# Patient Record
Sex: Male | Born: 1975 | Race: White | Hispanic: No | Marital: Married | Smoking: Current every day smoker
Health system: Southern US, Community
[De-identification: ages and names within clinical notes are randomized; demographics above are authoritative.]

## PROBLEM LIST (undated history)

## (undated) ENCOUNTER — Emergency Department (HOSPITAL_BASED_OUTPATIENT_CLINIC_OR_DEPARTMENT_OTHER): Payer: BLUE CROSS/BLUE SHIELD | Source: Home / Self Care

## (undated) DIAGNOSIS — K219 Gastro-esophageal reflux disease without esophagitis: Secondary | ICD-10-CM

## (undated) DIAGNOSIS — I10 Essential (primary) hypertension: Secondary | ICD-10-CM

## (undated) DIAGNOSIS — I471 Supraventricular tachycardia, unspecified: Secondary | ICD-10-CM

## (undated) DIAGNOSIS — I4891 Unspecified atrial fibrillation: Secondary | ICD-10-CM

## (undated) DIAGNOSIS — E119 Type 2 diabetes mellitus without complications: Secondary | ICD-10-CM

## (undated) DIAGNOSIS — IMO0001 Reserved for inherently not codable concepts without codable children: Secondary | ICD-10-CM

## (undated) DIAGNOSIS — N419 Inflammatory disease of prostate, unspecified: Secondary | ICD-10-CM

## (undated) DIAGNOSIS — Z87448 Personal history of other diseases of urinary system: Secondary | ICD-10-CM

## (undated) HISTORY — PX: TONSILLECTOMY: SUR1361

## (undated) HISTORY — PX: WISDOM TOOTH EXTRACTION: SHX21

## (undated) HISTORY — PX: KNEE SURGERY: SHX244

## (undated) HISTORY — PX: ADENOIDECTOMY: SUR15

## (undated) HISTORY — PX: URETER SURGERY: SHX823

---

## 2000-08-31 ENCOUNTER — Emergency Department (HOSPITAL_COMMUNITY): Admission: EM | Admit: 2000-08-31 | Discharge: 2000-08-31 | Payer: Self-pay | Admitting: Emergency Medicine

## 2000-08-31 ENCOUNTER — Encounter: Payer: Self-pay | Admitting: Emergency Medicine

## 2000-09-15 ENCOUNTER — Emergency Department (HOSPITAL_COMMUNITY): Admission: EM | Admit: 2000-09-15 | Discharge: 2000-09-15 | Payer: Self-pay | Admitting: Emergency Medicine

## 2000-10-23 ENCOUNTER — Emergency Department (HOSPITAL_COMMUNITY): Admission: EM | Admit: 2000-10-23 | Discharge: 2000-10-23 | Payer: Self-pay | Admitting: Internal Medicine

## 2000-10-23 ENCOUNTER — Encounter: Payer: Self-pay | Admitting: Internal Medicine

## 2012-02-14 ENCOUNTER — Encounter (HOSPITAL_BASED_OUTPATIENT_CLINIC_OR_DEPARTMENT_OTHER): Payer: Self-pay | Admitting: *Deleted

## 2012-02-14 ENCOUNTER — Emergency Department (HOSPITAL_BASED_OUTPATIENT_CLINIC_OR_DEPARTMENT_OTHER)
Admission: EM | Admit: 2012-02-14 | Discharge: 2012-02-14 | Disposition: A | Discharge status: Home / Self Care | Payer: Self-pay | Attending: Emergency Medicine | Admitting: Emergency Medicine

## 2012-02-14 DIAGNOSIS — Z880 Allergy status to penicillin: Secondary | ICD-10-CM | POA: Insufficient documentation

## 2012-02-14 DIAGNOSIS — K0889 Other specified disorders of teeth and supporting structures: Secondary | ICD-10-CM

## 2012-02-14 DIAGNOSIS — F17200 Nicotine dependence, unspecified, uncomplicated: Secondary | ICD-10-CM | POA: Insufficient documentation

## 2012-02-14 DIAGNOSIS — K089 Disorder of teeth and supporting structures, unspecified: Secondary | ICD-10-CM | POA: Insufficient documentation

## 2012-02-14 DIAGNOSIS — E119 Type 2 diabetes mellitus without complications: Secondary | ICD-10-CM | POA: Insufficient documentation

## 2012-02-14 DIAGNOSIS — R51 Headache: Secondary | ICD-10-CM | POA: Insufficient documentation

## 2012-02-14 MED ORDER — METHOCARBAMOL 500 MG PO TABS: 500.0000 mg | ORAL_TABLET | Freq: Two times a day (BID) | ORAL | Status: AC

## 2012-02-14 MED ORDER — OXYCODONE-ACETAMINOPHEN 5-325 MG PO TABS: 2.0000 | ORAL_TABLET | ORAL | Status: AC | PRN

## 2012-02-14 NOTE — ED Provider Notes (Signed)
History     CSN: [id number]  Arrival date & time 02/14/12  6578   First MD Initiated Contact with Patient 02/14/12 [phone]      Chief Complaint  Patient presents with  . Headache  . Dental Pain    (Consider location/radiation/quality/duration/timing/severity/associated sxs/prior treatment) Patient is a 36 y.o. male presenting with headaches and tooth pain. The history is provided by the patient and the spouse.  Headache   Dental PainThe primary symptoms include headaches.   in here complaining of pain to the right side of his face after being hit with a softball. No loss of consciousness no vomiting. Has not appropriately. Denies any neck pain complains of sharp pain at his right upper third molar which is worse with mastication. Using over-the-counter medications without resolve does have a history of poor dentition at that tooth as well  Past Medical History  Diagnosis Date  . Diabetes mellitus     History reviewed. No pertinent past surgical history.  History reviewed. No pertinent family history.  History  Substance Use Topics  . Smoking status: Current Everyday Smoker  . Smokeless tobacco: Not on file  . Alcohol Use: No      Review of Systems  Neurological: Positive for headaches.  All other systems reviewed and are negative.    Allergies  Penicillins  Home Medications  No current outpatient prescriptions on file.  BP 115/75  Pulse 92  Temp 97.6 F (36.4 C) (Oral)  Resp 20  Ht 5\' 9"  (1.753 m)  Wt 215 lb (97.523 kg)  BMI 31.75 kg/m2  SpO2 100%  Physical Exam  Nursing note and vitals reviewed. Constitutional: He is oriented to person, place, and time. He appears well-developed and well-nourished.  Non-toxic appearance. No distress.  HENT:  Head: Normocephalic and atraumatic.  Mouth/Throat: Dental caries present. No dental abscesses.  Eyes: Conjunctivae, EOM and lids are normal. Pupils are equal, round, and reactive to light.  Neck: Normal range of  motion. Neck supple. No tracheal deviation present. No mass present.  Cardiovascular: Normal rate, regular rhythm and normal heart sounds.  Exam reveals no gallop.   No murmur heard. Pulmonary/Chest: Effort normal and breath sounds normal. No stridor. No respiratory distress. He has no decreased breath sounds. He has no wheezes. He has no rhonchi. He has no rales.  Abdominal: Soft. Normal appearance and bowel sounds are normal. He exhibits no distension. There is no tenderness. There is no rebound and no CVA tenderness.  Musculoskeletal: Normal range of motion. He exhibits no edema and no tenderness.  Neurological: He is alert and oriented to person, place, and time. He has normal strength. No cranial nerve deficit or sensory deficit. GCS eye subscore is 4. GCS verbal subscore is 5. GCS motor subscore is 6.  Skin: Skin is warm and dry. No abrasion and no rash noted.  Psychiatric: He has a normal mood and affect. His speech is normal and behavior is normal.    ED Course  Procedures (including critical care time)  Labs Reviewed - No data to display No results found.   No diagnosis found.    MDM  Will tx for dental pain--no need for xrays        Toy Baker, MD 02/14/12 0930

## 2012-02-14 NOTE — ED Notes (Signed)
Hit in right side of face with softball last night states broke one of upper right molars and has had headache and facial pain with some swelling since then

## 2012-03-12 ENCOUNTER — Emergency Department (HOSPITAL_BASED_OUTPATIENT_CLINIC_OR_DEPARTMENT_OTHER)
Admission: EM | Admit: 2012-03-12 | Discharge: 2012-03-12 | Disposition: A | Discharge status: Home / Self Care | Payer: Self-pay | Attending: Emergency Medicine | Admitting: Emergency Medicine

## 2012-03-12 ENCOUNTER — Emergency Department (HOSPITAL_BASED_OUTPATIENT_CLINIC_OR_DEPARTMENT_OTHER): Payer: Self-pay

## 2012-03-12 ENCOUNTER — Encounter (HOSPITAL_BASED_OUTPATIENT_CLINIC_OR_DEPARTMENT_OTHER): Payer: Self-pay | Admitting: *Deleted

## 2012-03-12 DIAGNOSIS — M5430 Sciatica, unspecified side: Secondary | ICD-10-CM | POA: Insufficient documentation

## 2012-03-12 DIAGNOSIS — Z880 Allergy status to penicillin: Secondary | ICD-10-CM | POA: Insufficient documentation

## 2012-03-12 DIAGNOSIS — Z881 Allergy status to other antibiotic agents status: Secondary | ICD-10-CM | POA: Insufficient documentation

## 2012-03-12 DIAGNOSIS — E119 Type 2 diabetes mellitus without complications: Secondary | ICD-10-CM | POA: Insufficient documentation

## 2012-03-12 DIAGNOSIS — F17200 Nicotine dependence, unspecified, uncomplicated: Secondary | ICD-10-CM | POA: Insufficient documentation

## 2012-03-12 MED ORDER — OXYCODONE-ACETAMINOPHEN 5-325 MG PO TABS
1.0000 | ORAL_TABLET | Freq: Once | ORAL | Status: AC
  Administered 2012-03-12: 1 via ORAL
  Filled 2012-03-12 (×2): qty 1

## 2012-03-12 MED ORDER — IBUPROFEN 600 MG PO TABS: 600.0000 mg | ORAL_TABLET | Freq: Four times a day (QID) | ORAL | Status: DC | PRN

## 2012-03-12 MED ORDER — CYCLOBENZAPRINE HCL 10 MG PO TABS
5.0000 mg | ORAL_TABLET | Freq: Once | ORAL | Status: AC
Start: 2012-03-12 — End: 2012-03-12
  Administered 2012-03-12: 5 mg via ORAL
  Filled 2012-03-12: qty 1

## 2012-03-12 MED ORDER — CYCLOBENZAPRINE HCL 5 MG PO TABS: 5.0000 mg | ORAL_TABLET | Freq: Three times a day (TID) | ORAL | Status: DC | PRN

## 2012-03-12 MED ORDER — IBUPROFEN 800 MG PO TABS
800.0000 mg | ORAL_TABLET | Freq: Once | ORAL | Status: AC
  Administered 2012-03-12: 800 mg via ORAL
  Filled 2012-03-12: qty 1

## 2012-03-12 MED ORDER — OXYCODONE-ACETAMINOPHEN 5-325 MG PO TABS: 1.0000 | ORAL_TABLET | Freq: Four times a day (QID) | ORAL | Status: DC | PRN

## 2012-03-12 NOTE — ED Notes (Signed)
Pt c/o lower back pain x 4 weeks.  

## 2012-03-12 NOTE — ED Provider Notes (Signed)
History   This chart was scribed for Ethelda Chick, MD by Sofie Rower. The patient was seen in room MH08/MH08 and the patient's care was started at 7:48PM.    CSN: [id number]  Arrival date & time 03/12/12  1840   First MD Initiated Contact with Patient 03/12/12 1948      Chief Complaint  Patient presents with  . Back Pain    (Consider location/radiation/quality/duration/timing/severity/associated sxs/prior treatment) Patient is a 36 y.o. male presenting with back pain. The history is provided by the patient. No language interpreter was used.  Back Pain  This is a new problem. The current episode started more than 2 days ago. The problem occurs constantly. The problem has been gradually worsening. The pain is associated with no known injury. The pain is present in the lumbar spine. The quality of the pain is described as stabbing. The pain radiates to the left thigh, left knee, right thigh and right knee. The pain is moderate. The symptoms are aggravated by certain positions. The pain is the same all the time. Pertinent negatives include no fever, no dysuria and no weakness. He has tried NSAIDs for the symptoms. The treatment provided no relief.    Terrance Hodges is a 35 y.o. male who presents to the Emergency Department complaining of sudden, progressively worsening, back pain located at the lumbar spine, radiating downwards towards the lower extremities, onset four days ago. The pt reports the back pain he is experiencing is a sharp, shooting sensation in his lower back and lower extremities. Modifying factors include taking ibuprofen which does not provide relief of the back pain and sitting down for extended periods of time which intensifies the back pain. The pt has a hx of diabetes mellitus.  The pt denies any recent injuries, fever, difficulty urinating, and weakness in the lower extremities.   The pt is a current everyday smoker, however, he does not drink alcohol.      Past  Medical History  Diagnosis Date  . Diabetes mellitus     History reviewed. No pertinent past surgical history.  History reviewed. No pertinent family history.  History  Substance Use Topics  . Smoking status: Current Every Day Smoker  . Smokeless tobacco: Not on file  . Alcohol Use: No      Review of Systems  Constitutional: Negative for fever.  Genitourinary: Negative for dysuria.  Musculoskeletal: Positive for back pain.  Neurological: Negative for weakness.  All other systems reviewed and are negative.    Allergies  Clindamycin/lincomycin and Penicillins  Home Medications   Current Outpatient Rx  Name Route Sig Dispense Refill  . CYCLOBENZAPRINE HCL 5 MG PO TABS Oral Take 1 tablet (5 mg total) by mouth 3 (three) times daily as needed for muscle spasms. 20 tablet 0  . IBUPROFEN 600 MG PO TABS Oral Take 1 tablet (600 mg total) by mouth every 6 (six) hours as needed for pain. 30 tablet 0  . OXYCODONE-ACETAMINOPHEN 5-325 MG PO TABS Oral Take 1-2 tablets by mouth every 6 (six) hours as needed for pain. 15 tablet 0    Pulse 77  Temp 98.6 F (37 C)  Resp 16  Ht 5\' 10"  (1.778 m)  Wt 215 lb (97.523 kg)  BMI 30.85 kg/m2  SpO2 99%  Physical Exam  Nursing note and vitals reviewed. Constitutional: He is oriented to person, place, and time. He appears well-developed and well-nourished.  HENT:  Head: Atraumatic.  Right Ear: External ear normal.  Left Ear:  External ear normal.  Nose: Nose normal.  Eyes: Conjunctivae normal and EOM are normal.  Neck: Normal range of motion. Neck supple.  Cardiovascular: Normal rate, regular rhythm and normal heart sounds.   Pulmonary/Chest: Effort normal and breath sounds normal.  Abdominal: Soft.  Musculoskeletal: Normal range of motion. He exhibits tenderness.       Lumbar back: He exhibits tenderness.       Lumbar midline tenderness, full strength and sensation in lower extremities.   Neurological: He is alert and oriented to  person, place, and time.  Skin: Skin is warm and dry.  Psychiatric: He has a normal mood and affect. His behavior is normal.    ED Course  Procedures (including critical care time)  DIAGNOSTIC STUDIES: Oxygen Saturation is 99% on room air, normal by my interpretation.    COORDINATION OF CARE:    8:30PM- Pain management, muscle relaxer's, and x-ray of back discussed with patient. Pt agrees with treatment.   Labs Reviewed - No data to display No results found for this or any previous visit. Dg Lumbar Spine Complete  03/12/2012  *RADIOLOGY REPORT*  Clinical Data: Back pain  LUMBAR SPINE - COMPLETE 4+ VIEW  Comparison: 01/03/2010  Findings: Anatomic alignment.  No vertebral compression deformity. Disc height maintained.  IMPRESSION: No acute bony pathology.   Original Report Authenticated By: Donavan Burnet, M.D.       1. Sciatica       MDM  Pt presents with low back pain with radiation down right lower extremity.  Pain has been present over the past several weeks and worse over the past 4 days.  No fever, no weakness of legs, no urinary retention or incontinence or other signs of emergent cause of back pain.  Xray reassuring as well (images reviewed by me), pt treated with ibuprofen, pain meds, muscle relaxant.  Discharged with strict return precautions.  Pt agreeable with plan.      I personally performed the services described in this documentation, which was scribed in my presence. The recorded information has been reviewed and considered.    Ethelda Chick, MD 03/12/12 [phone]

## 2012-03-12 NOTE — ED Notes (Signed)
Report reced from [employer]

## 2012-03-17 ENCOUNTER — Encounter (HOSPITAL_BASED_OUTPATIENT_CLINIC_OR_DEPARTMENT_OTHER): Payer: Self-pay | Admitting: *Deleted

## 2012-03-17 ENCOUNTER — Emergency Department (HOSPITAL_BASED_OUTPATIENT_CLINIC_OR_DEPARTMENT_OTHER)
Admission: EM | Admit: 2012-03-17 | Discharge: 2012-03-17 | Disposition: A | Discharge status: Home / Self Care | Payer: Self-pay | Attending: Emergency Medicine | Admitting: Emergency Medicine

## 2012-03-17 DIAGNOSIS — N410 Acute prostatitis: Secondary | ICD-10-CM | POA: Insufficient documentation

## 2012-03-17 DIAGNOSIS — F17200 Nicotine dependence, unspecified, uncomplicated: Secondary | ICD-10-CM | POA: Insufficient documentation

## 2012-03-17 DIAGNOSIS — E119 Type 2 diabetes mellitus without complications: Secondary | ICD-10-CM | POA: Insufficient documentation

## 2012-03-17 LAB — CBC WITH DIFFERENTIAL/PLATELET
Eosinophils Relative: 1 % (ref 0–5)
HCT: 46.6 % (ref 39.0–52.0)
Lymphocytes Relative: 19 % (ref 12–46)
Lymphs Abs: 1.7 10*3/uL (ref 0.7–4.0)
MCV: 90.3 fL (ref 78.0–100.0)
Monocytes Absolute: 0.7 10*3/uL (ref 0.1–1.0)
Neutro Abs: 6.5 10*3/uL (ref 1.7–7.7)
Platelets: 261 10*3/uL (ref 150–400)
RBC: 5.16 MIL/uL (ref 4.22–5.81)
WBC: 8.9 10*3/uL (ref 4.0–10.5)

## 2012-03-17 LAB — URINALYSIS, ROUTINE W REFLEX MICROSCOPIC
Glucose, UA: 250 mg/dL — AB
Leukocytes, UA: NEGATIVE
Nitrite: NEGATIVE
Specific Gravity, Urine: 1.024 (ref 1.005–1.030)
pH: 6.5 (ref 5.0–8.0)

## 2012-03-17 LAB — BASIC METABOLIC PANEL
CO2: 25 mEq/L (ref 19–32)
Chloride: 103 mEq/L (ref 96–112)
GFR calc Af Amer: >90 mL/min (ref >90)
Sodium: 139 mEq/L (ref 135–145)

## 2012-03-17 LAB — URINE MICROSCOPIC-ADD ON

## 2012-03-17 MED ORDER — HYDROMORPHONE HCL PF 1 MG/ML IJ SOLN
1.0000 mg | Freq: Once | INTRAMUSCULAR | Status: AC
  Administered 2012-03-17: 1 mg via INTRAVENOUS
  Filled 2012-03-17: qty 1

## 2012-03-17 MED ORDER — OXYCODONE-ACETAMINOPHEN 5-325 MG PO TABS: 2.0000 | ORAL_TABLET | ORAL | Status: DC | PRN

## 2012-03-17 MED ORDER — KETOROLAC TROMETHAMINE 30 MG/ML IJ SOLN
30.0000 mg | Freq: Once | INTRAMUSCULAR | Status: AC
  Administered 2012-03-17: 30 mg via INTRAVENOUS
  Filled 2012-03-17: qty 1

## 2012-03-17 MED ORDER — TAMSULOSIN HCL 0.4 MG PO CAPS: 0.4000 mg | ORAL_CAPSULE | Freq: Every day | ORAL | Status: DC

## 2012-03-17 MED ORDER — KETOROLAC TROMETHAMINE 60 MG/2ML IM SOLN: 60.0000 mg | Freq: Once | INTRAMUSCULAR | Status: DC

## 2012-03-17 MED ORDER — HYDROMORPHONE HCL PF 2 MG/ML IJ SOLN
2.0000 mg | Freq: Once | INTRAMUSCULAR | Status: AC
  Administered 2012-03-17: 2 mg via INTRAVENOUS
  Filled 2012-03-17: qty 1

## 2012-03-17 MED ORDER — CIPROFLOXACIN HCL 500 MG PO TABS: 500.0000 mg | ORAL_TABLET | Freq: Two times a day (BID) | ORAL | Status: DC

## 2012-03-17 NOTE — ED Provider Notes (Signed)
History     CSN: [id number]  Arrival date & time 03/17/12  1351   First MD Initiated Contact with Patient 03/17/12 1420      Chief Complaint  Patient presents with  . Dysuria    (Consider location/radiation/quality/duration/timing/severity/associated sxs/prior treatment) HPI Comments: Patient is a 36 year old male with a past medical history of diabetes who presents with difficulty urinating for the past 10 days. Patient reports when he gets the urge to void he can only urinate a scant amount. He reports urinating twice in the past 3 days. He reports being seen 1 week ago for back pain and was diagnosed with sciatica. He has continued to have the same back pain with new onset dysuria. The pain is located in his lumbar area and radiates down his right leg. The pain is burning and severe. No aggravating/alleviating factors. No associated symptoms. Patient denies injury, fever, headache, chest pain, SOB, abdominal pain, abdominal distension, numbness/tingling, weakness of extremities, bowel/bladder incontinence.    Past Medical History  Diagnosis Date  . Diabetes mellitus     History reviewed. No pertinent past surgical history.  History reviewed. No pertinent family history.  History  Substance Use Topics  . Smoking status: Current Every Day Smoker  . Smokeless tobacco: Not on file  . Alcohol Use: No      Review of Systems  Genitourinary: Positive for difficulty urinating.  Musculoskeletal: Positive for back pain.  All other systems reviewed and are negative.    Allergies  Clindamycin/lincomycin and Penicillins  Home Medications   Current Outpatient Rx  Name Route Sig Dispense Refill  . CYCLOBENZAPRINE HCL 5 MG PO TABS Oral Take 1 tablet (5 mg total) by mouth 3 (three) times daily as needed for muscle spasms. 20 tablet 0  . IBUPROFEN 600 MG PO TABS Oral Take 1 tablet (600 mg total) by mouth every 6 (six) hours as needed for pain. 30 tablet 0  .  OXYCODONE-ACETAMINOPHEN 5-325 MG PO TABS Oral Take 1-2 tablets by mouth every 6 (six) hours as needed for pain. 15 tablet 0    BP 121/77  Pulse 120  Temp 98 F (36.7 C) (Oral)  Resp 20  Ht 5\' 10"  (1.778 m)  Wt 215 lb (97.523 kg)  BMI 30.85 kg/m2  SpO2 98%  Physical Exam  Nursing note and vitals reviewed. Constitutional: He is oriented to person, place, and time. He appears well-developed and well-nourished. No distress.  HENT:  Head: Normocephalic and atraumatic.  Mouth/Throat: No oropharyngeal exudate.  Eyes: Conjunctivae normal and EOM are normal. Pupils are equal, round, and reactive to light.  Neck: Normal range of motion. Neck supple.  Cardiovascular: Normal rate and regular rhythm.  Exam reveals no gallop and no friction rub.   No murmur heard. Pulmonary/Chest: Effort normal and breath sounds normal. He has no wheezes. He has no rales. He exhibits no tenderness.  Abdominal: Soft. There is no tenderness.  Musculoskeletal: Normal range of motion.       Lumbar spine tenderness to palpation. No step off. All other joint have full ROM without pain, edema, or tenderness to palpation.   Neurological: He is alert and oriented to person, place, and time. No cranial nerve deficit. Coordination normal.       Strength and sensation equal and intact bilaterally. Speech is goal-oriented. Moves limbs without ataxia.   Skin: Skin is warm and dry. He is not diaphoretic.  Psychiatric: He has a normal mood and affect. His behavior is normal.  ED Course  Procedures (including critical care time)  Labs Reviewed  URINALYSIS, ROUTINE W REFLEX MICROSCOPIC - Abnormal; Notable for the following:    Glucose, UA 250 (*)     Hgb urine dipstick MODERATE (*)     All other components within normal limits  BASIC METABOLIC PANEL - Abnormal; Notable for the following:    Glucose, Bld 114 (*)     All other components within normal limits  CBC WITH DIFFERENTIAL - Abnormal; Notable for the following:      Hemoglobin 17.2 (*)     MCHC 36.9 (*)     All other components within normal limits  URINE MICROSCOPIC-ADD ON   No results found.   1. Prostatitis, acute       MDM  2:43 PM Patient had emptied bladder and will have bladder ultrasound to evaluation of retention. Dr. Radford Pax calculated 140cc of urine retention with ultrasound.   4:19 PM Urology consulted. I spoke with Dr. Sherron Monday who recommended treating the patient as Prostatitis with 250mg  Cipro BID for 7 days and 0.4mg  Flomax daily for 30 days with refill. The patient should call [facility] on Monday for a follow up and return to the ED with worsening or concerning symptoms.   Patient's urine shows no infection and labs are unremarkable.   Patient can be discharged with pain medication for his persistent back pain and medications discussed above. No further evaluation needed at this time.   4:51 PM  Patient agreeable to plan discussed with urology. He complains of persistent back pain that is unrelieved with a total of 3mg  of dilaudid since being here. I will give him toradol as well.   5:28 PM  Patient will be discharged. Narcotic database look up shows multiple narcotic prescriptions in the past couple months from multiple providers.   Emilia Beck, PA-C 03/28/12 [phone]

## 2012-03-17 NOTE — ED Notes (Signed)
Pt states he has had problems urinating for about a week and a half. Here for back pain about a week ago. Dx'd with sciatica and given meds. Hx similar episode with dysuria several years ago "ureters collapsed"

## 2012-03-22 ENCOUNTER — Encounter (HOSPITAL_BASED_OUTPATIENT_CLINIC_OR_DEPARTMENT_OTHER): Payer: Self-pay

## 2012-03-22 ENCOUNTER — Emergency Department (HOSPITAL_BASED_OUTPATIENT_CLINIC_OR_DEPARTMENT_OTHER)
Admission: EM | Admit: 2012-03-22 | Discharge: 2012-03-22 | Disposition: A | Discharge status: Home / Self Care | Payer: Self-pay | Attending: Emergency Medicine | Admitting: Emergency Medicine

## 2012-03-22 DIAGNOSIS — F17200 Nicotine dependence, unspecified, uncomplicated: Secondary | ICD-10-CM | POA: Insufficient documentation

## 2012-03-22 DIAGNOSIS — M549 Dorsalgia, unspecified: Secondary | ICD-10-CM | POA: Insufficient documentation

## 2012-03-22 DIAGNOSIS — E119 Type 2 diabetes mellitus without complications: Secondary | ICD-10-CM | POA: Insufficient documentation

## 2012-03-22 HISTORY — DX: Inflammatory disease of prostate, unspecified: N41.9

## 2012-03-22 LAB — URINALYSIS, ROUTINE W REFLEX MICROSCOPIC
Bilirubin Urine: NEGATIVE
Ketones, ur: NEGATIVE mg/dL
Nitrite: NEGATIVE
Protein, ur: NEGATIVE mg/dL
Specific Gravity, Urine: 1.023 (ref 1.005–1.030)
Urobilinogen, UA: 0.2 mg/dL (ref 0.0–1.0)

## 2012-03-22 LAB — URINE MICROSCOPIC-ADD ON

## 2012-03-22 MED ORDER — OXYCODONE-ACETAMINOPHEN 5-325 MG PO TABS
2.0000 | ORAL_TABLET | Freq: Once | ORAL | Status: AC
  Administered 2012-03-22: 2 via ORAL
  Filled 2012-03-22 (×2): qty 2

## 2012-03-22 MED ORDER — OXYCODONE-ACETAMINOPHEN 5-325 MG PO TABS: 2.0000 | ORAL_TABLET | ORAL | Status: DC | PRN

## 2012-03-22 NOTE — ED Provider Notes (Signed)
History     CSN: [id number]  Arrival date & time 03/22/12  1420   First MD Initiated Contact with Patient 03/22/12 1523      Chief Complaint  Patient presents with  . Back Pain    (Consider location/radiation/quality/duration/timing/severity/associated sxs/prior treatment) HPI Comments: Pt c/o lower back pain, radiating down right leg.  Started about 2 months ago, has been worsening since then.  No numbness or weakness to extremities, other than occassional tingling to right 4th/5th digit.  No abd pain.  No incontinence.  No recent injury.  Has been seen in ED for his back in past, started on percocet and flexeril, has appt with neurology in November.  Is out of his percocet and says that pain is worse.  Also has a one week hx of difficulty urinating.  Says that he only urinates a small amount at a time.  No burning or pain on urination.  No new back pain.  No fevers.  No n/v/d.  No pain with bowel movements.  Was felt to have possible prostatitis on last visit, started on cipro which he is still taking.  Has appt with [facility] on Nov 4th.  He says these symptoms are unchanged from his last visit.  The reason he came in today was for his LBP.  Patient is a 36 y.o. male presenting with back pain. The history is provided by the patient.  Back Pain  Pertinent negatives include no chest pain, no fever, no numbness, no headaches, no abdominal pain and no weakness.    Past Medical History  Diagnosis Date  . Diabetes mellitus   . Prostatitis     Past Surgical History  Procedure Date  . Tonsillectomy   . Adenoidectomy   . Knee surgery     No family history on file.  History  Substance Use Topics  . Smoking status: Current Every Day Smoker  . Smokeless tobacco: Not on file  . Alcohol Use: No      Review of Systems  Constitutional: Negative for fever, chills, diaphoresis and fatigue.  HENT: Negative for congestion, rhinorrhea and sneezing.   Eyes: Negative.     Respiratory: Negative for cough, chest tightness and shortness of breath.   Cardiovascular: Negative for chest pain and leg swelling.  Gastrointestinal: Negative for nausea, vomiting, abdominal pain, diarrhea and blood in stool.  Genitourinary: Positive for decreased urine volume and difficulty urinating. Negative for frequency, hematuria and flank pain.  Musculoskeletal: Positive for back pain. Negative for arthralgias.  Skin: Negative for rash.  Neurological: Negative for dizziness, speech difficulty, weakness, numbness and headaches.    Allergies  Clindamycin/lincomycin and Penicillins  Home Medications   Current Outpatient Rx  Name Route Sig Dispense Refill  . CIPROFLOXACIN HCL 500 MG PO TABS Oral Take 1 tablet (500 mg total) by mouth every 12 (twelve) hours. 20 tablet 0  . CYCLOBENZAPRINE HCL 5 MG PO TABS Oral Take 1 tablet (5 mg total) by mouth 3 (three) times daily as needed for muscle spasms. 20 tablet 0  . IBUPROFEN 600 MG PO TABS Oral Take 1 tablet (600 mg total) by mouth every 6 (six) hours as needed for pain. 30 tablet 0  . OXYCODONE-ACETAMINOPHEN 5-325 MG PO TABS Oral Take 1-2 tablets by mouth every 6 (six) hours as needed for pain. 15 tablet 0  . OXYCODONE-ACETAMINOPHEN 5-325 MG PO TABS Oral Take 2 tablets by mouth every 4 (four) hours as needed for pain. 10 tablet 0  . OXYCODONE-ACETAMINOPHEN 5-325  MG PO TABS Oral Take 2 tablets by mouth every 4 (four) hours as needed for pain. 15 tablet 0  . TAMSULOSIN HCL 0.4 MG PO CAPS Oral Take 1 capsule (0.4 mg total) by mouth daily. 30 capsule 3    BP 131/80  Pulse 108  Temp 98.3 F (36.8 C) (Oral)  Resp 20  Ht 5\' 10"  (1.778 m)  Wt 215 lb (97.523 kg)  BMI 30.85 kg/m2  SpO2 98%  Physical Exam  Constitutional: He is oriented to person, place, and time. He appears well-developed and well-nourished.  HENT:  Head: Normocephalic and atraumatic.  Eyes: Pupils are equal, round, and reactive to light.  Neck: Normal range of  motion. Neck supple.  Cardiovascular: Normal rate, regular rhythm and normal heart sounds.   Pulmonary/Chest: Effort normal and breath sounds normal. No respiratory distress. He has no wheezes. He has no rales. He exhibits no tenderness.  Abdominal: Soft. Bowel sounds are normal. There is no tenderness. There is no rebound and no guarding.  Musculoskeletal: Normal range of motion. He exhibits no edema.       TTP lower right lumbar musculature and paraspinal area  Lymphadenopathy:    He has no cervical adenopathy.  Neurological: He is alert and oriented to person, place, and time. He has normal strength. No sensory deficit.       Negative SLR bilaterally, patellar reflexes symmetric  Skin: Skin is warm and dry. No rash noted.  Psychiatric: He has a normal mood and affect.    ED Course  Procedures (including critical care time)  Results for orders placed during the hospital encounter of 03/22/12  URINALYSIS, ROUTINE W REFLEX MICROSCOPIC      Component Value Range   Color, Urine YELLOW  YELLOW   APPearance CLEAR  CLEAR   Specific Gravity, Urine 1.023  1.005 - 1.030   pH 5.5  5.0 - 8.0   Glucose, UA NEGATIVE  NEGATIVE mg/dL   Hgb urine dipstick MODERATE (*) NEGATIVE   Bilirubin Urine NEGATIVE  NEGATIVE   Ketones, ur NEGATIVE  NEGATIVE mg/dL   Protein, ur NEGATIVE  NEGATIVE mg/dL   Urobilinogen, UA 0.2  0.0 - 1.0 mg/dL   Nitrite NEGATIVE  NEGATIVE   Leukocytes, UA NEGATIVE  NEGATIVE  URINE MICROSCOPIC-ADD ON      Component Value Range   Squamous Epithelial / LPF RARE  RARE   RBC / HPF 7-10  <3 RBC/hpf   Bacteria, UA RARE  RARE   Dg Lumbar Spine Complete  03/12/2012  *RADIOLOGY REPORT*  Clinical Data: Back pain  LUMBAR SPINE - COMPLETE 4+ VIEW  Comparison: 01/03/2010  Findings: Anatomic alignment.  No vertebral compression deformity. Disc height maintained.  IMPRESSION: No acute bony pathology.   Original Report Authenticated By: Donavan Burnet, M.D.       1. Back pain        MDM  Pt with LBP, has f/u appts with neuro and urology.  U/s does not show a distended bladder, no evidence of retention.  No UTI.  +microscopic hematuria, but exam/symptoms not consistent with renal colic, pain seems radicular and has been there for about 2 months.  Hematuria can be rechecked at urology appt        Rolan Bucco, MD 03/22/12 1630

## 2012-03-22 NOTE — ED Notes (Signed)
C/o cont'd back pain with decreased UO-reports last voided yesterday am-currently being tx for prostatitis-has urology 11/4-was advised to return to ED if s/s worsen

## 2012-03-22 NOTE — ED Notes (Signed)
No bladder scan.  Bedside US at bedside now.

## 2012-03-29 NOTE — ED Provider Notes (Signed)
Medical screening examination/treatment/procedure(s) were performed by non-physician practitioner and as supervising physician I was immediately available for consultation/collaboration.    Trenell Concannon L Keelin Neville, MD 03/29/12 0105 

## 2012-04-02 ENCOUNTER — Encounter (HOSPITAL_BASED_OUTPATIENT_CLINIC_OR_DEPARTMENT_OTHER): Payer: Self-pay | Admitting: *Deleted

## 2012-04-02 DIAGNOSIS — N419 Inflammatory disease of prostate, unspecified: Secondary | ICD-10-CM | POA: Insufficient documentation

## 2012-04-02 DIAGNOSIS — Z792 Long term (current) use of antibiotics: Secondary | ICD-10-CM | POA: Insufficient documentation

## 2012-04-02 DIAGNOSIS — Z4801 Encounter for change or removal of surgical wound dressing: Secondary | ICD-10-CM | POA: Insufficient documentation

## 2012-04-02 DIAGNOSIS — Z4802 Encounter for removal of sutures: Secondary | ICD-10-CM | POA: Insufficient documentation

## 2012-04-02 DIAGNOSIS — E119 Type 2 diabetes mellitus without complications: Secondary | ICD-10-CM | POA: Insufficient documentation

## 2012-04-02 DIAGNOSIS — F17200 Nicotine dependence, unspecified, uncomplicated: Secondary | ICD-10-CM | POA: Insufficient documentation

## 2012-04-02 DIAGNOSIS — Z79899 Other long term (current) drug therapy: Secondary | ICD-10-CM | POA: Insufficient documentation

## 2012-04-02 NOTE — ED Notes (Signed)
Pain in his right 2nd digit. He had sutures 2 days ago and now he has drainage from the suture site.

## 2012-04-03 ENCOUNTER — Emergency Department (HOSPITAL_BASED_OUTPATIENT_CLINIC_OR_DEPARTMENT_OTHER)
Admission: EM | Admit: 2012-04-03 | Discharge: 2012-04-03 | Disposition: A | Discharge status: Home / Self Care | Payer: Self-pay | Attending: Emergency Medicine | Admitting: Emergency Medicine

## 2012-04-03 DIAGNOSIS — IMO0002 Reserved for concepts with insufficient information to code with codable children: Secondary | ICD-10-CM

## 2012-04-03 MED ORDER — DOXYCYCLINE HYCLATE 100 MG PO CAPS: 100.0000 mg | ORAL_CAPSULE | Freq: Two times a day (BID) | ORAL | Status: DC

## 2012-04-03 MED ORDER — OXYCODONE-ACETAMINOPHEN 5-325 MG PO TABS: 1.0000 | ORAL_TABLET | Freq: Four times a day (QID) | ORAL | Status: DC | PRN

## 2012-04-03 NOTE — ED Notes (Signed)
Pt reports cutting his index finger Saturday and going to [facility] ED and getting 3 stitches placed. States that he was prescribed Lortab which is not helping with the finger pain. Reports bottom stitch has green-yellow drainage. Reports finger pain 10/10.

## 2012-04-03 NOTE — ED Provider Notes (Signed)
History   This chart was scribed for Hanley Seamen, MD by Albertha Ghee Rifaie. This patient was seen in room MH08/MH08 and the patient's care was started at 12:10 AM.    CSN: [id number]  Arrival date & time 04/02/12  2228   None     Chief Complaint  Patient presents with  . Wound Infection    The history is provided by the patient and the spouse. No language interpreter was used.    Terrance Hodges is a 36 y.o. male who presents to the Emergency Department complaining of 2 and 1/2 days of gradual onset, gradually worsening, sever right 2nd digit pain due to wound infection that is associated with drainage (greenish, yellowish puss) of the suture site. Pain is described as throbbing and is aggravated with movement of the right fingers. He states he had sutures 2 and 1/2 days ago. Pt's wife reports pt was taking his prescribed pain medication with no relief and he stopped it and then he started taking ibuprofen for pain and swelling with moderate relief. Pt denies having fever, chills, nausea, and emesis. Pt is a current everyday smoker but denies alcohol use.   Past Medical History  Diagnosis Date  . Diabetes mellitus   . Prostatitis     Past Surgical History  Procedure Date  . Tonsillectomy   . Adenoidectomy   . Knee surgery     No family history on file.  History  Substance Use Topics  . Smoking status: Current Every Day Smoker -- 0.5 packs/day    Types: Cigarettes  . Smokeless tobacco: Not on file  . Alcohol Use: No      Review of Systems  All other systems reviewed and are negative.    Allergies  Clindamycin/lincomycin and Penicillins  Home Medications   Current Outpatient Rx  Name Route Sig Dispense Refill  . HYDROCODONE-ACETAMINOPHEN 5-325 MG PO TABS Oral Take 1 tablet by mouth every 6 (six) hours as needed.    . SULFAMETHOXAZOLE-TRIMETHOPRIM 800-160 MG PO TABS Oral Take 1 tablet by mouth 2 (two) times daily.    [name] CIPROFLOXACIN HCL 500 MG PO TABS Oral  Take 1 tablet (500 mg total) by mouth every 12 (twelve) hours. 20 tablet 0  . CYCLOBENZAPRINE HCL 5 MG PO TABS Oral Take 1 tablet (5 mg total) by mouth 3 (three) times daily as needed for muscle spasms. 20 tablet 0  . IBUPROFEN 600 MG PO TABS Oral Take 1 tablet (600 mg total) by mouth every 6 (six) hours as needed for pain. 30 tablet 0  . OXYCODONE-ACETAMINOPHEN 5-325 MG PO TABS Oral Take 1-2 tablets by mouth every 6 (six) hours as needed for pain. 15 tablet 0  . OXYCODONE-ACETAMINOPHEN 5-325 MG PO TABS Oral Take 2 tablets by mouth every 4 (four) hours as needed for pain. 10 tablet 0  . OXYCODONE-ACETAMINOPHEN 5-325 MG PO TABS Oral Take 2 tablets by mouth every 4 (four) hours as needed for pain. 15 tablet 0  . TAMSULOSIN HCL 0.4 MG PO CAPS Oral Take 1 capsule (0.4 mg total) by mouth daily. 30 capsule 3    BP 133/65  Pulse 85  Temp 97.8 F (36.6 C) (Oral)  Resp 20  SpO2 100%  Physical Exam  Constitutional: He is oriented to person, place, and time. He appears well-developed and well-nourished.  HENT:  Head: Normocephalic and atraumatic.  Eyes: Conjunctivae normal and EOM are normal. Pupils are equal, round, and reactive to light.  Neck: Normal range  of motion. Neck supple.  Cardiovascular: Normal rate, regular rhythm and normal heart sounds.   Pulmonary/Chest: Effort normal and breath sounds normal.  Abdominal: Soft. He exhibits no distension. There is no tenderness.  Musculoskeletal:       Right hand has suture laceration over the thenar aspect of the right sec PIP joint  Decreased sensation distal to the wound with moderate tenderness of the wound. No drainage seen although there is a hx of drainage  No erythema Mild approximal edema   Neurological: He is alert and oriented to person, place, and time.    ED Course  Procedures (including critical care time)  DIAGNOSTIC STUDIES: Oxygen Saturation is 100% on room air, normal by my interpretation.    COORDINATION OF CARE: 12:08  AM Discussed treatment plan with pt at bedside and pt agreed to plan.  SUTURE REMOVAL Performed by: Paula Libra L  Consent: Verbal consent obtained. Consent given by: patient Required items: required blood products, implants, devices, and special equipment available Time out: Immediately prior to procedure a "time out" was called to verify the correct patient, procedure, equipment, support staff and site/side marked as required.  Location: Right index finger  Wound Appearance: clean  Sutures/Staples Removed: 3   Patient tolerance: Patient tolerated the procedure well with no immediate complications.      MDM  The well-healed appearance of the patient's wound does not appear consistent with his stated history of having it sutured 2-1/2 days ago. The lack of redness, warmth, lymphangitis or significant edema are not consistent with a wound infection. We will however prescribe doxycycline as the Bactrim may be an adequately treating a nascent infection. The decreased sensation distal to the wound is consistent with a digital nerve injury based on the location of the wound; the patient's pain may be due to a direct nerve injury. We'll have him return in 24-48 hours should his symptoms worsen.      I personally performed the services described in this documentation, which was scribed in my presence.  The recorded information has been reviewed and considered.    Hanley Seamen, MD 04/03/12 [phone]

## 2012-04-04 ENCOUNTER — Emergency Department (HOSPITAL_BASED_OUTPATIENT_CLINIC_OR_DEPARTMENT_OTHER)
Admission: EM | Admit: 2012-04-04 | Discharge: 2012-04-04 | Disposition: A | Discharge status: Home / Self Care | Payer: Self-pay | Attending: Emergency Medicine | Admitting: Emergency Medicine

## 2012-04-04 ENCOUNTER — Encounter (HOSPITAL_BASED_OUTPATIENT_CLINIC_OR_DEPARTMENT_OTHER): Payer: Self-pay | Admitting: Emergency Medicine

## 2012-04-04 DIAGNOSIS — Z4800 Encounter for change or removal of nonsurgical wound dressing: Secondary | ICD-10-CM | POA: Insufficient documentation

## 2012-04-04 DIAGNOSIS — Z87828 Personal history of other (healed) physical injury and trauma: Secondary | ICD-10-CM

## 2012-04-04 DIAGNOSIS — Z792 Long term (current) use of antibiotics: Secondary | ICD-10-CM | POA: Insufficient documentation

## 2012-04-04 DIAGNOSIS — Z79899 Other long term (current) drug therapy: Secondary | ICD-10-CM | POA: Insufficient documentation

## 2012-04-04 DIAGNOSIS — N419 Inflammatory disease of prostate, unspecified: Secondary | ICD-10-CM | POA: Insufficient documentation

## 2012-04-04 DIAGNOSIS — F17200 Nicotine dependence, unspecified, uncomplicated: Secondary | ICD-10-CM | POA: Insufficient documentation

## 2012-04-04 DIAGNOSIS — E119 Type 2 diabetes mellitus without complications: Secondary | ICD-10-CM | POA: Insufficient documentation

## 2012-04-04 NOTE — ED Provider Notes (Signed)
History     CSN: [id number]  Arrival date & time 04/04/12  1912   First MD Initiated Contact with Patient 04/04/12 2053      Chief Complaint  Patient presents with  . Wound Check    (Consider location/radiation/quality/duration/timing/severity/associated sxs/prior treatment) HPI Comments: Patient is a 36 year old male who presents for a wound check of a finger laceration of right first digit. The patient reports subjective healing of the laceration and denies any complications except thinking the wound might be "coming apart". The patient denies any pain, bleeding, erythema, tenderness, drainage of the site. Denies fever, NVD, abdominal pain, numbness/tingling, skin color changes.   Patient is a 36 y.o. male presenting with wound check.  Wound Check     Past Medical History  Diagnosis Date  . Diabetes mellitus   . Prostatitis     Past Surgical History  Procedure Date  . Tonsillectomy   . Adenoidectomy   . Knee surgery     No family history on file.  History  Substance Use Topics  . Smoking status: Current Every Day Smoker -- 0.5 packs/day    Types: Cigarettes  . Smokeless tobacco: Not on file  . Alcohol Use: No      Review of Systems  Skin: Positive for wound.  All other systems reviewed and are negative.    Allergies  Clindamycin/lincomycin and Penicillins  Home Medications   Current Outpatient Rx  Name Route Sig Dispense Refill  . DOXYCYCLINE HYCLATE 100 MG PO CAPS Oral Take 1 capsule (100 mg total) by mouth 2 (two) times daily. 14 capsule 0  . CIPROFLOXACIN HCL 500 MG PO TABS Oral Take 1 tablet (500 mg total) by mouth every 12 (twelve) hours. 20 tablet 0  . CYCLOBENZAPRINE HCL 5 MG PO TABS Oral Take 1 tablet (5 mg total) by mouth 3 (three) times daily as needed for muscle spasms. 20 tablet 0  . IBUPROFEN 600 MG PO TABS Oral Take 1 tablet (600 mg total) by mouth every 6 (six) hours as needed for pain. 30 tablet 0  . OXYCODONE-ACETAMINOPHEN 5-325 MG  PO TABS Oral Take 1-2 tablets by mouth every 6 (six) hours as needed for pain. 12 tablet 0  . TAMSULOSIN HCL 0.4 MG PO CAPS Oral Take 1 capsule (0.4 mg total) by mouth daily. 30 capsule 3    BP 127/64  Pulse 82  Temp 99.2 F (37.3 C) (Oral)  Resp 16  Ht 5\' 10"  (1.778 m)  Wt 215 lb (97.523 kg)  BMI 30.85 kg/m2  SpO2 98%  Physical Exam  Nursing note and vitals reviewed. Constitutional: He appears well-developed and well-nourished. No distress.  HENT:  Head: Normocephalic and atraumatic.  Eyes: Conjunctivae normal are normal.  Neck: Normal range of motion. Neck supple.  Cardiovascular: Normal rate and regular rhythm.  Exam reveals no gallop and no friction rub.   No murmur heard. Pulmonary/Chest: Effort normal and breath sounds normal. He has no wheezes. He has no rales. He exhibits no tenderness.  Abdominal: Soft. He exhibits no distension.  Musculoskeletal: Normal range of motion.       Full ROM of affected finger.   Neurological: He is alert. Coordination normal.       Strength and sensation equal and intact bilaterally. Speech is goal-oriented. Moves limbs without ataxia.   Skin: Skin is warm and dry.       1 cm healed laceration of right first digit. No surrounding erythema, edema or tenderness.   Psychiatric:  He has a normal mood and affect. His behavior is normal.    ED Course  Procedures (including critical care time)  Labs Reviewed - No data to display No results found.   1. H/O laceration of skin       MDM  9:10 PM Patient concerned his laceration was reopening. Laceration does not appear to need sutures. Bacitracin applied and a bandaid. Patient instructed to keep wound covered and return with signs of infection. No further evaluation needed at this time.        Emilia Beck, [place] 04/04/12 2117

## 2012-04-04 NOTE — ED Notes (Signed)
PA at bedside.

## 2012-04-04 NOTE — ED Notes (Signed)
Pt presents for recheck of wound on right index finger.  Sutures were removed here 2 days ago when pt presented because he had some pus coming out of one end of the wound. Sts he was told that if it "split open" or got worse to come back and "it is both."  Sts he split it open yesterday and today and it bled both times.  Sts he has pain in entire finger.  No obvious redness. Minimal swelling.

## 2012-04-05 NOTE — ED Provider Notes (Signed)
Medical screening examination/treatment/procedure(s) were performed by non-physician practitioner and as supervising physician I was immediately available for consultation/collaboration.   Joya Gaskins, MD 04/05/12 1048

## 2012-05-16 ENCOUNTER — Encounter (HOSPITAL_BASED_OUTPATIENT_CLINIC_OR_DEPARTMENT_OTHER): Payer: Self-pay | Admitting: *Deleted

## 2012-05-16 ENCOUNTER — Emergency Department (HOSPITAL_BASED_OUTPATIENT_CLINIC_OR_DEPARTMENT_OTHER)
Admission: EM | Admit: 2012-05-16 | Discharge: 2012-05-16 | Disposition: A | Discharge status: Home / Self Care | Payer: Self-pay | Attending: Emergency Medicine | Admitting: Emergency Medicine

## 2012-05-16 DIAGNOSIS — Z79899 Other long term (current) drug therapy: Secondary | ICD-10-CM | POA: Insufficient documentation

## 2012-05-16 DIAGNOSIS — E119 Type 2 diabetes mellitus without complications: Secondary | ICD-10-CM | POA: Insufficient documentation

## 2012-05-16 DIAGNOSIS — Z87448 Personal history of other diseases of urinary system: Secondary | ICD-10-CM | POA: Insufficient documentation

## 2012-05-16 DIAGNOSIS — M549 Dorsalgia, unspecified: Secondary | ICD-10-CM

## 2012-05-16 DIAGNOSIS — H109 Unspecified conjunctivitis: Secondary | ICD-10-CM | POA: Insufficient documentation

## 2012-05-16 DIAGNOSIS — F17200 Nicotine dependence, unspecified, uncomplicated: Secondary | ICD-10-CM | POA: Insufficient documentation

## 2012-05-16 DIAGNOSIS — M545 Low back pain, unspecified: Secondary | ICD-10-CM | POA: Insufficient documentation

## 2012-05-16 MED ORDER — ERYTHROMYCIN 5 MG/GM OP OINT
TOPICAL_OINTMENT | Freq: Four times a day (QID) | OPHTHALMIC | Status: DC
  Administered 2012-05-16: 20:00:00 via OPHTHALMIC
  Filled 2012-05-16: qty 3.5

## 2012-05-16 MED ORDER — TETRACAINE HCL 0.5 % OP SOLN
2.0000 [drp] | Freq: Once | OPHTHALMIC | Status: AC
  Administered 2012-05-16: 2 [drp] via OPHTHALMIC
  Filled 2012-05-16: qty 2

## 2012-05-16 MED ORDER — HYDROCODONE-ACETAMINOPHEN 5-325 MG PO TABS: 2.0000 | ORAL_TABLET | ORAL | Status: DC | PRN

## 2012-05-16 MED ORDER — FLUORESCEIN SODIUM 1 MG OP STRP
1.0000 | ORAL_STRIP | Freq: Once | OPHTHALMIC | Status: AC
  Administered 2012-05-16: 20:00:00 via OPHTHALMIC
  Filled 2012-05-16: qty 1

## 2012-05-16 NOTE — ED Provider Notes (Signed)
History     CSN: [id number]  Arrival date & time 05/16/12  1914   First MD Initiated Contact with Patient 05/16/12 1927      Chief Complaint  Patient presents with  . Eye Drainage    (Consider location/radiation/quality/duration/timing/severity/associated sxs/prior treatment) HPI Comments: Pt states that for 1 day he has developed right eye redness and drainage:pt denies wearing contacts:pt states that he feels like he has something in his eye:no known timing with it:pt states that he is not having blurred vision:pt also c/o lower back pain which he has as an ongoing problem:pt denies numbness weakness or dysuria   The history is provided by the patient. No language interpreter was used.    Past Medical History  Diagnosis Date  . Diabetes mellitus   . Prostatitis     Past Surgical History  Procedure Date  . Tonsillectomy   . Adenoidectomy   . Knee surgery     History reviewed. No pertinent family history.  History  Substance Use Topics  . Smoking status: Current Every Day Smoker -- 0.5 packs/day    Types: Cigarettes  . Smokeless tobacco: Not on file  . Alcohol Use: No      Review of Systems  Constitutional: Negative.   Respiratory: Negative.   Cardiovascular: Negative.     Allergies  Clindamycin/lincomycin; Penicillins; and Pineapple  Home Medications   Current Outpatient Rx  Name  Route  Sig  Dispense  Refill  . CIPROFLOXACIN HCL 500 MG PO TABS   Oral   Take 1 tablet (500 mg total) by mouth every 12 (twelve) hours.   20 tablet   0   . CYCLOBENZAPRINE HCL 5 MG PO TABS   Oral   Take 1 tablet (5 mg total) by mouth 3 (three) times daily as needed for muscle spasms.   20 tablet   0   . DOXYCYCLINE HYCLATE 100 MG PO CAPS   Oral   Take 1 capsule (100 mg total) by mouth 2 (two) times daily.   14 capsule   0   . IBUPROFEN 600 MG PO TABS   Oral   Take 1 tablet (600 mg total) by mouth every 6 (six) hours as needed for pain.   30 tablet   0   .  OXYCODONE-ACETAMINOPHEN 5-325 MG PO TABS   Oral   Take 1-2 tablets by mouth every 6 (six) hours as needed for pain.   12 tablet   0   . TAMSULOSIN HCL 0.4 MG PO CAPS   Oral   Take 1 capsule (0.4 mg total) by mouth daily.   30 capsule   3     BP 133/80  Pulse 90  Temp 98.2 F (36.8 C) (Oral)  Resp 18  Ht 5\' 10"  (1.778 m)  Wt 215 lb (97.523 kg)  BMI 30.85 kg/m2  SpO2 98%  Physical Exam  Nursing note and vitals reviewed. Constitutional: He is oriented to person, place, and time. He appears well-developed and well-nourished.  HENT:  Right Ear: External ear normal.  Left Ear: External ear normal.  Mouth/Throat: Oropharynx is clear and moist.  Eyes: EOM are normal. Pupils are equal, round, and reactive to light. Right eye exhibits exudate. Right conjunctiva is injected.  Slit lamp exam:      The right eye shows no corneal abrasion and no fluorescein uptake.  Cardiovascular: Normal rate and regular rhythm.   Pulmonary/Chest: Effort normal and breath sounds normal.  Musculoskeletal: Normal range of motion.  Pt has generalized tenderness to lower back:strength equal  Neurological: He is alert and oriented to person, place, and time.  Skin: Skin is warm and dry.  Psychiatric: He has a normal mood and affect.    ED Course  Procedures (including critical care time)  Labs Reviewed - No data to display No results found.   1. Conjunctivitis   2. Back pain       MDM  No sign of fb in eye:pt treated for conjunctivitis        Teressa Lower, NP 05/16/12 1958

## 2012-05-16 NOTE — ED Notes (Signed)
Pt c/o right eye drainage x1 day

## 2012-05-16 NOTE — ED Provider Notes (Signed)
Medical screening examination/treatment/procedure(s) were performed by non-physician practitioner and as supervising physician I was immediately available for consultation/collaboration.   Glynn Octave, MD 05/16/12 2025

## 2012-06-02 ENCOUNTER — Encounter (HOSPITAL_BASED_OUTPATIENT_CLINIC_OR_DEPARTMENT_OTHER): Payer: Self-pay | Admitting: *Deleted

## 2012-06-02 ENCOUNTER — Emergency Department (HOSPITAL_BASED_OUTPATIENT_CLINIC_OR_DEPARTMENT_OTHER)
Admission: EM | Admit: 2012-06-02 | Discharge: 2012-06-02 | Disposition: A | Discharge status: Home / Self Care | Payer: Self-pay | Attending: Emergency Medicine | Admitting: Emergency Medicine

## 2012-06-02 DIAGNOSIS — E119 Type 2 diabetes mellitus without complications: Secondary | ICD-10-CM | POA: Insufficient documentation

## 2012-06-02 DIAGNOSIS — K219 Gastro-esophageal reflux disease without esophagitis: Secondary | ICD-10-CM | POA: Insufficient documentation

## 2012-06-02 DIAGNOSIS — Z79899 Other long term (current) drug therapy: Secondary | ICD-10-CM | POA: Insufficient documentation

## 2012-06-02 DIAGNOSIS — Z791 Long term (current) use of non-steroidal anti-inflammatories (NSAID): Secondary | ICD-10-CM | POA: Insufficient documentation

## 2012-06-02 DIAGNOSIS — F17200 Nicotine dependence, unspecified, uncomplicated: Secondary | ICD-10-CM | POA: Insufficient documentation

## 2012-06-02 DIAGNOSIS — L739 Follicular disorder, unspecified: Secondary | ICD-10-CM

## 2012-06-02 DIAGNOSIS — IMO0002 Reserved for concepts with insufficient information to code with codable children: Secondary | ICD-10-CM | POA: Insufficient documentation

## 2012-06-02 DIAGNOSIS — W230XXA Caught, crushed, jammed, or pinched between moving objects, initial encounter: Secondary | ICD-10-CM | POA: Insufficient documentation

## 2012-06-02 DIAGNOSIS — Y939 Activity, unspecified: Secondary | ICD-10-CM | POA: Insufficient documentation

## 2012-06-02 DIAGNOSIS — N419 Inflammatory disease of prostate, unspecified: Secondary | ICD-10-CM | POA: Insufficient documentation

## 2012-06-02 DIAGNOSIS — L738 Other specified follicular disorders: Secondary | ICD-10-CM | POA: Insufficient documentation

## 2012-06-02 DIAGNOSIS — S30812A Abrasion of penis, initial encounter: Secondary | ICD-10-CM

## 2012-06-02 DIAGNOSIS — Y929 Unspecified place or not applicable: Secondary | ICD-10-CM | POA: Insufficient documentation

## 2012-06-02 HISTORY — DX: Reserved for inherently not codable concepts without codable children: IMO0001

## 2012-06-02 HISTORY — DX: Gastro-esophageal reflux disease without esophagitis: K21.9

## 2012-06-02 MED ORDER — HYDROCODONE-ACETAMINOPHEN 5-325 MG PO TABS: 2.0000 | ORAL_TABLET | Freq: Four times a day (QID) | ORAL | Status: DC | PRN

## 2012-06-02 MED ORDER — CEPHALEXIN 500 MG PO CAPS: 500.0000 mg | ORAL_CAPSULE | Freq: Four times a day (QID) | ORAL | Status: DC

## 2012-06-02 NOTE — ED Notes (Signed)
Patient states he zipped his penis up in his zipper of his pants and has a tear on his penis.  States he used neosporin and now has a rash on his upper left thigh and perineal area.

## 2012-06-02 NOTE — ED Provider Notes (Signed)
History     CSN: [id number]  Arrival date & time 06/02/12  0909   First MD Initiated Contact with Patient 06/02/12 [phone]      Chief Complaint  Patient presents with  . Rash    (Consider location/radiation/quality/duration/timing/severity/associated sxs/prior treatment) HPI Pt reports several days ago he accidentally caught the shaft of his penis in the zipper as he was zipping his pants. He reports a superficial skin tear that he has been treating with neosporin. Over the last several days he has noticed bumps on his inner thigh and pubic region, mild-moderate pain and itching but no significant swelling or drainage. No fever at home.  Past Medical History  Diagnosis Date  . Diabetes mellitus   . Prostatitis   . Reflux     Past Surgical History  Procedure Date  . Tonsillectomy   . Adenoidectomy   . Knee surgery     No family history on file.  History  Substance Use Topics  . Smoking status: Current Every Day Smoker -- 0.5 packs/day    Types: Cigarettes  . Smokeless tobacco: Not on file  . Alcohol Use: No      Review of Systems All other systems reviewed and are negative except as noted in HPI.   Allergies  Clindamycin/lincomycin; Penicillins; and Pineapple  Home Medications   Current Outpatient Rx  Name  Route  Sig  Dispense  Refill  . CIPROFLOXACIN HCL 500 MG PO TABS   Oral   Take 1 tablet (500 mg total) by mouth every 12 (twelve) hours.   20 tablet   0   . CYCLOBENZAPRINE HCL 5 MG PO TABS   Oral   Take 1 tablet (5 mg total) by mouth 3 (three) times daily as needed for muscle spasms.   20 tablet   0   . DOXYCYCLINE HYCLATE 100 MG PO CAPS   Oral   Take 1 capsule (100 mg total) by mouth 2 (two) times daily.   14 capsule   0   . HYDROCODONE-ACETAMINOPHEN 5-325 MG PO TABS   Oral   Take 2 tablets by mouth every 4 (four) hours as needed for pain.   10 tablet   0   . IBUPROFEN 600 MG PO TABS   Oral   Take 1 tablet (600 mg total) by mouth every  6 (six) hours as needed for pain.   30 tablet   0   . OXYCODONE-ACETAMINOPHEN 5-325 MG PO TABS   Oral   Take 1-2 tablets by mouth every 6 (six) hours as needed for pain.   12 tablet   0   . TAMSULOSIN HCL 0.4 MG PO CAPS   Oral   Take 1 capsule (0.4 mg total) by mouth daily.   30 capsule   3     BP 137/86  Pulse 111  Temp 97.8 F (36.6 C) (Oral)  Resp 16  Ht 5\' 10"  (1.778 m)  Wt 210 lb (95.255 kg)  BMI 30.13 kg/m2  SpO2 99%  Physical Exam  Nursing note and vitals reviewed. Constitutional: He is oriented to person, place, and time. He appears well-developed and well-nourished.  HENT:  Head: Normocephalic and atraumatic.  Eyes: EOM are normal. Pupils are equal, round, and reactive to light.  Neck: Normal range of motion. Neck supple.  Cardiovascular: Normal rate, normal heart sounds and intact distal pulses.   Pulmonary/Chest: Effort normal and breath sounds normal.  Abdominal: Bowel sounds are normal. He exhibits no distension. There is no  tenderness.  Genitourinary:       Healing abrasion to L shaft of penis without drainage, there are several other areas of folliculitis in the surrounding pubic and upper thigh areas, no abscess  Musculoskeletal: Normal range of motion. He exhibits no edema and no tenderness.  Neurological: He is alert and oriented to person, place, and time. He has normal strength. No cranial nerve deficit or sensory deficit.  Skin: Skin is warm and dry. No rash noted.  Psychiatric: He has a normal mood and affect.    ED Course  Procedures (including critical care time)  Labs Reviewed - No data to display No results found.   1. Abrasion of penis   2. Folliculitis       MDM  Will start oral Keflex, advised warm compresses.        Charles B. Bernette Mayers, MD 06/02/12 [phone]

## 2012-07-01 ENCOUNTER — Encounter (HOSPITAL_BASED_OUTPATIENT_CLINIC_OR_DEPARTMENT_OTHER): Payer: Self-pay | Admitting: *Deleted

## 2012-07-01 ENCOUNTER — Emergency Department (HOSPITAL_BASED_OUTPATIENT_CLINIC_OR_DEPARTMENT_OTHER)
Admission: EM | Admit: 2012-07-01 | Discharge: 2012-07-01 | Disposition: A | Discharge status: Home / Self Care | Payer: Self-pay | Attending: Emergency Medicine | Admitting: Emergency Medicine

## 2012-07-01 ENCOUNTER — Emergency Department (HOSPITAL_BASED_OUTPATIENT_CLINIC_OR_DEPARTMENT_OTHER): Payer: Self-pay

## 2012-07-01 DIAGNOSIS — S0003XA Contusion of scalp, initial encounter: Secondary | ICD-10-CM | POA: Insufficient documentation

## 2012-07-01 DIAGNOSIS — Z8719 Personal history of other diseases of the digestive system: Secondary | ICD-10-CM | POA: Insufficient documentation

## 2012-07-01 DIAGNOSIS — S0083XA Contusion of other part of head, initial encounter: Secondary | ICD-10-CM

## 2012-07-01 DIAGNOSIS — Y929 Unspecified place or not applicable: Secondary | ICD-10-CM | POA: Insufficient documentation

## 2012-07-01 DIAGNOSIS — E119 Type 2 diabetes mellitus without complications: Secondary | ICD-10-CM | POA: Insufficient documentation

## 2012-07-01 DIAGNOSIS — H7190 Unspecified cholesteatoma, unspecified ear: Secondary | ICD-10-CM | POA: Insufficient documentation

## 2012-07-01 DIAGNOSIS — S39012A Strain of muscle, fascia and tendon of lower back, initial encounter: Secondary | ICD-10-CM

## 2012-07-01 DIAGNOSIS — S79919A Unspecified injury of unspecified hip, initial encounter: Secondary | ICD-10-CM | POA: Insufficient documentation

## 2012-07-01 DIAGNOSIS — S79929A Unspecified injury of unspecified thigh, initial encounter: Secondary | ICD-10-CM | POA: Insufficient documentation

## 2012-07-01 DIAGNOSIS — Y9389 Activity, other specified: Secondary | ICD-10-CM | POA: Insufficient documentation

## 2012-07-01 DIAGNOSIS — R55 Syncope and collapse: Secondary | ICD-10-CM | POA: Insufficient documentation

## 2012-07-01 DIAGNOSIS — Z87448 Personal history of other diseases of urinary system: Secondary | ICD-10-CM | POA: Insufficient documentation

## 2012-07-01 DIAGNOSIS — K029 Dental caries, unspecified: Secondary | ICD-10-CM | POA: Insufficient documentation

## 2012-07-01 DIAGNOSIS — IMO0002 Reserved for concepts with insufficient information to code with codable children: Secondary | ICD-10-CM | POA: Insufficient documentation

## 2012-07-01 DIAGNOSIS — F17200 Nicotine dependence, unspecified, uncomplicated: Secondary | ICD-10-CM | POA: Insufficient documentation

## 2012-07-01 DIAGNOSIS — W010XXA Fall on same level from slipping, tripping and stumbling without subsequent striking against object, initial encounter: Secondary | ICD-10-CM | POA: Insufficient documentation

## 2012-07-01 MED ORDER — HYDROMORPHONE HCL PF 1 MG/ML IJ SOLN
1.0000 mg | Freq: Once | INTRAMUSCULAR | Status: AC
  Administered 2012-07-01: 1 mg via INTRAMUSCULAR
  Filled 2012-07-01: qty 1

## 2012-07-01 MED ORDER — ONDANSETRON 4 MG PO TBDP
4.0000 mg | ORAL_TABLET | Freq: Once | ORAL | Status: AC
  Administered 2012-07-01: 4 mg via ORAL
  Filled 2012-07-01: qty 1

## 2012-07-01 MED ORDER — OXYCODONE-ACETAMINOPHEN 5-325 MG PO TABS
2.0000 | ORAL_TABLET | Freq: Once | ORAL | Status: AC
  Administered 2012-07-01: 2 via ORAL
  Filled 2012-07-01 (×2): qty 2

## 2012-07-01 MED ORDER — CYCLOBENZAPRINE HCL 10 MG PO TABS: 10.0000 mg | ORAL_TABLET | Freq: Two times a day (BID) | ORAL | Status: DC | PRN

## 2012-07-01 MED ORDER — TRAMADOL HCL 50 MG PO TABS: 50.0000 mg | ORAL_TABLET | Freq: Four times a day (QID) | ORAL | Status: DC | PRN

## 2012-07-01 NOTE — ED Provider Notes (Signed)
History   This chart was scribed for Rolan Bucco, MD by Donne Anon, ED Scribe. This patient was seen in room MH03/MH03 and the patient's care was started at 2113.   CSN: [id number]  Arrival date & time 07/01/12  1622   First MD Initiated Contact with Patient 07/01/12 2113      Chief Complaint  Patient presents with  . Fall     The history is provided by the patient. No language interpreter was used.   JAHIEM FRANZONI is a 37 y.o. male who presents to the Emergency Department complaining of sudden onset, constant facial pain that is due to a fall that occurred today when he tripped and fell into a tree branch. He reports [name] at the time of the fall. He reports associated nausea that has since resolved. He also reports associated right hip pain and mild back pain. He denies emesis, abdominal pain, or any other pain.  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   Past Medical History  Diagnosis Date  . Diabetes mellitus   . Prostatitis   . Reflux     Past Surgical History  Procedure Date  . Tonsillectomy   . Adenoidectomy   . Knee surgery     History reviewed. No pertinent family history.  History  Substance Use Topics  . Smoking status: Current Every Day Smoker -- 0.5 packs/day    Types: Cigarettes  . Smokeless tobacco: Not on file  . Alcohol Use: No      Review of Systems  Constitutional: Negative for fever, chills, diaphoresis and fatigue.  HENT: Negative for congestion, rhinorrhea and sneezing.   Eyes: Negative.   Respiratory: Negative for cough, chest tightness and shortness of breath.   Cardiovascular: Negative for chest pain and leg swelling.  Gastrointestinal: Negative for nausea, vomiting, abdominal pain, diarrhea and blood in stool.  Genitourinary: Negative for frequency, hematuria, flank pain and difficulty urinating.  Musculoskeletal: Positive for back pain and arthralgias.  Skin: Negative for rash.       Positive for facial pain.    Neurological: Positive for syncope. Negative for dizziness, speech difficulty, weakness, numbness and headaches.    Allergies  Clindamycin/lincomycin; Penicillins; and Pineapple  Home Medications   Current Outpatient Rx  Name  Route  Sig  Dispense  Refill  . CEPHALEXIN 500 MG PO CAPS   Oral   Take 1 capsule (500 mg total) by mouth 4 (four) times daily.   28 capsule   0   . CYCLOBENZAPRINE HCL 10 MG PO TABS   Oral   Take 1 tablet (10 mg total) by mouth 2 (two) times daily as needed for muscle spasms.   20 tablet   0   . HYDROCODONE-ACETAMINOPHEN 5-325 MG PO TABS   Oral   Take 2 tablets by mouth every 6 (six) hours as needed for pain.   30 tablet   0   . IBUPROFEN 600 MG PO TABS   Oral   Take 1 tablet (600 mg total) by mouth every 6 (six) hours as needed for pain.   30 tablet   0   . TRAMADOL HCL 50 MG PO TABS   Oral   Take 1 tablet (50 mg total) by mouth every 6 (six) hours as needed for pain.   15 tablet   0     Triage Vitals; BP 133/85  Pulse 92  Temp 98.2 F (36.8 C) (Oral)  Resp 18  Ht 5\' 10"  (  1.778 m)  Wt 212 lb (96.163 kg)  BMI 30.42 kg/m2  SpO2 100%  Physical Exam  Constitutional: He is oriented to person, place, and time. He appears well-developed and well-nourished.  HENT:  Head: Normocephalic and atraumatic.       Tenderness to palpitation of right maxilla. No swelling, deformity, or ecchymosis.  +dental caries.  Eyes: Pupils are equal, round, and reactive to light.  Neck: Normal range of motion. Neck supple.  Cardiovascular: Normal rate, regular rhythm and normal heart sounds.   Pulmonary/Chest: Effort normal and breath sounds normal. No respiratory distress. He has no wheezes. He has no rales. He exhibits no tenderness.       No signs of external trauma to chest or abdomen.  Abdominal: Soft. Bowel sounds are normal. There is no tenderness. There is no rebound and no guarding.  Musculoskeletal: Normal range of motion. He exhibits no edema.        No tenderness to cervical spine. Mild tenderness to thoracic and lower lumbar spine. No step offs or deformities. No pain on palpitation with ROM on extremities.   Lymphadenopathy:    He has no cervical adenopathy.  Neurological: He is alert and oriented to person, place, and time. He has normal strength. No cranial nerve deficit or sensory deficit. GCS eye subscore is 4. GCS verbal subscore is 5. GCS motor subscore is 6.  Skin: Skin is warm and dry. No rash noted.  Psychiatric: He has a normal mood and affect.    ED Course  Procedures (including critical care time) DIAGNOSTIC STUDIES: Oxygen Saturation is 100% on room air, normal by my interpretation.    COORDINATION OF CARE: 9:28 PM Discussed treatment plan which includes back x ray, CT scan and medication with pt at bedside and pt agreed to plan.    Results for orders placed during the hospital encounter of 03/22/12  URINALYSIS, ROUTINE W REFLEX MICROSCOPIC      Component Value Range   Color, Urine YELLOW  YELLOW   APPearance CLEAR  CLEAR   Specific Gravity, Urine 1.023  1.005 - 1.030   pH 5.5  5.0 - 8.0   Glucose, UA NEGATIVE  NEGATIVE mg/dL   Hgb urine dipstick MODERATE (*) NEGATIVE   Bilirubin Urine NEGATIVE  NEGATIVE   Ketones, ur NEGATIVE  NEGATIVE mg/dL   Protein, ur NEGATIVE  NEGATIVE mg/dL   Urobilinogen, UA 0.2  0.0 - 1.0 mg/dL   Nitrite NEGATIVE  NEGATIVE   Leukocytes, UA NEGATIVE  NEGATIVE  URINE MICROSCOPIC-ADD ON      Component Value Range   Squamous Epithelial / LPF RARE  RARE   RBC / HPF 7-10  <3 RBC/hpf   Bacteria, UA RARE  RARE   Dg Thoracic Spine 2 View  07/01/2012  *RADIOLOGY REPORT*  Clinical Data: Fall, back pain  THORACIC SPINE - 2 VIEW  Comparison: None.  Findings: Three views of thoracic spine submitted.  No acute fracture or subluxation.  No radiopaque foreign body.  IMPRESSION: No acute fracture or subluxation.   Original Report Authenticated By: Natasha Mead, M.D.    Dg Lumbar Spine  Complete  07/01/2012  *RADIOLOGY REPORT*  Clinical Data: Fall, back pain.  LUMBAR SPINE - COMPLETE 4+ VIEW  Comparison: Plain films lumbar spine 03/12/2012 and CT abdomen and pelvis 01/03/2010.  Findings: Vertebral body height and alignment are normal.  No pars interarticularis defect is identified.  Intervertebral disc space height is maintained.  IMPRESSION: Negative exam.   Original Report Authenticated By:  Holley Dexter, M.D.    Ct Head Wo Contrast  07/01/2012  *RADIOLOGY REPORT*  Clinical Data: Status post fall with a blow to the right side of the head.  CT HEAD WITHOUT CONTRAST  Technique:  Contiguous axial images were obtained from the base of the skull through the vertex without contrast.  Comparison: None.  Findings: The brain appears normal without infarct, hemorrhage, mass lesion, mass effect, midline shift or abnormal extra-axial fluid collection.  No hydrocephalus or pneumocephalus.  Calvarium intact.  Imaged paranasal sinuses and mastoid air cells are clear.  IMPRESSION: Normal study.   Original Report Authenticated By: Holley Dexter, M.D.    Ct Maxillofacial Wo Cm  07/01/2012  *RADIOLOGY REPORT*  Clinical Data: Fall.  Right infraorbital pain and swelling.  CT MAXILLOFACIAL WITHOUT CONTRAST  Technique:  Multidetector CT imaging of the maxillofacial structures was performed. Multiplanar CT image reconstructions were also generated.  Comparison: None.  Findings: No intraorbital abnormality is observed.  The globes appear symmetric.  Paranasal sinuses appear clear.  Suspected cavities along the mesial surface of tooth #1, the mesial surface of tooth #18, and the distal surface of tooth #28. Probable small cavities along mesial and distal margins of the tooth #7 and in the mesial margin of tooth #8.  Speckled high density is present and side of a external auditory canal structure on the left which may have some inferior bony scalloping, query external auditory canal cholesteatoma.  No facial  fracture observed.  IMPRESSION:  1.  Multiple dental cavities detailed above.  No facial fracture. 2.  Calcification and possible bony erosion along a left external auditory canal density, raising the possibility of EAC cholesteatoma.   Original Report Authenticated By: Gaylyn Rong, M.D.        1. Facial contusion   2. Back strain   3. Dental caries       MDM  Pt with no evidence of fracture.  No visible injury to face.  Pt has had 10 opioid prescriptions filled in last 4 months.  Advised pt I could prescribe ultram and flexeril.  Advised to return if symptoms worsen.  Given referral to dentist.  I personally performed the services described in this documentation, which was scribed in my presence.  The recorded information has been reviewed and considered.         Rolan Bucco, MD 07/01/12 [phone]

## 2012-07-01 NOTE — ED Notes (Signed)
Alerted by registration that patient is requesting additional pain medicine. Triage RN and Charge RN made aware

## 2012-07-01 NOTE — ED Notes (Signed)
Pt states he was playing with his kids and tripped over a log. Now c/o facial and back pain. Placed in collar and PA notified and to triage to evaluate. Ice applied. Orders given. C-collar removed by her.

## 2012-07-01 NOTE — ED Provider Notes (Signed)
MSE was initiated and I personally evaluated the patient and placed orders (if any) at  5:00 PM on July 01, 2012.  The patient appears stable so that the remainder of the MSE may be completed by another provider.  Pt to ER c/o facial pain s/p fall. Pain located on left mandible, L inferior orbit and thoracic spine. No neck pain. Imaging and pain medication ordered. Ice applied to pts face.   Jaci Carrel, [place] 07/01/12 1703

## 2012-07-02 NOTE — ED Provider Notes (Signed)
History/physical exam/procedure(s) were performed by non-physician practitioner and as supervising physician I was immediately available for consultation/collaboration. I have reviewed all notes and am in agreement with care and plan.   Hilario Quarry, MD 07/02/12 [phone]

## 2012-08-01 ENCOUNTER — Encounter (HOSPITAL_BASED_OUTPATIENT_CLINIC_OR_DEPARTMENT_OTHER): Payer: Self-pay | Admitting: *Deleted

## 2012-08-01 ENCOUNTER — Emergency Department (HOSPITAL_BASED_OUTPATIENT_CLINIC_OR_DEPARTMENT_OTHER)
Admission: EM | Admit: 2012-08-01 | Discharge: 2012-08-01 | Disposition: A | Discharge status: Home / Self Care | Payer: Self-pay | Attending: Emergency Medicine | Admitting: Emergency Medicine

## 2012-08-01 DIAGNOSIS — E119 Type 2 diabetes mellitus without complications: Secondary | ICD-10-CM | POA: Insufficient documentation

## 2012-08-01 DIAGNOSIS — Z87448 Personal history of other diseases of urinary system: Secondary | ICD-10-CM | POA: Insufficient documentation

## 2012-08-01 DIAGNOSIS — F17200 Nicotine dependence, unspecified, uncomplicated: Secondary | ICD-10-CM | POA: Insufficient documentation

## 2012-08-01 DIAGNOSIS — Z8719 Personal history of other diseases of the digestive system: Secondary | ICD-10-CM | POA: Insufficient documentation

## 2012-08-01 DIAGNOSIS — L738 Other specified follicular disorders: Secondary | ICD-10-CM | POA: Insufficient documentation

## 2012-08-01 DIAGNOSIS — L739 Follicular disorder, unspecified: Secondary | ICD-10-CM

## 2012-08-01 DIAGNOSIS — Z792 Long term (current) use of antibiotics: Secondary | ICD-10-CM | POA: Insufficient documentation

## 2012-08-01 MED ORDER — CEPHALEXIN 500 MG PO CAPS: 500.0000 mg | ORAL_CAPSULE | Freq: Four times a day (QID) | ORAL | Status: DC

## 2012-08-01 NOTE — ED Notes (Signed)
Pt with bump to left occipital side of head that appeared 4 days ago, pt has attempted to squeeze area with no drainage, area has gotten bigger and painful, pt has history of bumps of head, last one several months ago that had to be drained.

## 2012-08-01 NOTE — ED Provider Notes (Signed)
History     CSN: [id number]  Arrival date & time 08/01/12  1942   First MD Initiated Contact with Patient 08/01/12 2032      Chief Complaint  Patient presents with  . Recurrent Skin Infections    (Consider location/radiation/quality/duration/timing/severity/associated sxs/prior treatment) HPI Comments: Pt states that he has noticed an area to the left posterior scalp:pt states that the the bump is tender:pt denies any drainage:no fever  The history is provided by the patient. No language interpreter was used.    Past Medical History  Diagnosis Date  . Diabetes mellitus   . Prostatitis   . Reflux     Past Surgical History  Procedure Laterality Date  . Tonsillectomy    . Adenoidectomy    . Knee surgery      No family history on file.  History  Substance Use Topics  . Smoking status: Current Every Day Smoker -- 0.50 packs/day    Types: Cigarettes  . Smokeless tobacco: Not on file  . Alcohol Use: No      Review of Systems  Constitutional: Negative.   Respiratory: Negative.   Cardiovascular: Negative.     Allergies  Clindamycin/lincomycin; Penicillins; and Pineapple  Home Medications   Current Outpatient Rx  Name  Route  Sig  Dispense  Refill  . cephALEXin (KEFLEX) 500 MG capsule   Oral   Take 1 capsule (500 mg total) by mouth 4 (four) times daily.   28 capsule   0   . cephALEXin (KEFLEX) 500 MG capsule   Oral   Take 1 capsule (500 mg total) by mouth 4 (four) times daily.   28 capsule   0   . cyclobenzaprine (FLEXERIL) 10 MG tablet   Oral   Take 1 tablet (10 mg total) by mouth 2 (two) times daily as needed for muscle spasms.   20 tablet   0   . HYDROcodone-acetaminophen (NORCO/VICODIN) 5-325 MG per tablet   Oral   Take 2 tablets by mouth every 6 (six) hours as needed for pain.   30 tablet   0   . ibuprofen (ADVIL,MOTRIN) 600 MG tablet   Oral   Take 1 tablet (600 mg total) by mouth every 6 (six) hours as needed for pain.   30 tablet    0   . traMADol (ULTRAM) 50 MG tablet   Oral   Take 1 tablet (50 mg total) by mouth every 6 (six) hours as needed for pain.   15 tablet   0     There were no vitals taken for this visit.  Physical Exam  Nursing note and vitals reviewed. Constitutional: He is oriented to person, place, and time. He appears well-developed and well-nourished.  Eyes: Conjunctivae and EOM are normal.  Cardiovascular: Normal rate and regular rhythm.   Pulmonary/Chest: Effort normal.  Musculoskeletal: Normal range of motion.  Neurological: He is alert and oriented to person, place, and time.  Skin:  Pt has multiple small pustules on the left posterior scalp    ED Course  Procedures (including critical care time)  Labs Reviewed - No data to display No results found.   1. Folliculitis       MDM  Nothing to I&D at this time:will treat for folliculitis        Teressa Lower, NP 08/01/12 2221

## 2012-08-01 NOTE — ED Notes (Signed)
Patient small boil to the back of his head. Started four days ago

## 2012-08-02 NOTE — ED Provider Notes (Signed)
History/physical exam/procedure(s) were performed by non-physician practitioner and as supervising physician I was immediately available for consultation/collaboration. I have reviewed all notes and am in agreement with care and plan.   Hilario Quarry, MD 08/02/12 [phone]

## 2012-08-09 ENCOUNTER — Encounter (HOSPITAL_BASED_OUTPATIENT_CLINIC_OR_DEPARTMENT_OTHER): Payer: Self-pay | Admitting: Family Medicine

## 2012-08-09 ENCOUNTER — Emergency Department (HOSPITAL_BASED_OUTPATIENT_CLINIC_OR_DEPARTMENT_OTHER)
Admission: EM | Admit: 2012-08-09 | Discharge: 2012-08-09 | Disposition: A | Discharge status: Home / Self Care | Payer: Self-pay | Attending: Emergency Medicine | Admitting: Emergency Medicine

## 2012-08-09 DIAGNOSIS — E119 Type 2 diabetes mellitus without complications: Secondary | ICD-10-CM | POA: Insufficient documentation

## 2012-08-09 DIAGNOSIS — F17200 Nicotine dependence, unspecified, uncomplicated: Secondary | ICD-10-CM | POA: Insufficient documentation

## 2012-08-09 DIAGNOSIS — Z87448 Personal history of other diseases of urinary system: Secondary | ICD-10-CM | POA: Insufficient documentation

## 2012-08-09 DIAGNOSIS — R509 Fever, unspecified: Secondary | ICD-10-CM | POA: Insufficient documentation

## 2012-08-09 DIAGNOSIS — K029 Dental caries, unspecified: Secondary | ICD-10-CM | POA: Insufficient documentation

## 2012-08-09 DIAGNOSIS — Z8719 Personal history of other diseases of the digestive system: Secondary | ICD-10-CM | POA: Insufficient documentation

## 2012-08-09 DIAGNOSIS — R220 Localized swelling, mass and lump, head: Secondary | ICD-10-CM | POA: Insufficient documentation

## 2012-08-09 MED ORDER — OXYCODONE-ACETAMINOPHEN 5-325 MG PO TABS: 2.0000 | ORAL_TABLET | ORAL | Status: DC | PRN

## 2012-08-09 NOTE — ED Provider Notes (Signed)
History     CSN: [id number]  Arrival date & time 08/09/12  4696   First MD Initiated Contact with Patient 08/09/12 0825      Chief Complaint  Patient presents with  . Dental Pain    (Consider location/radiation/quality/duration/timing/severity/associated sxs/prior treatment) HPI Comments: The patient presents with dental pain stating that he wants a referral to a dentist and that if he comes here first, his dental care is "free".  Patient is a 37 y.o. Hodges presenting with tooth pain. The history is provided by the patient.  Dental PainThe primary symptoms include mouth pain and fever. Primary symptoms do not include dental injury. The symptoms began 3 to 5 days ago. The symptoms are worsening. The symptoms are recurrent. The symptoms occur constantly.  Additional symptoms include: dental sensitivity to temperature, gum swelling and gum tenderness.    Past Medical History  Diagnosis Date  . Diabetes mellitus   . Prostatitis   . Reflux     Past Surgical History  Procedure Laterality Date  . Tonsillectomy    . Adenoidectomy    . Knee surgery    . Wisdom tooth extraction      No family history on file.  History  Substance Use Topics  . Smoking status: Current Every Day Smoker -- 0.50 packs/day    Types: Cigarettes  . Smokeless tobacco: Not on file  . Alcohol Use: No      Review of Systems  Constitutional: Positive for fever.  All other systems reviewed and are negative.    Allergies  Clindamycin/lincomycin; Penicillins; and Pineapple  Home Medications   Current Outpatient Rx  Name  Route  Sig  Dispense  Refill  . cephALEXin (KEFLEX) 500 MG capsule   Oral   Take 1 capsule (500 mg total) by mouth 4 (four) times daily.   28 capsule   0   . cephALEXin (KEFLEX) 500 MG capsule   Oral   Take 1 capsule (500 mg total) by mouth 4 (four) times daily.   28 capsule   0   . cyclobenzaprine (FLEXERIL) 10 MG tablet   Oral   Take 1 tablet (10 mg total) by mouth 2  (two) times daily as needed for muscle spasms.   20 tablet   0   . HYDROcodone-acetaminophen (NORCO/VICODIN) 5-325 MG per tablet   Oral   Take 2 tablets by mouth every 6 (six) hours as needed for pain.   30 tablet   0   . ibuprofen (ADVIL,MOTRIN) 600 MG tablet   Oral   Take 1 tablet (600 mg total) by mouth every 6 (six) hours as needed for pain.   30 tablet   0   . traMADol (ULTRAM) 50 MG tablet   Oral   Take 1 tablet (50 mg total) by mouth every 6 (six) hours as needed for pain.   15 tablet   0     BP 109/93  Pulse 115  Temp(Src) 97.7 F (36.5 C) (Oral)  Resp 22  Ht 5\' 10"  (1.778 m)  Wt 215 lb (97.523 kg)  BMI 30.85 kg/m2  SpO2 97%  Physical Exam  Nursing note and vitals reviewed. Constitutional: He appears well-developed and well-nourished. No distress.  HENT:  Head: Normocephalic and atraumatic.  Patient with multiple caries.  There is heavy decay to the left lower molar with surrounding gingival inflammation.  Skin: He is not diaphoretic.    ED Course  Procedures (including critical care time)  Labs Reviewed - No data  to display No results found.   No diagnosis found.    MDM  Will treat his pain and provide with the name of the dentist he was sent to the last time his dental work was "free".  Already on bactrim.  Also of note is that this will be the 8th prescription for narcotics for this patient in the last 6 months.        Geoffery Lyons, MD 08/09/12 [phone]

## 2012-08-09 NOTE — ED Notes (Signed)
Pt c/o left lower dental pain x 3 days. Pt is currently taking Bactrim for different issue.

## 2012-09-30 ENCOUNTER — Emergency Department (HOSPITAL_BASED_OUTPATIENT_CLINIC_OR_DEPARTMENT_OTHER)
Admission: EM | Admit: 2012-09-30 | Discharge: 2012-09-30 | Disposition: A | Discharge status: Home / Self Care | Payer: Self-pay | Attending: Emergency Medicine | Admitting: Emergency Medicine

## 2012-09-30 ENCOUNTER — Encounter (HOSPITAL_BASED_OUTPATIENT_CLINIC_OR_DEPARTMENT_OTHER): Payer: Self-pay

## 2012-09-30 DIAGNOSIS — H53149 Visual discomfort, unspecified: Secondary | ICD-10-CM | POA: Insufficient documentation

## 2012-09-30 DIAGNOSIS — Z8719 Personal history of other diseases of the digestive system: Secondary | ICD-10-CM | POA: Insufficient documentation

## 2012-09-30 DIAGNOSIS — Z87448 Personal history of other diseases of urinary system: Secondary | ICD-10-CM | POA: Insufficient documentation

## 2012-09-30 DIAGNOSIS — M549 Dorsalgia, unspecified: Secondary | ICD-10-CM | POA: Insufficient documentation

## 2012-09-30 DIAGNOSIS — R059 Cough, unspecified: Secondary | ICD-10-CM | POA: Insufficient documentation

## 2012-09-30 DIAGNOSIS — H5789 Other specified disorders of eye and adnexa: Secondary | ICD-10-CM | POA: Insufficient documentation

## 2012-09-30 DIAGNOSIS — M542 Cervicalgia: Secondary | ICD-10-CM | POA: Insufficient documentation

## 2012-09-30 DIAGNOSIS — F17200 Nicotine dependence, unspecified, uncomplicated: Secondary | ICD-10-CM | POA: Insufficient documentation

## 2012-09-30 DIAGNOSIS — Z79899 Other long term (current) drug therapy: Secondary | ICD-10-CM | POA: Insufficient documentation

## 2012-09-30 DIAGNOSIS — R509 Fever, unspecified: Secondary | ICD-10-CM | POA: Insufficient documentation

## 2012-09-30 DIAGNOSIS — Z792 Long term (current) use of antibiotics: Secondary | ICD-10-CM | POA: Insufficient documentation

## 2012-09-30 DIAGNOSIS — R05 Cough: Secondary | ICD-10-CM | POA: Insufficient documentation

## 2012-09-30 DIAGNOSIS — J029 Acute pharyngitis, unspecified: Secondary | ICD-10-CM | POA: Insufficient documentation

## 2012-09-30 DIAGNOSIS — R51 Headache: Secondary | ICD-10-CM | POA: Insufficient documentation

## 2012-09-30 DIAGNOSIS — J019 Acute sinusitis, unspecified: Secondary | ICD-10-CM

## 2012-09-30 DIAGNOSIS — E119 Type 2 diabetes mellitus without complications: Secondary | ICD-10-CM | POA: Insufficient documentation

## 2012-09-30 DIAGNOSIS — R110 Nausea: Secondary | ICD-10-CM | POA: Insufficient documentation

## 2012-09-30 LAB — GLUCOSE, CAPILLARY: Glucose-Capillary: 108 mg/dL — ABNORMAL HIGH (ref 70–99)

## 2012-09-30 MED ORDER — IBUPROFEN 600 MG PO TABS: 600.0000 mg | ORAL_TABLET | Freq: Four times a day (QID) | ORAL | Status: DC | PRN

## 2012-09-30 MED ORDER — PSEUDOEPHEDRINE HCL 60 MG PO TABS: 60.0000 mg | ORAL_TABLET | Freq: Four times a day (QID) | ORAL | Status: DC | PRN

## 2012-09-30 MED ORDER — AZITHROMYCIN 250 MG PO TABS: ORAL_TABLET | ORAL | Status: DC

## 2012-09-30 NOTE — ED Notes (Signed)
Pt states that he has sinus infection, uri symptoms x3 days.  Taking tylenol with no relief.

## 2012-09-30 NOTE — ED Provider Notes (Signed)
History     CSN: [id number]  Arrival date & time 09/30/12  1049   First MD Initiated Contact with Patient 09/30/12 1235      Chief Complaint  Patient presents with  . Sinus Problem    (Consider location/radiation/quality/duration/timing/severity/associated sxs/prior treatment) Patient is a 37 y.o. male presenting with sinus complaint. The history is provided by the patient.  Sinus Problem This is a new problem. The current episode started in the past 7 days. The problem occurs constantly. The problem has been gradually worsening. Associated symptoms include congestion, coughing, a fever (low grade), headaches, nausea, neck pain and a sore throat. Pertinent negatives include no abdominal pain, chest pain, chills, diaphoresis, myalgias, rash, vomiting or weakness. Nothing aggravates the symptoms. He has tried acetaminophen for the symptoms. The treatment provided no relief.   Terrance Hodges is a 37 y.o. male who presents to the ED with sinus congestion that started 3 days ago. He complains of nasal drainage, headache and pressure. Has had sinus infections before but this time seems worse.   Past Medical History  Diagnosis Date  . Diabetes mellitus   . Prostatitis   . Reflux     Past Surgical History  Procedure Laterality Date  . Tonsillectomy    . Adenoidectomy    . Knee surgery    . Wisdom tooth extraction      History reviewed. No pertinent family history.  History  Substance Use Topics  . Smoking status: Current Every Day Smoker -- 0.50 packs/day for 15 years    Types: Cigarettes  . Smokeless tobacco: Not on file  . Alcohol Use: No      Review of Systems  Constitutional: Positive for fever (low grade). Negative for chills and diaphoresis.  HENT: Positive for congestion, sore throat, neck pain and sinus pressure. Negative for ear pain and facial swelling.   Eyes: Positive for photophobia and redness. Negative for pain and discharge.  Respiratory: Positive for cough.  Negative for chest tightness, shortness of breath and wheezing.   Cardiovascular: Negative for chest pain and palpitations.  Gastrointestinal: Positive for nausea. Negative for vomiting, abdominal pain, diarrhea and constipation.  Genitourinary: Negative for dysuria and frequency.  Musculoskeletal: Positive for back pain. Negative for myalgias.  Skin: Negative for color change and rash.  Allergic/Immunologic: Positive for food allergies.  Neurological: Positive for headaches. Negative for dizziness and weakness.  Psychiatric/Behavioral: Negative for confusion. The patient is not nervous/anxious.     Allergies  Clindamycin/lincomycin; Penicillins; and Pineapple  Home Medications   Current Outpatient Rx  Name  Route  Sig  Dispense  Refill  . acetaminophen (TYLENOL) 500 MG tablet   Oral   Take 500 mg by mouth every 6 (six) hours as needed for pain.         . cephALEXin (KEFLEX) 500 MG capsule   Oral   Take 1 capsule (500 mg total) by mouth 4 (four) times daily.   28 capsule   0   . cephALEXin (KEFLEX) 500 MG capsule   Oral   Take 1 capsule (500 mg total) by mouth 4 (four) times daily.   28 capsule   0   . cyclobenzaprine (FLEXERIL) 10 MG tablet   Oral   Take 1 tablet (10 mg total) by mouth 2 (two) times daily as needed for muscle spasms.   20 tablet   0   . HYDROcodone-acetaminophen (NORCO/VICODIN) 5-325 MG per tablet   Oral   Take 2 tablets by mouth every 6 (  six) hours as needed for pain.   30 tablet   0   . ibuprofen (ADVIL,MOTRIN) 600 MG tablet   Oral   Take 1 tablet (600 mg total) by mouth every 6 (six) hours as needed for pain.   30 tablet   0   . oxyCODONE-acetaminophen (PERCOCET) 5-325 MG per tablet   Oral   Take 2 tablets by mouth every 4 (four) hours as needed for pain.   12 tablet   0   . traMADol (ULTRAM) 50 MG tablet   Oral   Take 1 tablet (50 mg total) by mouth every 6 (six) hours as needed for pain.   15 tablet   0     BP 125/77  Pulse  99  Temp(Src) 98.1 F (36.7 C) (Oral)  Resp 17  Ht 5\' 10"  (1.778 m)  Wt 210 lb (95.255 kg)  BMI 30.13 kg/m2  SpO2 95%  Physical Exam  Nursing note and vitals reviewed. Constitutional: He is oriented to person, place, and time. He appears well-developed and well-nourished.  HENT:  Head: Normocephalic and atraumatic.  Right Ear: Tympanic membrane and external ear normal.  Left Ear: Tympanic membrane and external ear normal.  Nose: Rhinorrhea present. Right sinus exhibits maxillary sinus tenderness and frontal sinus tenderness. Left sinus exhibits maxillary sinus tenderness and frontal sinus tenderness.  Mouth/Throat: Uvula is midline and mucous membranes are normal. Posterior oropharyngeal erythema present.  Eyes: Conjunctivae and EOM are normal. Pupils are equal, round, and reactive to light.  Neck: Normal range of motion. Neck supple.  Cardiovascular: Normal rate.   Pulmonary/Chest: Effort normal and breath sounds normal. He has no wheezes.  Abdominal: Soft. There is no tenderness.  Musculoskeletal: Normal range of motion.  Neurological: He is alert and oriented to person, place, and time. No cranial nerve deficit.  Skin: Skin is warm and dry.  Psychiatric: He has a normal mood and affect. His behavior is normal. Judgment and thought content normal.   Assessment:  37 y.o. male with sinusitis  Plan:  Antibiotics   Decongestants   Follow up with PCP, return as needed ED Course  Procedures (including critical care time)  MDM  Discussed with the patient and all questioned fully answered. He will return if any problems arise.    Medication List    TAKE these medications       azithromycin 250 MG tablet  Commonly known as:  ZITHROMAX Z-PAK  Take 2 tablets today and then one tablet daily     ibuprofen 600 MG tablet  Commonly known as:  ADVIL,MOTRIN  Take 1 tablet (600 mg total) by mouth every 6 (six) hours as needed for pain.     ibuprofen 600 MG tablet  Commonly known  as:  ADVIL,MOTRIN  Take 1 tablet (600 mg total) by mouth every 6 (six) hours as needed for pain.     pseudoephedrine 60 MG tablet  Commonly known as:  SUDAFED  Take 1 tablet (60 mg total) by mouth every 6 (six) hours as needed for congestion.      ASK your doctor about these medications       acetaminophen 500 MG tablet  Commonly known as:  TYLENOL  Take 500 mg by mouth every 6 (six) hours as needed for pain.     cephALEXin 500 MG capsule  Commonly known as:  KEFLEX  Take 1 capsule (500 mg total) by mouth 4 (four) times daily.     cephALEXin 500 MG capsule  Commonly  known as:  KEFLEX  Take 1 capsule (500 mg total) by mouth 4 (four) times daily.     cyclobenzaprine 10 MG tablet  Commonly known as:  FLEXERIL  Take 1 tablet (10 mg total) by mouth 2 (two) times daily as needed for muscle spasms.     HYDROcodone-acetaminophen 5-325 MG per tablet  Commonly known as:  NORCO/VICODIN  Take 2 tablets by mouth every 6 (six) hours as needed for pain.     ibuprofen 600 MG tablet  Commonly known as:  ADVIL,MOTRIN  Take 1 tablet (600 mg total) by mouth every 6 (six) hours as needed for pain.     oxyCODONE-acetaminophen 5-325 MG per tablet  Commonly known as:  PERCOCET  Take 2 tablets by mouth every 4 (four) hours as needed for pain.     traMADol 50 MG tablet  Commonly known as:  ULTRAM  Take 1 tablet (50 mg total) by mouth every 6 (six) hours as needed for pain.               [place], [place] 10/01/12 [phone]

## 2012-10-01 NOTE — ED Provider Notes (Signed)
Medical screening examination/treatment/procedure(s) were performed by non-physician practitioner and as supervising physician I was immediately available for consultation/collaboration.   Charles B. Sheldon, MD 10/01/12 1716 

## 2012-10-09 ENCOUNTER — Emergency Department (HOSPITAL_BASED_OUTPATIENT_CLINIC_OR_DEPARTMENT_OTHER)
Admission: EM | Admit: 2012-10-09 | Discharge: 2012-10-09 | Disposition: A | Discharge status: Home / Self Care | Payer: Self-pay | Attending: Emergency Medicine | Admitting: Emergency Medicine

## 2012-10-09 ENCOUNTER — Encounter (HOSPITAL_BASED_OUTPATIENT_CLINIC_OR_DEPARTMENT_OTHER): Payer: Self-pay | Admitting: *Deleted

## 2012-10-09 DIAGNOSIS — Z792 Long term (current) use of antibiotics: Secondary | ICD-10-CM | POA: Insufficient documentation

## 2012-10-09 DIAGNOSIS — K0889 Other specified disorders of teeth and supporting structures: Secondary | ICD-10-CM

## 2012-10-09 DIAGNOSIS — K089 Disorder of teeth and supporting structures, unspecified: Secondary | ICD-10-CM | POA: Insufficient documentation

## 2012-10-09 DIAGNOSIS — Z880 Allergy status to penicillin: Secondary | ICD-10-CM | POA: Insufficient documentation

## 2012-10-09 DIAGNOSIS — E119 Type 2 diabetes mellitus without complications: Secondary | ICD-10-CM | POA: Insufficient documentation

## 2012-10-09 DIAGNOSIS — Z79899 Other long term (current) drug therapy: Secondary | ICD-10-CM | POA: Insufficient documentation

## 2012-10-09 DIAGNOSIS — Z8719 Personal history of other diseases of the digestive system: Secondary | ICD-10-CM | POA: Insufficient documentation

## 2012-10-09 DIAGNOSIS — F17200 Nicotine dependence, unspecified, uncomplicated: Secondary | ICD-10-CM | POA: Insufficient documentation

## 2012-10-09 DIAGNOSIS — Z87448 Personal history of other diseases of urinary system: Secondary | ICD-10-CM | POA: Insufficient documentation

## 2012-10-09 DIAGNOSIS — K029 Dental caries, unspecified: Secondary | ICD-10-CM | POA: Insufficient documentation

## 2012-10-09 MED ORDER — OXYCODONE-ACETAMINOPHEN 5-325 MG PO TABS
2.0000 | ORAL_TABLET | Freq: Once | ORAL | Status: AC
  Administered 2012-10-09: 2 via ORAL
  Filled 2012-10-09 (×2): qty 2

## 2012-10-09 MED ORDER — OXYCODONE-ACETAMINOPHEN 5-325 MG PO TABS: 2.0000 | ORAL_TABLET | ORAL | Status: DC | PRN

## 2012-10-09 NOTE — ED Provider Notes (Signed)
History  This chart was scribed for Glynn Octave, MD by Shari Heritage, ED Scribe. The patient was seen in room MH09/MH09. Patient's care was started at 0059.   CSN: [id number]  Arrival date & time 10/09/12  0013   First MD Initiated Contact with Patient 10/09/12 0059      Chief Complaint  Patient presents with  . Dental Pain     The history is provided by the patient. A language interpreter was used.    HPI Comments: Terrance Hodges is a 37 y.o. male who presents to the Emergency Department complaining of moderate to severe, constant, non-radiating left lower dental pain onset 2 days ago. Patient denies fever, chills, nausea or vomiting or any other symptoms at this time. He hasn't tried any medicines for pain relief. He states that he has an appointment scheduled with a dentist tomorrow. He has a medical history of diabetes, prostatitis, and reflux. His surgical history includes tonsillectomy, adenoidectomy and wisdom tooth extraction.   Past Medical History  Diagnosis Date  . Diabetes mellitus   . Prostatitis   . Reflux     Past Surgical History  Procedure Laterality Date  . Tonsillectomy    . Adenoidectomy    . Knee surgery    . Wisdom tooth extraction      No family history on file.  History  Substance Use Topics  . Smoking status: Current Every Day Smoker -- 0.50 packs/day for 15 years    Types: Cigarettes  . Smokeless tobacco: Not on file  . Alcohol Use: No      Review of Systems A complete 10 system review of systems was obtained and all systems are negative except as noted in the HPI and PMH.    Allergies  Clindamycin/lincomycin; Penicillins; and Pineapple  Home Medications   Current Outpatient Rx  Name  Route  Sig  Dispense  Refill  . acetaminophen (TYLENOL) 500 MG tablet   Oral   Take 500 mg by mouth every 6 (six) hours as needed for pain.         [name] azithromycin (ZITHROMAX Z-PAK) 250 MG tablet      Take 2 tablets today and then one tablet  daily   6 tablet   0   . cephALEXin (KEFLEX) 500 MG capsule   Oral   Take 1 capsule (500 mg total) by mouth 4 (four) times daily.   28 capsule   0   . cephALEXin (KEFLEX) 500 MG capsule   Oral   Take 1 capsule (500 mg total) by mouth 4 (four) times daily.   28 capsule   0   . cyclobenzaprine (FLEXERIL) 10 MG tablet   Oral   Take 1 tablet (10 mg total) by mouth 2 (two) times daily as needed for muscle spasms.   20 tablet   0   . HYDROcodone-acetaminophen (NORCO/VICODIN) 5-325 MG per tablet   Oral   Take 2 tablets by mouth every 6 (six) hours as needed for pain.   30 tablet   0   . ibuprofen (ADVIL,MOTRIN) 600 MG tablet   Oral   Take 1 tablet (600 mg total) by mouth every 6 (six) hours as needed for pain.   30 tablet   0   . ibuprofen (ADVIL,MOTRIN) 600 MG tablet   Oral   Take 1 tablet (600 mg total) by mouth every 6 (six) hours as needed for pain.   30 tablet   0   . ibuprofen (ADVIL,MOTRIN) 600  MG tablet   Oral   Take 1 tablet (600 mg total) by mouth every 6 (six) hours as needed for pain.   30 tablet   0   . oxyCODONE-acetaminophen (PERCOCET) 5-325 MG per tablet   Oral   Take 2 tablets by mouth every 4 (four) hours as needed for pain.   12 tablet   0   . pseudoephedrine (SUDAFED) 60 MG tablet   Oral   Take 1 tablet (60 mg total) by mouth every 6 (six) hours as needed for congestion.   30 tablet   0   . traMADol (ULTRAM) 50 MG tablet   Oral   Take 1 tablet (50 mg total) by mouth every 6 (six) hours as needed for pain.   15 tablet   0     Triage Vitals: BP 131/89  Pulse 81  Temp(Src) 99 F (37.2 C) (Oral)  Resp 16  Wt 210 lb (95.255 kg)  BMI 30.13 kg/m2  SpO2 97%  Physical Exam  Constitutional: He is oriented to person, place, and time. He appears well-developed and well-nourished.  HENT:  Head: Normocephalic and atraumatic.  Mouth/Throat: Oropharynx is clear and moist.  Left lower molar tender to palpation. No abscess. Floor of mouth  soft. No trismus. Multiple caries and tartar throughout.  Eyes: Conjunctivae and EOM are normal. Pupils are equal, round, and reactive to light.  Neck: Normal range of motion. Neck supple.  Cardiovascular: Normal rate, regular rhythm and normal heart sounds.   Pulmonary/Chest: Effort normal and breath sounds normal.  Abdominal: Soft. There is no tenderness.  Musculoskeletal: Normal range of motion.  Neurological: He is alert and oriented to person, place, and time.  Skin: Skin is warm and dry.    ED Course  Procedures (including critical care time) DIAGNOSTIC STUDIES: Oxygen Saturation is 97% on room air, adequate by my interpretation.    COORDINATION OF CARE: 1:06 AM- Patient informed of current plan for treatment and evaluation and agrees with plan at this time.       Labs Reviewed - No data to display No results found.   No diagnosis found.    MDM  Left lower molar dental pain. No fever. No difficulty breathing or swallowing.  No evidence of Ludwig angina. Patient has appointment at 7 AM with Dr. Leanord Asal. Will treat pain.      I personally performed the services described in this documentation, which was scribed in my presence. The recorded information has been reviewed and is accurate.    Glynn Octave, MD 10/09/12 [phone]

## 2012-10-09 NOTE — ED Notes (Signed)
Dental pain. 

## 2013-01-20 ENCOUNTER — Encounter (HOSPITAL_BASED_OUTPATIENT_CLINIC_OR_DEPARTMENT_OTHER): Payer: Self-pay

## 2013-01-20 ENCOUNTER — Emergency Department (HOSPITAL_BASED_OUTPATIENT_CLINIC_OR_DEPARTMENT_OTHER)
Admission: EM | Admit: 2013-01-20 | Discharge: 2013-01-20 | Disposition: A | Discharge status: Home / Self Care | Payer: Self-pay | Attending: Emergency Medicine | Admitting: Emergency Medicine

## 2013-01-20 DIAGNOSIS — R6889 Other general symptoms and signs: Secondary | ICD-10-CM | POA: Insufficient documentation

## 2013-01-20 DIAGNOSIS — Z8719 Personal history of other diseases of the digestive system: Secondary | ICD-10-CM | POA: Insufficient documentation

## 2013-01-20 DIAGNOSIS — H6120 Impacted cerumen, unspecified ear: Secondary | ICD-10-CM | POA: Insufficient documentation

## 2013-01-20 DIAGNOSIS — H60399 Other infective otitis externa, unspecified ear: Secondary | ICD-10-CM | POA: Insufficient documentation

## 2013-01-20 DIAGNOSIS — H6122 Impacted cerumen, left ear: Secondary | ICD-10-CM

## 2013-01-20 DIAGNOSIS — H6092 Unspecified otitis externa, left ear: Secondary | ICD-10-CM

## 2013-01-20 DIAGNOSIS — F17200 Nicotine dependence, unspecified, uncomplicated: Secondary | ICD-10-CM | POA: Insufficient documentation

## 2013-01-20 DIAGNOSIS — Z87448 Personal history of other diseases of urinary system: Secondary | ICD-10-CM | POA: Insufficient documentation

## 2013-01-20 DIAGNOSIS — R51 Headache: Secondary | ICD-10-CM | POA: Insufficient documentation

## 2013-01-20 DIAGNOSIS — E119 Type 2 diabetes mellitus without complications: Secondary | ICD-10-CM | POA: Insufficient documentation

## 2013-01-20 DIAGNOSIS — Z880 Allergy status to penicillin: Secondary | ICD-10-CM | POA: Insufficient documentation

## 2013-01-20 MED ORDER — NEOMYCIN-COLIST-HC-THONZONIUM 3.3-3-10-0.5 MG/ML OT SUSP
OTIC | Status: AC
  Filled 2013-01-20: qty 5

## 2013-01-20 MED ORDER — NEOMYCIN-COLIST-HC-THONZONIUM 3.3-3-10-0.5 MG/ML OT SUSP
3.0000 [drp] | Freq: Four times a day (QID) | OTIC | Status: DC
  Administered 2013-01-20: 3 [drp] via OTIC

## 2013-01-20 MED ORDER — HYDROCODONE-ACETAMINOPHEN 5-325 MG PO TABS
1.0000 | ORAL_TABLET | Freq: Once | ORAL | Status: AC
  Administered 2013-01-20: 1 via ORAL
  Filled 2013-01-20: qty 1

## 2013-01-20 MED ORDER — DOCUSATE SODIUM 100 MG PO CAPS: 100.0000 mg | ORAL_CAPSULE | Freq: Once | ORAL | Status: AC

## 2013-01-20 MED ORDER — DOCUSATE SODIUM 50 MG/5ML PO LIQD
ORAL | Status: AC
  Administered 2013-01-20: 10 mg via OTIC
  Filled 2013-01-20: qty 10

## 2013-01-20 NOTE — ED Provider Notes (Signed)
CSN: [id number]     Arrival date & time 01/20/13  1723 History  This chart was scribed for Charles B. Bernette Mayers, MD by Ardelia Mems, ED Scribe. This patient was seen in room MH07/MH07 and the patient's care was started at 7:25 PM.    Chief Complaint  Patient presents with  . Otalgia    The history is provided by the patient. No language interpreter was used.   HPI Comments: Terrance Hodges is a 37 y.o. male with a history of DM who presents to the Emergency Department complaining of gradual onset, gradually worsening, constant, moderate bilateral ear pain, described as pressure onset last night. He states that his pain is worse in his left ear than his right. He also reports an associated constant, mild headache, also described as pressure. He denies associated drainage from his ears. He states that he has been sneezing for the past few days. He reports that he believes his ears are full of wax, and believes this may be causing his pain. He denies fever, congestion, rhinorrhea, eye pain, neck pain, sore throat or any other symptoms. Pt is a current every day smoker of 0.5 packs./day of 15 years.  ENT- Seen at [facility], Nose and Throat   Past Medical History  Diagnosis Date  . Diabetes mellitus   . Prostatitis   . Reflux    Past Surgical History  Procedure Laterality Date  . Tonsillectomy    . Adenoidectomy    . Knee surgery    . Wisdom tooth extraction     No family history on file. History  Substance Use Topics  . Smoking status: Current Every Day Smoker -- 0.50 packs/day for 15 years    Types: Cigarettes  . Smokeless tobacco: Not on file  . Alcohol Use: No    Review of Systems A complete 10 system review of systems was obtained and all systems are negative except as noted in the HPI and PMH.   Allergies  Clindamycin/lincomycin; Penicillins; and Pineapple  Home Medications  No current outpatient prescriptions on file.  Triage Vitals: BP 121/67  Pulse 97   Temp(Src) 99.1 F (37.3 C) (Oral)  Resp 20  Ht 5\' 10"  (1.778 m)  Wt 210 lb (95.255 kg)  BMI 30.13 kg/m2  SpO2 100%  Physical Exam  Nursing note and vitals reviewed. Constitutional: He is oriented to person, place, and time. He appears well-developed and well-nourished.  HENT:  Head: Normocephalic and atraumatic.  Right canal with a small amount of wax, TM normal. Left canal moderately swollen with cerumen impaction, unable to visualize the TM.  Eyes: EOM are normal. Pupils are equal, round, and reactive to light.  Neck: Normal range of motion. Neck supple.  Cardiovascular: Normal rate, normal heart sounds and intact distal pulses.   Pulmonary/Chest: Effort normal and breath sounds normal.  Abdominal: Bowel sounds are normal. He exhibits no distension. There is no tenderness.  Musculoskeletal: Normal range of motion. He exhibits no edema and no tenderness.  Neurological: He is alert and oriented to person, place, and time. He has normal strength. No cranial nerve deficit or sensory deficit.  Skin: Skin is warm and dry. No rash noted.  Psychiatric: He has a normal mood and affect. His behavior is normal.    ED Course   Medications  docusate (COLACE) 50 MG/5ML liquid (10 mg Left Ear Given 01/20/13 1940)  docusate sodium (COLACE) capsule 100 mg (0 mg Oral Duplicate 01/20/13 1946)  HYDROcodone-acetaminophen (NORCO/VICODIN) 5-325 MG  per tablet 1 tablet (1 tablet Oral Given 01/20/13 2009)   Procedures (including critical care time)  DIAGNOSTIC STUDIES: Oxygen Saturation is 100% on RA, normal by my interpretation.    COORDINATION OF CARE: 7:31 PM- Pt advised of plan for treatment and pt agrees.  9:23 PM- Recheck with pt and he states that he is feeling slightly better after irrigation by nurse. Some cerumen remains, but TM partially visualized does not show signs of infection. He states that he feels ready for discharge and agrees to follow up with his ENT.   Labs Reviewed - No data  to display  No results found.  1. Otitis externa of left ear   2. Cerumen impaction, left     MDM        I personally performed the services described in this documentation, which was scribed in my presence. The recorded information has been reviewed and is accurate.     Charles B. Bernette Mayers, MD 01/20/13 2127

## 2013-01-20 NOTE — ED Notes (Signed)
Patient complains of bilateral ear pain and pressure x 2 days. Reports general headache with same. Thinks it may be full of wax, no other associated symptoms.

## 2013-03-02 ENCOUNTER — Encounter (HOSPITAL_BASED_OUTPATIENT_CLINIC_OR_DEPARTMENT_OTHER): Payer: Self-pay | Admitting: Emergency Medicine

## 2013-03-02 ENCOUNTER — Emergency Department (HOSPITAL_BASED_OUTPATIENT_CLINIC_OR_DEPARTMENT_OTHER)
Admission: EM | Admit: 2013-03-02 | Discharge: 2013-03-02 | Disposition: A | Discharge status: Home / Self Care | Payer: Self-pay | Attending: Emergency Medicine | Admitting: Emergency Medicine

## 2013-03-02 ENCOUNTER — Emergency Department (HOSPITAL_BASED_OUTPATIENT_CLINIC_OR_DEPARTMENT_OTHER): Payer: Self-pay

## 2013-03-02 DIAGNOSIS — Z87448 Personal history of other diseases of urinary system: Secondary | ICD-10-CM | POA: Insufficient documentation

## 2013-03-02 DIAGNOSIS — F17200 Nicotine dependence, unspecified, uncomplicated: Secondary | ICD-10-CM | POA: Insufficient documentation

## 2013-03-02 DIAGNOSIS — S39012A Strain of muscle, fascia and tendon of lower back, initial encounter: Secondary | ICD-10-CM

## 2013-03-02 DIAGNOSIS — Z8719 Personal history of other diseases of the digestive system: Secondary | ICD-10-CM | POA: Insufficient documentation

## 2013-03-02 DIAGNOSIS — Y939 Activity, unspecified: Secondary | ICD-10-CM | POA: Insufficient documentation

## 2013-03-02 DIAGNOSIS — Y9289 Other specified places as the place of occurrence of the external cause: Secondary | ICD-10-CM | POA: Insufficient documentation

## 2013-03-02 DIAGNOSIS — E119 Type 2 diabetes mellitus without complications: Secondary | ICD-10-CM | POA: Insufficient documentation

## 2013-03-02 DIAGNOSIS — S335XXA Sprain of ligaments of lumbar spine, initial encounter: Secondary | ICD-10-CM | POA: Insufficient documentation

## 2013-03-02 DIAGNOSIS — W172XXA Fall into hole, initial encounter: Secondary | ICD-10-CM | POA: Insufficient documentation

## 2013-03-02 MED ORDER — KETOROLAC TROMETHAMINE 60 MG/2ML IM SOLN
60.0000 mg | Freq: Once | INTRAMUSCULAR | Status: AC
  Administered 2013-03-02: 60 mg via INTRAMUSCULAR
  Filled 2013-03-02: qty 2

## 2013-03-02 MED ORDER — HYDROCODONE-ACETAMINOPHEN 5-325 MG PO TABS: 2.0000 | ORAL_TABLET | ORAL | Status: DC | PRN

## 2013-03-02 NOTE — ED Notes (Signed)
Pt c/o lower back pain radiating down right leg. Pt states he stepped backwards into a ditch yesterday.

## 2013-03-02 NOTE — ED Provider Notes (Signed)
CSN: [id number]     Arrival date & time 03/02/13  0865 History   First MD Initiated Contact with Patient 03/02/13 [phone]     Chief Complaint  Patient presents with  . Back Pain   (Consider location/radiation/quality/duration/timing/severity/associated sxs/prior Treatment) HPI Comments: Patient is a 37 year old male otherwise healthy presents with complaints of right lower back pain that started yesterday after a reported fall. Patient states that he stepped backward into a ditch yesterday and landed on his buttocks. Since that time he has had discomfort in his right lower back that radiates into the back of his left thigh. He denies any bowel or bladder complaints. He denies any numbness or weakness.  Patient is a 37 y.o. male presenting with back pain. The history is provided by the patient.  Back Pain Location:  Lumbar spine Quality:  Stabbing Radiates to:  R posterior upper leg Pain severity:  Moderate Onset quality:  Sudden Duration:  12 hours Timing:  Constant Progression:  Worsening Chronicity:  New Context: falling   Relieved by:  Nothing Worsened by:  Ambulation, bending and palpation Ineffective treatments:  None tried Associated symptoms: no bladder incontinence, no bowel incontinence and no fever     Past Medical History  Diagnosis Date  . Diabetes mellitus   . Prostatitis   . Reflux    Past Surgical History  Procedure Laterality Date  . Tonsillectomy    . Adenoidectomy    . Knee surgery    . Wisdom tooth extraction     No family history on file. History  Substance Use Topics  . Smoking status: Current Every Day Smoker -- 0.50 packs/day for 15 years    Types: Cigarettes  . Smokeless tobacco: Not on file  . Alcohol Use: No    Review of Systems  Constitutional: Negative for fever.  Gastrointestinal: Negative for bowel incontinence.  Genitourinary: Negative for bladder incontinence.  Musculoskeletal: Positive for back pain.  All other systems reviewed and  are negative.    Allergies  Clindamycin/lincomycin; Penicillins; and Pineapple  Home Medications  No current outpatient prescriptions on file. BP 142/90  Pulse 91  Temp(Src) 97.7 F (36.5 C) (Oral)  Resp 18  SpO2 100% Physical Exam  Nursing note and vitals reviewed. Constitutional: He is oriented to person, place, and time. He appears well-developed and well-nourished. No distress.  HENT:  Head: Normocephalic and atraumatic.  Neck: Normal range of motion. Neck supple.  Musculoskeletal:  There is tenderness to palpation in the soft tissues of the right lower lumbar region. There is no bruising or obvious abnormality.  Neurological: He is alert and oriented to person, place, and time.  Strength is 5 out of 5 in the bilateral lower extremities. Patellar and Achilles DTRs are 1+ and equal bilaterally. He is able to ambulate on his heels and toes.  Skin: Skin is warm and dry. He is not diaphoretic.    ED Course  Procedures (including critical care time) Labs Review Labs Reviewed - No data to display Imaging Review No results found.  MDM  No diagnosis found. Patient presents here with back pain after a fall. Physical examination is unremarkable with equal strength and reflexes. He is ambulatory without difficulty and he denies any bowel or bladder issues. There is no evidence for cauda equina or an emergent cause. Will discharge with anti-inflammatories, rest, and follow up with primary Dr. if not improving in the next week.    Geoffery Lyons, MD 03/02/13 [phone]

## 2013-07-29 ENCOUNTER — Emergency Department (HOSPITAL_BASED_OUTPATIENT_CLINIC_OR_DEPARTMENT_OTHER)
Admission: EM | Admit: 2013-07-29 | Discharge: 2013-07-29 | Disposition: A | Discharge status: Home / Self Care | Payer: Self-pay | Attending: Emergency Medicine | Admitting: Emergency Medicine

## 2013-07-29 ENCOUNTER — Emergency Department (HOSPITAL_BASED_OUTPATIENT_CLINIC_OR_DEPARTMENT_OTHER): Payer: Self-pay

## 2013-07-29 ENCOUNTER — Encounter (HOSPITAL_BASED_OUTPATIENT_CLINIC_OR_DEPARTMENT_OTHER): Payer: Self-pay | Admitting: Emergency Medicine

## 2013-07-29 DIAGNOSIS — Z880 Allergy status to penicillin: Secondary | ICD-10-CM | POA: Insufficient documentation

## 2013-07-29 DIAGNOSIS — R0989 Other specified symptoms and signs involving the circulatory and respiratory systems: Secondary | ICD-10-CM | POA: Insufficient documentation

## 2013-07-29 DIAGNOSIS — Y929 Unspecified place or not applicable: Secondary | ICD-10-CM | POA: Insufficient documentation

## 2013-07-29 DIAGNOSIS — S0083XA Contusion of other part of head, initial encounter: Principal | ICD-10-CM

## 2013-07-29 DIAGNOSIS — S0003XA Contusion of scalp, initial encounter: Secondary | ICD-10-CM | POA: Insufficient documentation

## 2013-07-29 DIAGNOSIS — F17200 Nicotine dependence, unspecified, uncomplicated: Secondary | ICD-10-CM | POA: Insufficient documentation

## 2013-07-29 DIAGNOSIS — Y939 Activity, unspecified: Secondary | ICD-10-CM | POA: Insufficient documentation

## 2013-07-29 DIAGNOSIS — Z87448 Personal history of other diseases of urinary system: Secondary | ICD-10-CM | POA: Insufficient documentation

## 2013-07-29 DIAGNOSIS — Z8719 Personal history of other diseases of the digestive system: Secondary | ICD-10-CM | POA: Insufficient documentation

## 2013-07-29 DIAGNOSIS — S1093XA Contusion of unspecified part of neck, initial encounter: Secondary | ICD-10-CM

## 2013-07-29 DIAGNOSIS — W1809XA Striking against other object with subsequent fall, initial encounter: Secondary | ICD-10-CM | POA: Insufficient documentation

## 2013-07-29 DIAGNOSIS — R0609 Other forms of dyspnea: Secondary | ICD-10-CM | POA: Insufficient documentation

## 2013-07-29 DIAGNOSIS — E119 Type 2 diabetes mellitus without complications: Secondary | ICD-10-CM | POA: Insufficient documentation

## 2013-07-29 MED ORDER — IBUPROFEN 800 MG PO TABS: 800.0000 mg | ORAL_TABLET | Freq: Three times a day (TID) | ORAL | Status: DC

## 2013-07-29 MED ORDER — HYDROCODONE-ACETAMINOPHEN 5-325 MG PO TABS: 2.0000 | ORAL_TABLET | ORAL | Status: DC | PRN

## 2013-07-29 NOTE — ED Provider Notes (Signed)
CSN: [id number]     Arrival date & time 07/29/13  1712 History   First MD Initiated Contact with Patient 07/29/13 1908     Chief Complaint  Patient presents with  . Neck Injury     (Consider location/radiation/quality/duration/timing/severity/associated sxs/prior Treatment) Patient is a 38 y.o. male presenting with neck injury. The history is provided by the patient. No language interpreter was used.  Neck Injury This is a new problem. The current episode started today. The problem occurs constantly. The problem has been unchanged. Associated symptoms include myalgias. Nothing aggravates the symptoms. He has tried nothing for the symptoms. The treatment provided no relief.  Pt was hit in the back of his neck by a chunk of ice.  Pt complains of pain to the back of his   Past Medical History  Diagnosis Date  . Diabetes mellitus   . Prostatitis   . Reflux    Past Surgical History  Procedure Laterality Date  . Tonsillectomy    . Adenoidectomy    . Knee surgery    . Wisdom tooth extraction     No family history on file. History  Substance Use Topics  . Smoking status: Current Every Day Smoker -- 0.50 packs/day for 15 years    Types: Cigarettes  . Smokeless tobacco: Not on file  . Alcohol Use: No    Review of Systems  Musculoskeletal: Positive for myalgias.  All other systems reviewed and are negative.      Allergies  Clindamycin/lincomycin; Penicillins; and Pineapple  Home Medications   Current Outpatient Rx  Name  Route  Sig  Dispense  Refill  . HYDROcodone-acetaminophen (NORCO) 5-325 MG per tablet   Oral   Take 2 tablets by mouth every 4 (four) hours as needed for pain.   10 tablet   0    BP 134/78  Pulse 118  Temp(Src) 98.6 F (37 C) (Oral)  Resp 16  Ht 5\' 10"  (1.778 m)  Wt 215 lb (97.523 kg)  BMI 30.85 kg/m2  SpO2 100% Physical Exam  Nursing note and vitals reviewed. Constitutional: He is oriented to person, place, and time. He appears  well-developed and well-nourished.  HENT:  Head: Normocephalic.  Eyes: Pupils are equal, round, and reactive to light.  Neck: Normal range of motion.  Tender posterior neck,  Decreased range of motion,   nv and ns intact  Cardiovascular: Normal rate.   Pulmonary/Chest: He is in respiratory distress.  Abdominal: Soft. Bowel sounds are normal.  Musculoskeletal: Normal range of motion.  Neurological: He is alert and oriented to person, place, and time. He has normal reflexes.  Skin: Skin is warm.  Psychiatric: He has a normal mood and affect.    ED Course  Procedures (including critical care time) Labs Review Labs Reviewed - No data to display Imaging Review Dg Cervical Spine 2-3 Views  07/29/2013   CLINICAL DATA:  38 year old male with neck pain following fall.  EXAM: CERVICAL SPINE - 2-3 VIEW  COMPARISON:  None.  FINDINGS: Normal alignment is noted.  There is no evidence of fracture, subluxation or prevertebral soft tissue swelling.  Mild degenerative disc disease facet arthropathy in the lower cervical spine identified.  No suspicious focal bony lesions are noted.  IMPRESSION: No static evidence of acute injury to the cervical spine.  Mild degenerative changes in the lower cervical spine   Electronically Signed   By: Laveda Abbe M.D.   On: 07/29/2013 18:38    EKG Interpretation   None  MDM   Final diagnoses:  Contusion of neck    No fx,   Pt given rx for hydrocodone and ibuprofen    Elson AreasLeslie K Natassia Guthridge, PA-C 07/29/13 [phone]

## 2013-07-29 NOTE — ED Provider Notes (Signed)
Medical screening examination/treatment/procedure(s) were performed by non-physician practitioner and as supervising physician I was immediately available for consultation/collaboration.  Gerhard Munchobert Novalee Horsfall, MD 07/29/13 [phone]

## 2013-07-29 NOTE — ED Notes (Signed)
Pt c/o neck pain after getting hit by ice falling off the roof of house. Pt denies loc.

## 2013-07-29 NOTE — ED Notes (Signed)
Called for pt in waiting area for his xray, was told that he is outside

## 2013-07-29 NOTE — Discharge Instructions (Signed)

## 2013-08-22 ENCOUNTER — Encounter (HOSPITAL_BASED_OUTPATIENT_CLINIC_OR_DEPARTMENT_OTHER): Payer: Self-pay | Admitting: Emergency Medicine

## 2013-08-22 ENCOUNTER — Emergency Department (HOSPITAL_BASED_OUTPATIENT_CLINIC_OR_DEPARTMENT_OTHER): Payer: Self-pay

## 2013-08-22 ENCOUNTER — Emergency Department (HOSPITAL_BASED_OUTPATIENT_CLINIC_OR_DEPARTMENT_OTHER)
Admission: EM | Admit: 2013-08-22 | Discharge: 2013-08-22 | Disposition: A | Discharge status: Home / Self Care | Payer: Self-pay | Attending: Emergency Medicine | Admitting: Emergency Medicine

## 2013-08-22 DIAGNOSIS — E119 Type 2 diabetes mellitus without complications: Secondary | ICD-10-CM | POA: Insufficient documentation

## 2013-08-22 DIAGNOSIS — R109 Unspecified abdominal pain: Secondary | ICD-10-CM

## 2013-08-22 DIAGNOSIS — Z8719 Personal history of other diseases of the digestive system: Secondary | ICD-10-CM | POA: Insufficient documentation

## 2013-08-22 DIAGNOSIS — R824 Acetonuria: Secondary | ICD-10-CM | POA: Insufficient documentation

## 2013-08-22 DIAGNOSIS — F17200 Nicotine dependence, unspecified, uncomplicated: Secondary | ICD-10-CM | POA: Insufficient documentation

## 2013-08-22 DIAGNOSIS — Z87448 Personal history of other diseases of urinary system: Secondary | ICD-10-CM | POA: Insufficient documentation

## 2013-08-22 DIAGNOSIS — R1033 Periumbilical pain: Secondary | ICD-10-CM | POA: Insufficient documentation

## 2013-08-22 LAB — LIPASE, BLOOD: LIPASE: 76 U/L — AB (ref 11–59)

## 2013-08-22 LAB — URINALYSIS, ROUTINE W REFLEX MICROSCOPIC
Bilirubin Urine: NEGATIVE
GLUCOSE, UA: NEGATIVE mg/dL
KETONES UR: NEGATIVE mg/dL
LEUKOCYTES UA: NEGATIVE
Nitrite: NEGATIVE
PROTEIN: NEGATIVE mg/dL
Specific Gravity, Urine: 1.019 (ref 1.005–1.030)
UROBILINOGEN UA: 1 mg/dL (ref 0.0–1.0)
pH: 7 (ref 5.0–8.0)

## 2013-08-22 LAB — COMPREHENSIVE METABOLIC PANEL
ALT: 34 U/L (ref 0–53)
AST: 20 U/L (ref 0–37)
Albumin: 3.9 g/dL (ref 3.5–5.2)
Alkaline Phosphatase: 77 U/L (ref 39–117)
BUN: 15 mg/dL (ref 6–23)
CALCIUM: 9.4 mg/dL (ref 8.4–10.5)
CO2: 26 meq/L (ref 19–32)
CREATININE: 0.9 mg/dL (ref 0.50–1.35)
Chloride: 101 mEq/L (ref 96–112)
GLUCOSE: 112 mg/dL — AB (ref 70–99)
Potassium: 3.8 mEq/L (ref 3.7–5.3)
SODIUM: 141 meq/L (ref 137–147)
TOTAL PROTEIN: 7.1 g/dL (ref 6.0–8.3)
Total Bilirubin: 0.2 mg/dL — ABNORMAL LOW (ref 0.3–1.2)

## 2013-08-22 LAB — CBC WITH DIFFERENTIAL/PLATELET
Basophils Absolute: 0 10*3/uL (ref 0.0–0.1)
Basophils Relative: 0 % (ref 0–1)
Eosinophils Absolute: 0.2 10*3/uL (ref 0.0–0.7)
Eosinophils Relative: 1 % (ref 0–5)
HCT: 43.4 % (ref 39.0–52.0)
Hemoglobin: 15.3 g/dL (ref 13.0–17.0)
Lymphocytes Relative: 26 % (ref 12–46)
Lymphs Abs: 3 10*3/uL (ref 0.7–4.0)
MCH: 33 pg (ref 26.0–34.0)
MCHC: 35.3 g/dL (ref 30.0–36.0)
MCV: 93.7 fL (ref 78.0–100.0)
Monocytes Absolute: 0.8 10*3/uL (ref 0.1–1.0)
Monocytes Relative: 7 % (ref 3–12)
Neutro Abs: 7.8 10*3/uL — ABNORMAL HIGH (ref 1.7–7.7)
Neutrophils Relative %: 66 % (ref 43–77)
Platelets: 263 10*3/uL (ref 150–400)
RBC: 4.63 MIL/uL (ref 4.22–5.81)
RDW: 12.5 % (ref 11.5–15.5)
WBC: 11.9 10*3/uL — ABNORMAL HIGH (ref 4.0–10.5)

## 2013-08-22 LAB — URINE MICROSCOPIC-ADD ON

## 2013-08-22 MED ORDER — HYDROMORPHONE HCL PF 1 MG/ML IJ SOLN
0.5000 mg | Freq: Once | INTRAMUSCULAR | Status: AC
  Administered 2013-08-22: 0.5 mg via INTRAVENOUS
  Filled 2013-08-22: qty 1

## 2013-08-22 MED ORDER — ONDANSETRON 4 MG PO TBDP: 4.0000 mg | ORAL_TABLET | Freq: Three times a day (TID) | ORAL | Status: DC | PRN

## 2013-08-22 MED ORDER — ONDANSETRON HCL 4 MG/2ML IJ SOLN
4.0000 mg | Freq: Once | INTRAMUSCULAR | Status: AC
  Administered 2013-08-22: 4 mg via INTRAVENOUS
  Filled 2013-08-22: qty 2

## 2013-08-22 MED ORDER — SODIUM CHLORIDE 0.9 % IV SOLN: 1000.0000 mL | Freq: Once | INTRAVENOUS | Status: DC

## 2013-08-22 MED ORDER — HYDROCODONE-ACETAMINOPHEN 5-325 MG PO TABS: 1.0000 | ORAL_TABLET | Freq: Four times a day (QID) | ORAL | Status: DC | PRN

## 2013-08-22 MED ORDER — SODIUM CHLORIDE 0.9 % IV BOLUS (SEPSIS)
1000.0000 mL | Freq: Once | INTRAVENOUS | Status: AC
  Administered 2013-08-22: 1000 mL via INTRAVENOUS

## 2013-08-22 MED ORDER — OXYCODONE-ACETAMINOPHEN 5-325 MG PO TABS
1.0000 | ORAL_TABLET | Freq: Once | ORAL | Status: AC
  Administered 2013-08-22: 1 via ORAL
  Filled 2013-08-22: qty 1

## 2013-08-22 MED ORDER — SODIUM CHLORIDE 0.9 % IV SOLN
1000.0000 mL | Freq: Once | INTRAVENOUS | Status: AC
  Administered 2013-08-22: 1000 mL via INTRAVENOUS

## 2013-08-22 NOTE — ED Notes (Signed)
MD at bedside. 

## 2013-08-22 NOTE — ED Notes (Signed)
Pt ambulating independently w/ steady gait on d/c in no acute distress, A&Ox4. D/c instructions reviewed w/ pt and family - pt and family deny any further questions or concerns at present. Rx given x2  

## 2013-08-22 NOTE — ED Notes (Signed)
Abdominal pain that started this afternoon.  Nausea and diarrhea.

## 2013-08-22 NOTE — ED Notes (Signed)
Patient transported to CT 

## 2013-08-22 NOTE — ED Provider Notes (Signed)
CSN: [id number]     Arrival date & time 08/22/13  1756 History  This chart was scribed for Gerhard Munch, MD by Ardelia Mems, ED Scribe. This patient was seen in room MH10/MH10 and the patient's care was started at 6:50 PM.  Chief Complaint  Patient presents with  . Abdominal Pain    The history is provided by the patient. No language interpreter was used.    HPI Comments: Terrance Hodges is a 38 y.o. Male with a history of DM and reflux who presents to the Emergency Department complaining of periumbilical abdominal pain that radiates to his right abdomen onset about 7 hours ago. He describes this pain as a "shooting" sensation. He reports that he has been feeling lightheaded in association with this pain. He also reports associated nausea and 1 episode of diarrhea today. He denies any vomiting. He denies any prior history of similar abdominal pain. He states that he has had a kidney stone in the past, but that his current pain is much less severe than the pain he had with his kidney stone. He denies any prior history of abdominal surgeries. He states that his urine has been darker than usual lately, but he denies any other urinary symptoms. He also denies chest pain, dyspnea or any other symptoms. He states that he has medication allergies only to Penicillins and Clindamycin.   Past Medical History  Diagnosis Date  . Diabetes mellitus   . Prostatitis   . Reflux    Past Surgical History  Procedure Laterality Date  . Tonsillectomy    . Adenoidectomy    . Knee surgery    . Wisdom tooth extraction     No family history on file. History  Substance Use Topics  . Smoking status: Current Every Day Smoker -- 0.50 packs/day for 15 years    Types: Cigarettes  . Smokeless tobacco: Not on file  . Alcohol Use: No    Review of Systems  Constitutional:       Per HPI, otherwise negative  HENT:       Per HPI, otherwise negative  Respiratory:       Per HPI, otherwise negative   Cardiovascular:       Per HPI, otherwise negative  Gastrointestinal: Negative for vomiting.  Endocrine:       Negative aside from HPI  Genitourinary:       Neg aside from HPI   Musculoskeletal:       Per HPI, otherwise negative  Skin: Negative.   Neurological: Negative for syncope.   Allergies  Clindamycin/lincomycin; Penicillins; and Pineapple  Home Medications   Current Outpatient Rx  Name  Route  Sig  Dispense  Refill  . HYDROcodone-acetaminophen (NORCO) 5-325 MG per tablet   Oral   Take 2 tablets by mouth every 4 (four) hours as needed for pain.   10 tablet   0   . HYDROcodone-acetaminophen (NORCO/VICODIN) 5-325 MG per tablet   Oral   Take 2 tablets by mouth every 4 (four) hours as needed.   20 tablet   0   . ibuprofen (ADVIL,MOTRIN) 800 MG tablet   Oral   Take 1 tablet (800 mg total) by mouth 3 (three) times daily.   21 tablet   0    Triage Vitals: BP 150/83  Pulse 96  Temp(Src) 99 F (37.2 C) (Oral)  Resp 16  Ht 5\' 10"  (1.778 m)  Wt 215 lb (97.523 kg)  BMI 30.85 kg/m2  SpO2 100%  Physical Exam  Nursing note and vitals reviewed. Constitutional: He is oriented to person, place, and time. He appears well-developed. No distress.  HENT:  Head: Normocephalic and atraumatic.  Eyes: Conjunctivae and EOM are normal.  Cardiovascular: Normal rate, regular rhythm and normal heart sounds.   No murmur heard. Pulmonary/Chest: Effort normal and breath sounds normal. No stridor. No respiratory distress. He has no wheezes. He has no rales.  Abdominal: He exhibits no distension. There is tenderness. There is guarding.  Tenderness with guarding on right abdomen. No peritoneal signs.  Musculoskeletal: He exhibits no edema.  Neurological: He is alert and oriented to person, place, and time.  Skin: Skin is warm and dry.  Psychiatric: He has a normal mood and affect.    ED Course  Procedures (including critical care time)  DIAGNOSTIC STUDIES: Oxygen Saturation is  100% on RA, normal by my interpretation.    COORDINATION OF CARE: 6:55 PM- Discussed plan to obtain diagnostic lab work and radiology. Will also order IV fluids, Morphine and Zofran. Pt advised of plan for treatment and pt agrees.  8:48 PM- Recheck and discussed lab work findings, especially that pt's lipase was elevated. He now adds that he was a heavy/daily drinker in the past. Will give pt additional fluids in the ED and will discharge with medications. Discussed plan for pt to follow-up with a GI specialist.  Medications  0.9 %  sodium chloride infusion (0 mLs Intravenous Stopped 08/22/13 2023)  ondansetron (ZOFRAN) injection 4 mg (4 mg Intravenous Given 08/22/13 1931)  HYDROmorphone (DILAUDID) injection 0.5 mg (0.5 mg Intravenous Given 08/22/13 1931)  HYDROmorphone (DILAUDID) injection 0.5 mg (0.5 mg Intravenous Given 08/22/13 2031)  sodium chloride 0.9 % bolus 1,000 mL (1,000 mLs Intravenous New Bag/Given 08/22/13 2035)   Labs Review Labs Reviewed  URINALYSIS, ROUTINE W REFLEX MICROSCOPIC - Abnormal; Notable for the following:    Hgb urine dipstick TRACE (*)    All other components within normal limits  CBC WITH DIFFERENTIAL - Abnormal; Notable for the following:    WBC 11.9 (*)    Neutro Abs 7.8 (*)    All other components within normal limits  COMPREHENSIVE METABOLIC PANEL - Abnormal; Notable for the following:    Glucose, Bld 112 (*)    Total Bilirubin 0.2 (*)    All other components within normal limits  LIPASE, BLOOD - Abnormal; Notable for the following:    Lipase 76 (*)    All other components within normal limits  URINE MICROSCOPIC-ADD ON   Imaging Review Ct Abdomen Pelvis Wo Contrast  08/22/2013   CLINICAL DATA:  Right-sided pain.  EXAM: CT ABDOMEN AND PELVIS WITHOUT CONTRAST  TECHNIQUE: Multidetector CT imaging of the abdomen and pelvis was performed following the standard protocol without intravenous contrast.  COMPARISON:  06/01/2013  FINDINGS: Lung bases are clear. No  evidence for free air. Unenhanced CT was performed per clinician order. Lack of IV contrast limits sensitivity and specificity, especially for evaluation of abdominal/pelvic solid viscera.  Normal appearance of the liver, gallbladder, spleen, pancreas, stomach and adrenal glands. Normal appearance of both kidneys. Negative for stones or hydronephrosis. Normal appearance of the urinary bladder, prostate and seminal vesicles. No significant free fluid or lymphadenopathy.  Normal appearance of the appendix. Normal appearance of the small and large bowel.  No acute bone abnormality.  IMPRESSION: Negative CT of the abdomen and pelvis. Negative for kidney stones or hydronephrosis.   Electronically Signed   By: Richarda Overlie M.D.   On:  08/22/2013 19:36    MDM  Patient presents with concerns of abdominal pain.  On exam he is awake alert, in no distress, afebrile, hemodynamically stable.  Patient's labs are largely reassuring, with mild elevation in lipase, mild ketonuria.  CT is essentially unremarkable.  Patient had improvement here.  Patient was discharged in stable condition with antiemetics, analgesics, gastroenterology followup.   Gerhard Munchobert Shakerria Parran, MD 08/22/13 2103

## 2013-08-22 NOTE — Discharge Instructions (Signed)
As discussed, your evaluation today has been largely reassuring.  But, it is important that you monitor your condition carefully, and do not hesitate to return to the ED if you develop new, or concerning changes in your condition. ? ?Otherwise, please follow-up with your physician for appropriate ongoing care. ? ?

## 2013-08-25 ENCOUNTER — Emergency Department (HOSPITAL_BASED_OUTPATIENT_CLINIC_OR_DEPARTMENT_OTHER)
Admission: EM | Admit: 2013-08-25 | Discharge: 2013-08-25 | Disposition: A | Discharge status: Home / Self Care | Payer: Self-pay | Attending: Emergency Medicine | Admitting: Emergency Medicine

## 2013-08-25 ENCOUNTER — Encounter (HOSPITAL_BASED_OUTPATIENT_CLINIC_OR_DEPARTMENT_OTHER): Payer: Self-pay | Admitting: Emergency Medicine

## 2013-08-25 ENCOUNTER — Emergency Department (HOSPITAL_BASED_OUTPATIENT_CLINIC_OR_DEPARTMENT_OTHER): Payer: Self-pay

## 2013-08-25 DIAGNOSIS — R109 Unspecified abdominal pain: Secondary | ICD-10-CM

## 2013-08-25 DIAGNOSIS — R1011 Right upper quadrant pain: Secondary | ICD-10-CM | POA: Insufficient documentation

## 2013-08-25 DIAGNOSIS — Z880 Allergy status to penicillin: Secondary | ICD-10-CM | POA: Insufficient documentation

## 2013-08-25 DIAGNOSIS — E119 Type 2 diabetes mellitus without complications: Secondary | ICD-10-CM | POA: Insufficient documentation

## 2013-08-25 DIAGNOSIS — K219 Gastro-esophageal reflux disease without esophagitis: Secondary | ICD-10-CM | POA: Insufficient documentation

## 2013-08-25 DIAGNOSIS — Z79899 Other long term (current) drug therapy: Secondary | ICD-10-CM | POA: Insufficient documentation

## 2013-08-25 DIAGNOSIS — F17200 Nicotine dependence, unspecified, uncomplicated: Secondary | ICD-10-CM | POA: Insufficient documentation

## 2013-08-25 DIAGNOSIS — Z791 Long term (current) use of non-steroidal anti-inflammatories (NSAID): Secondary | ICD-10-CM | POA: Insufficient documentation

## 2013-08-25 DIAGNOSIS — N289 Disorder of kidney and ureter, unspecified: Secondary | ICD-10-CM | POA: Insufficient documentation

## 2013-08-25 LAB — CBC WITH DIFFERENTIAL/PLATELET
BAND NEUTROPHILS: 1 % (ref 0–10)
BASOS PCT: 0 % (ref 0–1)
Basophils Absolute: 0 10*3/uL (ref 0.0–0.1)
Blasts: 0 %
Eosinophils Absolute: 0.4 10*3/uL (ref 0.0–0.7)
Eosinophils Relative: 3 % (ref 0–5)
HEMATOCRIT: 47.1 % (ref 39.0–52.0)
HEMOGLOBIN: 17.1 g/dL — AB (ref 13.0–17.0)
Lymphocytes Relative: 10 % — ABNORMAL LOW (ref 12–46)
Lymphs Abs: 1.3 10*3/uL (ref 0.7–4.0)
MCH: 33.5 pg (ref 26.0–34.0)
MCHC: 36.3 g/dL — ABNORMAL HIGH (ref 30.0–36.0)
MCV: 92.4 fL (ref 78.0–100.0)
MONO ABS: 0.6 10*3/uL (ref 0.1–1.0)
MONOS PCT: 5 % (ref 3–12)
Metamyelocytes Relative: 1 %
Myelocytes: 1 %
Neutro Abs: 10.2 10*3/uL — ABNORMAL HIGH (ref 1.7–7.7)
Neutrophils Relative %: 78 % — ABNORMAL HIGH (ref 43–77)
Platelets: 271 10*3/uL (ref 150–400)
Promyelocytes Absolute: 1 %
RBC: 5.1 MIL/uL (ref 4.22–5.81)
RDW: 13 % (ref 11.5–15.5)
WBC: 12.5 10*3/uL — AB (ref 4.0–10.5)
nRBC: 0 /100 WBC

## 2013-08-25 LAB — COMPREHENSIVE METABOLIC PANEL
ALBUMIN: 4.2 g/dL (ref 3.5–5.2)
ALT: 45 U/L (ref 0–53)
AST: 26 U/L (ref 0–37)
Alkaline Phosphatase: 78 U/L (ref 39–117)
BILIRUBIN TOTAL: 0.2 mg/dL — AB (ref 0.3–1.2)
BUN: 12 mg/dL (ref 6–23)
CALCIUM: 10 mg/dL (ref 8.4–10.5)
CHLORIDE: 102 meq/L (ref 96–112)
CO2: 26 mEq/L (ref 19–32)
CREATININE: 0.8 mg/dL (ref 0.50–1.35)
GFR calc Af Amer: >90 mL/min (ref >90)
GFR calc non Af Amer: >90 mL/min (ref >90)
Glucose, Bld: 142 mg/dL — ABNORMAL HIGH (ref 70–99)
Potassium: 4.2 mEq/L (ref 3.7–5.3)
Sodium: 143 mEq/L (ref 137–147)
Total Protein: 7.5 g/dL (ref 6.0–8.3)

## 2013-08-25 LAB — LIPASE, BLOOD: LIPASE: 49 U/L (ref 11–59)

## 2013-08-25 MED ORDER — HYDROCODONE-ACETAMINOPHEN 5-325 MG PO TABS: 1.0000 | ORAL_TABLET | Freq: Four times a day (QID) | ORAL | Status: DC | PRN

## 2013-08-25 MED ORDER — SODIUM CHLORIDE 0.9 % IV SOLN
Freq: Once | INTRAVENOUS | Status: AC
  Administered 2013-08-25: 13:00:00 via INTRAVENOUS

## 2013-08-25 MED ORDER — HYDROMORPHONE HCL PF 1 MG/ML IJ SOLN
1.0000 mg | Freq: Once | INTRAMUSCULAR | Status: AC
  Administered 2013-08-25: 1 mg via INTRAVENOUS
  Filled 2013-08-25: qty 1

## 2013-08-25 MED ORDER — ONDANSETRON HCL 4 MG/2ML IJ SOLN
4.0000 mg | Freq: Once | INTRAMUSCULAR | Status: AC
  Administered 2013-08-25: 4 mg via INTRAMUSCULAR
  Filled 2013-08-25: qty 2

## 2013-08-25 MED ORDER — PANTOPRAZOLE SODIUM 20 MG PO TBEC: 20.0000 mg | DELAYED_RELEASE_TABLET | Freq: Every day | ORAL | Status: DC

## 2013-08-25 NOTE — ED Provider Notes (Signed)
CSN: [id number]     Arrival date & time 08/25/13  1222 History   First MD Initiated Contact with Patient 08/25/13 1229     Chief Complaint  Patient presents with  . Abdominal Pain     (Consider location/radiation/quality/duration/timing/severity/associated sxs/prior Treatment) Patient is a 38 y.o. male presenting with abdominal pain. The history is provided by the patient. No language interpreter was used.  Abdominal Pain Pain location:  RUQ Pain quality: aching and stabbing   Pain radiates to:  Does not radiate Pain severity:  Severe Onset quality:  Gradual Duration:  1 week Timing:  Constant Progression:  Worsening Chronicity:  New Context: not alcohol use   Relieved by:  Nothing Worsened by:  Nothing tried Ineffective treatments:  None tried Associated symptoms: nausea   Associated symptoms: no chest pain and no fever   Risk factors: alcohol abuse   Risk factors: no aspirin use, has not had multiple surgeries and no NSAID use     Past Medical History  Diagnosis Date  . Diabetes mellitus   . Prostatitis   . Reflux    Past Surgical History  Procedure Laterality Date  . Tonsillectomy    . Adenoidectomy    . Knee surgery    . Wisdom tooth extraction     No family history on file. History  Substance Use Topics  . Smoking status: Current Every Day Smoker -- 0.50 packs/day for 15 years    Types: Cigarettes  . Smokeless tobacco: Not on file  . Alcohol Use: No    Review of Systems  Constitutional: Negative for fever.  Cardiovascular: Negative for chest pain.  Gastrointestinal: Positive for nausea and abdominal pain.  All other systems reviewed and are negative.      Allergies  Clindamycin/lincomycin; Penicillins; and Pineapple  Home Medications   Current Outpatient Rx  Name  Route  Sig  Dispense  Refill  . HYDROcodone-acetaminophen (NORCO) 5-325 MG per tablet   Oral   Take 1 tablet by mouth every 6 (six) hours as needed.   15 tablet   0   .  HYDROcodone-acetaminophen (NORCO/VICODIN) 5-325 MG per tablet   Oral   Take 2 tablets by mouth every 4 (four) hours as needed.   20 tablet   0   . ibuprofen (ADVIL,MOTRIN) 800 MG tablet   Oral   Take 1 tablet (800 mg total) by mouth 3 (three) times daily.   21 tablet   0   . ondansetron (ZOFRAN ODT) 4 MG disintegrating tablet   Oral   Take 1 tablet (4 mg total) by mouth every 8 (eight) hours as needed for nausea or vomiting.   20 tablet   0   . pantoprazole (PROTONIX) 20 MG tablet   Oral   Take 1 tablet (20 mg total) by mouth daily.   30 tablet   1    BP 163/94  Pulse 93  Temp(Src) 98.4 F (36.9 C) (Oral)  Resp 18  Ht 5\' 10"  (1.778 m)  Wt 215 lb (97.523 kg)  BMI 30.85 kg/m2  SpO2 100% Physical Exam  Nursing note and vitals reviewed. Constitutional: He is oriented to person, place, and time. He appears well-developed and well-nourished.  HENT:  Head: Normocephalic.  Right Ear: External ear normal.  Left Ear: External ear normal.  Eyes: EOM are normal. Pupils are equal, round, and reactive to light.  Neck: Normal range of motion.  Cardiovascular: Normal rate and regular rhythm.   Pulmonary/Chest: Effort normal and breath sounds  normal.  Abdominal: Soft. He exhibits no distension. There is tenderness.  Musculoskeletal: Normal range of motion.  Neurological: He is alert and oriented to person, place, and time.  Skin: Skin is warm.  Psychiatric: He has a normal mood and affect.    ED Course  Procedures (including critical care time) Labs Review Labs Reviewed  CBC WITH DIFFERENTIAL - Abnormal; Notable for the following:    WBC 12.5 (*)    Hemoglobin 17.1 (*)    MCHC 36.3 (*)    Neutrophils Relative % 78 (*)    Lymphocytes Relative 10 (*)    Neutro Abs 10.2 (*)    All other components within normal limits  COMPREHENSIVE METABOLIC PANEL - Abnormal; Notable for the following:    Glucose, Bld 142 (*)    Total Bilirubin 0.2 (*)    All other components within  normal limits  LIPASE, BLOOD   Imaging Review [place] Abdomen Complete  08/25/2013   CLINICAL DATA:  Right upper quadrant pain  EXAM: ULTRASOUND ABDOMEN COMPLETE  COMPARISON:  None.  FINDINGS: Gallbladder:  No gallstones or wall thickening visualized. No sonographic Murphy sign noted.  Common bile duct:  Diameter: 2.8 mm in caliber  Liver:  Increased echogenicity throughout the liver without focal mass  IVC:  No abnormality visualized.  Pancreas:  Partially obscured.  No obvious mass per  Spleen:  Size and appearance within normal limits.  Right Kidney:  Length: 12.8 cm in length. Within the upper pole, there is a central irregular complex lesion measuring 2.4 x 2.2 x 1.1 cm. There are several septations.  Left Kidney:  Length: 12.8 cm in length. Echogenicity within normal limits. No mass or hydronephrosis visualized.  Abdominal aorta:  No aneurysm visualized.  Other findings:  None.  IMPRESSION: Diffuse hepatic steatosis.  Partially obscured pancreas.  Complex lesion in the upper pole of the right kidney. MRI is recommended.   Electronically Signed   By: Maryclare BeanArt  Hoss M.D.   On: 08/25/2013 13:33     EKG Interpretation None      MDM ultrasound shows renal mass.   Pt counseled on results.   I do not think this is the cause of pt's pain.  I suspect gastritis/     Final diagnoses:  Abdominal pain    Pt scheduled for MRI to evaluate kidney lesion.   Pt given rx for protonix and hydrocodone.   Pt given Gi docotr referral    Elson AreasLeslie K Lujean Ebright, PA-C 08/25/13 1701

## 2013-08-25 NOTE — ED Notes (Signed)
Patient given instructions regarding follow up mri by radiology, patient verbalized understanding

## 2013-08-25 NOTE — ED Notes (Signed)
Patient states that came in Thursday for R side/central abd pain. Getting no relief, clay like bowel movement, some nausea, no appetite, generalize body pain

## 2013-08-25 NOTE — Discharge Instructions (Signed)
Abdominal Pain, Adult °Many things can cause abdominal pain. Usually, abdominal pain is not caused by a disease and will improve without treatment. It can often be observed and treated at home. Your health care provider will do a physical exam and possibly order blood tests and X-rays to help determine the seriousness of your pain. However, in many cases, more time must pass before a clear cause of the pain can be found. Before that point, your health care provider may not know if you need more testing or further treatment. °HOME CARE INSTRUCTIONS  °Monitor your abdominal pain for any changes. The following actions may help to alleviate any discomfort you are experiencing: °· Only take over-the-counter or prescription medicines as directed by your health care provider. °· Do not take laxatives unless directed to do so by your health care provider. °· Try a clear liquid diet (broth, tea, or water) as directed by your health care provider. Slowly move to a bland diet as tolerated. °SEEK MEDICAL CARE IF: °· You have unexplained abdominal pain. °· You have abdominal pain associated with nausea or diarrhea. °· You have pain when you urinate or have a bowel movement. °· You experience abdominal pain that wakes you in the night. °· You have abdominal pain that is worsened or improved by eating food. °· You have abdominal pain that is worsened with eating fatty foods. °SEEK IMMEDIATE MEDICAL CARE IF:  °· Your pain does not go away within 2 hours. °· You have a fever. °· You keep throwing up (vomiting). °· Your pain is felt only in portions of the abdomen, such as the right side or the left lower portion of the abdomen. °· You pass bloody or black tarry stools. °MAKE SURE YOU: °· Understand these instructions.   °· Will watch your condition.   °· Will get help right away if you are not doing well or get worse.   °Document Released: 03/02/2005 Document Revised: 03/13/2013 Document Reviewed: 01/30/2013 °ExitCare® Patient  Information ©2014 [employer]. ° °

## 2013-08-26 ENCOUNTER — Other Ambulatory Visit (HOSPITAL_BASED_OUTPATIENT_CLINIC_OR_DEPARTMENT_OTHER): Payer: Self-pay | Admitting: Physician Assistant

## 2013-08-26 DIAGNOSIS — R109 Unspecified abdominal pain: Secondary | ICD-10-CM

## 2013-08-28 NOTE — ED Notes (Signed)
Pt called and sts he called GI Dr and they require payment to see him. Pt advised to contact office manager and set up payment plan possibly. Pt agreed.

## 2013-08-28 NOTE — ED Provider Notes (Signed)
Medical screening examination/treatment/procedure(s) were performed by non-physician practitioner and as supervising physician I was immediately available for consultation/collaboration.   EKG Interpretation None        Darryl Willner M Rosell Khouri, MD 08/28/13 0205 

## 2013-08-31 ENCOUNTER — Ambulatory Visit (HOSPITAL_BASED_OUTPATIENT_CLINIC_OR_DEPARTMENT_OTHER)
Admission: RE | Admit: 2013-08-31 | Discharge: 2013-08-31 | Disposition: A | Discharge status: Home / Self Care | Payer: Self-pay | Source: Ambulatory Visit | Attending: Physician Assistant | Admitting: Physician Assistant

## 2013-08-31 DIAGNOSIS — R109 Unspecified abdominal pain: Secondary | ICD-10-CM | POA: Insufficient documentation

## 2013-08-31 MED ORDER — GADOBENATE DIMEGLUMINE 529 MG/ML IV SOLN: 20.0000 mL | Freq: Once | INTRAVENOUS | Status: AC | PRN

## 2013-09-03 NOTE — ED Notes (Signed)
Pt [facility] results [facility] and advised to f/u with Urology on call which is Tannenbaum.

## 2013-09-03 NOTE — ED Notes (Signed)
Pt called to get MR report. [place], [place] will be given pt information and notified to call pt with results today at noon.

## 2013-09-04 NOTE — ED Notes (Signed)
Pt presented to Radiology dept stating that he never got his MR results. This nurse called pt yesterday and gave him phone number and told him he should f/u with Dr. Patsi Searsannenbaum, pt sts he does recall talking to me and has an appt with urology next Friday. Pt wife with pt. Both verbalized understanding of plan of care.

## 2013-10-14 ENCOUNTER — Emergency Department (HOSPITAL_BASED_OUTPATIENT_CLINIC_OR_DEPARTMENT_OTHER): Payer: Self-pay

## 2013-10-14 ENCOUNTER — Emergency Department (HOSPITAL_BASED_OUTPATIENT_CLINIC_OR_DEPARTMENT_OTHER)
Admission: EM | Admit: 2013-10-14 | Discharge: 2013-10-14 | Disposition: A | Discharge status: Home / Self Care | Payer: Self-pay | Attending: Emergency Medicine | Admitting: Emergency Medicine

## 2013-10-14 ENCOUNTER — Encounter (HOSPITAL_BASED_OUTPATIENT_CLINIC_OR_DEPARTMENT_OTHER): Payer: Self-pay | Admitting: Emergency Medicine

## 2013-10-14 DIAGNOSIS — R000 Tachycardia, unspecified: Secondary | ICD-10-CM | POA: Insufficient documentation

## 2013-10-14 DIAGNOSIS — F17200 Nicotine dependence, unspecified, uncomplicated: Secondary | ICD-10-CM | POA: Insufficient documentation

## 2013-10-14 DIAGNOSIS — R109 Unspecified abdominal pain: Secondary | ICD-10-CM

## 2013-10-14 DIAGNOSIS — Z79899 Other long term (current) drug therapy: Secondary | ICD-10-CM | POA: Insufficient documentation

## 2013-10-14 DIAGNOSIS — Z87448 Personal history of other diseases of urinary system: Secondary | ICD-10-CM | POA: Insufficient documentation

## 2013-10-14 DIAGNOSIS — R1013 Epigastric pain: Secondary | ICD-10-CM | POA: Insufficient documentation

## 2013-10-14 DIAGNOSIS — E119 Type 2 diabetes mellitus without complications: Secondary | ICD-10-CM | POA: Insufficient documentation

## 2013-10-14 DIAGNOSIS — Z791 Long term (current) use of non-steroidal anti-inflammatories (NSAID): Secondary | ICD-10-CM | POA: Insufficient documentation

## 2013-10-14 DIAGNOSIS — Z880 Allergy status to penicillin: Secondary | ICD-10-CM | POA: Insufficient documentation

## 2013-10-14 DIAGNOSIS — K219 Gastro-esophageal reflux disease without esophagitis: Secondary | ICD-10-CM | POA: Insufficient documentation

## 2013-10-14 DIAGNOSIS — R197 Diarrhea, unspecified: Secondary | ICD-10-CM | POA: Insufficient documentation

## 2013-10-14 DIAGNOSIS — R630 Anorexia: Secondary | ICD-10-CM | POA: Insufficient documentation

## 2013-10-14 DIAGNOSIS — K279 Peptic ulcer, site unspecified, unspecified as acute or chronic, without hemorrhage or perforation: Secondary | ICD-10-CM | POA: Insufficient documentation

## 2013-10-14 LAB — CBC WITH DIFFERENTIAL/PLATELET
Basophils Absolute: 0 10*3/uL (ref 0.0–0.1)
Basophils Relative: 0 % (ref 0–1)
Eosinophils Absolute: 0 10*3/uL (ref 0.0–0.7)
Eosinophils Relative: 0 % (ref 0–5)
HEMATOCRIT: 44.8 % (ref 39.0–52.0)
Hemoglobin: 15.9 g/dL (ref 13.0–17.0)
LYMPHS ABS: 1.6 10*3/uL (ref 0.7–4.0)
LYMPHS PCT: 15 % (ref 12–46)
MCH: 33.7 pg (ref 26.0–34.0)
MCHC: 35.5 g/dL (ref 30.0–36.0)
MCV: 94.9 fL (ref 78.0–100.0)
MONO ABS: 0.6 10*3/uL (ref 0.1–1.0)
Monocytes Relative: 5 % (ref 3–12)
NEUTROS ABS: 8.6 10*3/uL — AB (ref 1.7–7.7)
Neutrophils Relative %: 80 % — ABNORMAL HIGH (ref 43–77)
Platelets: 259 10*3/uL (ref 150–400)
RBC: 4.72 MIL/uL (ref 4.22–5.81)
RDW: 12.9 % (ref 11.5–15.5)
WBC: 10.9 10*3/uL — AB (ref 4.0–10.5)

## 2013-10-14 LAB — COMPREHENSIVE METABOLIC PANEL
ALT: 18 U/L (ref 0–53)
AST: 17 U/L (ref 0–37)
Albumin: 4.3 g/dL (ref 3.5–5.2)
Alkaline Phosphatase: 86 U/L (ref 39–117)
BILIRUBIN TOTAL: 0.4 mg/dL (ref 0.3–1.2)
BUN: 8 mg/dL (ref 6–23)
CHLORIDE: 102 meq/L (ref 96–112)
CO2: 23 meq/L (ref 19–32)
Calcium: 9.8 mg/dL (ref 8.4–10.5)
Creatinine, Ser: 1 mg/dL (ref 0.50–1.35)
GLUCOSE: 177 mg/dL — AB (ref 70–99)
Potassium: 3.8 mEq/L (ref 3.7–5.3)
Sodium: 142 mEq/L (ref 137–147)
Total Protein: 7.6 g/dL (ref 6.0–8.3)

## 2013-10-14 LAB — LIPASE, BLOOD: Lipase: 68 U/L — ABNORMAL HIGH (ref 11–59)

## 2013-10-14 MED ORDER — SODIUM CHLORIDE 0.9 % IV BOLUS (SEPSIS)
1000.0000 mL | Freq: Once | INTRAVENOUS | Status: AC
  Administered 2013-10-14: 1000 mL via INTRAVENOUS

## 2013-10-14 MED ORDER — ONDANSETRON HCL 4 MG PO TABS: 4.0000 mg | ORAL_TABLET | Freq: Four times a day (QID) | ORAL | Status: DC

## 2013-10-14 MED ORDER — RANITIDINE HCL 150 MG PO CAPS: 150.0000 mg | ORAL_CAPSULE | Freq: Two times a day (BID) | ORAL | Status: DC

## 2013-10-14 MED ORDER — SUCRALFATE 1 GM/10ML PO SUSP: 1.0000 g | Freq: Three times a day (TID) | ORAL | Status: DC

## 2013-10-14 MED ORDER — GI COCKTAIL ~~LOC~~
30.0000 mL | Freq: Once | ORAL | Status: AC
  Administered 2013-10-14: 30 mL via ORAL
  Filled 2013-10-14: qty 30

## 2013-10-14 MED ORDER — HYDROMORPHONE HCL PF 1 MG/ML IJ SOLN
1.0000 mg | Freq: Once | INTRAMUSCULAR | Status: AC
  Administered 2013-10-14: 1 mg via INTRAVENOUS
  Filled 2013-10-14: qty 1

## 2013-10-14 MED ORDER — HYDROCODONE-ACETAMINOPHEN 5-325 MG PO TABS: 1.0000 | ORAL_TABLET | ORAL | Status: DC | PRN

## 2013-10-14 MED ORDER — ONDANSETRON HCL 4 MG/2ML IJ SOLN
4.0000 mg | Freq: Once | INTRAMUSCULAR | Status: AC
  Administered 2013-10-14: 4 mg via INTRAVENOUS
  Filled 2013-10-14: qty 2

## 2013-10-14 NOTE — Discharge Instructions (Signed)
Diet for Peptic Ulcer Disease Peptic ulcer disease is a term used to describe open sores in the stomach and digestive tract. These sores can be painful, and the pain can be worse when the stomach is empty. These ulcers can lead to bleeding in the esophagus, stomach, and intestines (gastrointestinaltract). Nutrition therapy can help ease the discomfort of peptic ulcer disease.   GENERAL DIET GUIDELINES FOR PEPTIC ULCER DISEASE  Your diet should be rich in fruits, vegetables, and whole grains. It should also be low in fat.   Avoid foods that can irritate your sores.   Avoid foods that are not tolerated or foods that cause pain. This may be different for different people. FOODS AND DRINKS TO AVOID  High-fat dairy and milk products including whole milk, cream, chocolate milk, butter, sour cream, or cream cheese.   High-fat meats and poultry including hot dogs, fried meats or poultry, meats with skin, sausage, or bacon.   Pepper.  Alcohol.  Chocolate.  Tea, coffee, and cola (regular and caffeine).  Lard or hydrogenated oils such as stick margarine.  SAMPLE MEAL PLAN This meal plan is approximately 2000 calories based on https://www.[place].org/ChooseMyPlate.gov meal planning guidelines.  Breakfast   cup cooked oatmeal.  1 cup strawberries.  1 cup low-fat milk.  1 oz almonds. Snack  1 cup cucumber slices.  6 oz yogurt (made from low-fat or fat-free milk). Lunch  2 slices whole-wheat bread.  2 oz sliced [place]ur[place].  2 tsp mayonnaise.  1 cup blueberries.  1 cup snap peas. Snack  6 whole-wheat crackers.  1 oz string cheese. Dinner   cup brown rice.  1 cup mixed veggies.  1 tsp olive oil.  3 oz grilled fish. Document Released: 08/15/2011 Document Reviewed: 08/15/2011 [facility]ExitCare Patient Information 2014 [place]Care, [place].  Abdominal Pain, Adult Many things can cause abdominal pain. Usually, abdominal pain is not caused by a disease and will improve without treatment. It can often  be observed and treated at home. Your health care provider will do a physical exam and possibly order blood tests and X-rays to help determine the seriousness of your pain. However, in many cases, more time must pass before a clear cause of the pain can be found. Before that point, your health care provider may not know if you need more testing or further treatment. HOME CARE INSTRUCTIONS  Monitor your abdominal pain for any changes. The following actions may help to alleviate any discomfort you are experiencing:  Only take over-the-counter or prescription medicines as directed by your health care provider.  Do not take laxatives unless directed to do so by your health care provider.  Try a clear liquid diet (broth, tea, or water) as directed by your health care provider. Slowly move to a bland diet as tolerated. SEEK MEDICAL CARE IF:  You have unexplained abdominal pain.  You have abdominal pain associated with nausea or diarrhea.  You have pain when you urinate or have a bowel movement.  You experience abdominal pain that wakes you in the night.  You have abdominal pain that is worsened or improved by eating food.  You have abdominal pain that is worsened with eating fatty foods. SEEK IMMEDIATE MEDICAL CARE IF:   Your pain does not go away within 2 hours.  You have a fever.  You keep throwing up (vomiting).  Your pain is felt only in portions of the abdomen, such as the right side or the left lower portion of the abdomen.  You pass bloody or  black tarry stools. MAKE SURE YOU:  Understand these instructions.   Will watch your condition.   Will get help right away if you are not doing well or get worse.  Document Released: 03/02/2005 Document Revised: 03/13/2013 Document Reviewed: 01/30/2013 [facility] Patient Information 2014 [place]Care, [place].

## 2013-10-14 NOTE — ED Notes (Signed)
Here for RUQ abdominal Pain radiates to right shoulder. +Nausea, Long history of abdominal pain in the past. Had MRI, CT, US done recently with all results being unremarkable. Pt refuses any antiemetics at the moment.

## 2013-10-14 NOTE — ED Provider Notes (Signed)
CSN: [id number]     Arrival date & time 10/14/13  1058 History   First MD Initiated Contact with Patient 10/14/13 1133     Chief Complaint  Patient presents with  . Abdominal Pain     (Consider location/radiation/quality/duration/timing/severity/associated sxs/prior Treatment) Patient is a 38 y.o. male presenting with abdominal pain. The history is provided by the patient.  Abdominal Pain Pain location:  Epigastric Pain quality: aching, gnawing, sharp and shooting   Pain radiates to:  R shoulder and RUQ Pain severity:  Severe Onset quality:  Gradual Duration:  4 days Timing:  Constant Progression:  Worsening Chronicity:  Recurrent Context: eating   Relieved by:  Nothing Worsened by:  Eating Ineffective treatments:  NSAIDs Associated symptoms: anorexia, diarrhea, nausea and vomiting   Associated symptoms: no chest pain, no cough, no dysuria, no fever, no hematemesis and no shortness of breath   Risk factors: NSAID use   Risk factors: no alcohol abuse   Risk factors comment:  Recent similar sx where he had neg u/s and MRI of the abd.  prior hx 4 years ago of EGD with "raw spots" in his stomach   Past Medical History  Diagnosis Date  . Diabetes mellitus   . Prostatitis   . Reflux    Past Surgical History  Procedure Laterality Date  . Tonsillectomy    . Adenoidectomy    . Knee surgery    . Wisdom tooth extraction     No family history on file. History  Substance Use Topics  . Smoking status: Current Every Day Smoker -- 0.50 packs/day for 15 years    Types: Cigarettes  . Smokeless tobacco: Not on file  . Alcohol Use: No    Review of Systems  Constitutional: Negative for fever.  Respiratory: Negative for cough and shortness of breath.   Cardiovascular: Negative for chest pain.  Gastrointestinal: Positive for nausea, vomiting, abdominal pain, diarrhea and anorexia. Negative for hematemesis.  Genitourinary: Negative for dysuria.  All other systems reviewed and are  negative.     Allergies  Clindamycin/lincomycin; Penicillins; and Pineapple  Home Medications   Prior to Admission medications   Medication Sig Start Date End Date Taking? Authorizing Provider  HYDROcodone-acetaminophen (NORCO) 5-325 MG per tablet Take 1 tablet by mouth every 6 (six) hours as needed. 08/25/13   Elson AreasLeslie K Sofia, PA-C  HYDROcodone-acetaminophen (NORCO/VICODIN) 5-325 MG per tablet Take 2 tablets by mouth every 4 (four) hours as needed. 07/29/13   Elson AreasLeslie K Sofia, PA-C  ibuprofen (ADVIL,MOTRIN) 800 MG tablet Take 1 tablet (800 mg total) by mouth 3 (three) times daily. 07/29/13   Elson AreasLeslie K Sofia, PA-C  ondansetron (ZOFRAN ODT) 4 MG disintegrating tablet Take 1 tablet (4 mg total) by mouth every 8 (eight) hours as needed for nausea or vomiting. 08/22/13   Gerhard Munchobert Lockwood, MD  pantoprazole (PROTONIX) 20 MG tablet Take 1 tablet (20 mg total) by mouth daily. 08/25/13   Elson AreasLeslie K Sofia, PA-C   BP 121/64  Pulse 102  Temp(Src) 98.7 F (37.1 C) (Oral)  Resp 16  Ht 5\' 10"  (1.778 m)  Wt 212 lb (96.163 kg)  BMI 30.42 kg/m2  SpO2 100% Physical Exam  Nursing note and vitals reviewed. Constitutional: He is oriented to person, place, and time. He appears well-developed and well-nourished. No distress.  HENT:  Head: Normocephalic and atraumatic.  Mouth/Throat: Oropharynx is clear and moist.  Eyes: Conjunctivae and EOM are normal. Pupils are equal, round, and reactive to light.  Neck: Normal range  of motion. Neck supple.  Cardiovascular: Regular rhythm and intact distal pulses.  Tachycardia present.   No murmur heard. Pulmonary/Chest: Effort normal and breath sounds normal. No respiratory distress. He has no wheezes. He has no rales.  Abdominal: Soft. Bowel sounds are normal. He exhibits no distension. There is tenderness in the epigastric area. There is no rebound, no guarding, no CVA tenderness and negative Murphy's sign.  Musculoskeletal: Normal range of motion. He exhibits no edema and  no tenderness.  Neurological: He is alert and oriented to person, place, and time.  Skin: Skin is warm and dry. No rash noted. No erythema.  Psychiatric: He has a normal mood and affect. His behavior is normal.    ED Course  Procedures (including critical care time) Labs Review Labs Reviewed  CBC WITH DIFFERENTIAL - Abnormal; Notable for the following:    WBC 10.9 (*)    Neutrophils Relative % 80 (*)    Neutro Abs 8.6 (*)    All other components within normal limits  COMPREHENSIVE METABOLIC PANEL - Abnormal; Notable for the following:    Glucose, Bld 177 (*)    All other components within normal limits  LIPASE, BLOOD - Abnormal; Notable for the following:    Lipase 68 (*)    All other components within normal limits  H. PYLORI ANTIBODY, IGG    Imaging Review Dg Abd Acute W/chest  10/14/2013   CLINICAL DATA:  Abdominal pain and vomiting  EXAM: ACUTE ABDOMEN SERIES (ABDOMEN 2 VIEW & CHEST 1 VIEW)  COMPARISON:  Prior MRI from 08/31/2013 can't radiograph from 07/01/2012  FINDINGS: The cardiac and mediastinal silhouettes are stable in size and contour, and remain within normal limits.  The lungs are normally inflated. No airspace consolidation, pleural effusion, or pulmonary edema is identified. There is no pneumothorax.  Visualized bowel gas pattern is within normal limits without evidence of obstruction or ileus. No free intraperitoneal air. No abnormal bowel wall thickening. No soft tissue mass or abnormal calcification.  No acute osseous abnormality.  IMPRESSION: 1. Nonobstructive bowel gas pattern with no radiographic evidence of acute intra-abdominal process. 2. No acute cardiopulmonary abnormality.   Electronically Signed   By: Rise Mu M.D.   On: 10/14/2013 12:33     EKG Interpretation None      MDM   Final diagnoses:  Abdominal pain  PUD (peptic ulcer disease)    Patient presenting with 4 days of epigastric abdominal pain that radiates to the right shoulder.  He states he had a similar episode approximately a month ago when he received any and abdominal ultrasound and MRI which were all within normal limits. Symptoms are made worse by EEG he's had intermittent vomiting and diarrhea since the symptoms started. He denies any fever. He denies any urinary symptoms and denies alcohol use. He does however take ibuprofen on a regular basis and BC powders approximately 2 times a week. He does have a prior history of an EGD 4 years ago that showed early ulcers.  Patient is slightly tachycardic here and on exam has epigastric abdominal pain without rebound or guarding. Negative Murphy's sign. No lower abdominal pain concerning for appendicitis or diverticulitis.  CBC, CMP, lipase are within normal limits. Acute abdominal series negative for obstruction patient has active bowel sounds on exam. Patient given a GI cocktail, IV fluids and Zofran. On reevaluation he states the nausea is much improved after the Zofran that and only mild improvement with the GI cocktail. Given patient's prior history of  ulcers and NSAID use currently feel most likely this is related to peptic ulcer disease. He has a benign abdomen without concern for perforation or acute surgical pathology. Discussed findings with patient and his wife. Feel that patient needs GI followup with repeat EGD. H. pylori and also sent. Patient discharged home with pain, nausea and H2 blocker.  He was given strict return precautions.    Gwyneth SproutWhitney Mame Twombly, MD 10/14/13 1422

## 2013-10-15 LAB — H. PYLORI ANTIBODY, IGG: H Pylori IgG: <0.4 {ISR}

## 2013-11-16 ENCOUNTER — Emergency Department (HOSPITAL_BASED_OUTPATIENT_CLINIC_OR_DEPARTMENT_OTHER)
Admission: EM | Admit: 2013-11-16 | Discharge: 2013-11-16 | Disposition: A | Discharge status: Home / Self Care | Payer: Self-pay | Attending: Emergency Medicine | Admitting: Emergency Medicine

## 2013-11-16 ENCOUNTER — Emergency Department (HOSPITAL_BASED_OUTPATIENT_CLINIC_OR_DEPARTMENT_OTHER): Payer: Self-pay

## 2013-11-16 ENCOUNTER — Encounter (HOSPITAL_BASED_OUTPATIENT_CLINIC_OR_DEPARTMENT_OTHER): Payer: Self-pay | Admitting: Emergency Medicine

## 2013-11-16 DIAGNOSIS — Z79899 Other long term (current) drug therapy: Secondary | ICD-10-CM | POA: Insufficient documentation

## 2013-11-16 DIAGNOSIS — E119 Type 2 diabetes mellitus without complications: Secondary | ICD-10-CM | POA: Insufficient documentation

## 2013-11-16 DIAGNOSIS — Y9389 Activity, other specified: Secondary | ICD-10-CM | POA: Insufficient documentation

## 2013-11-16 DIAGNOSIS — Z880 Allergy status to penicillin: Secondary | ICD-10-CM | POA: Insufficient documentation

## 2013-11-16 DIAGNOSIS — M545 Low back pain, unspecified: Secondary | ICD-10-CM

## 2013-11-16 DIAGNOSIS — W010XXA Fall on same level from slipping, tripping and stumbling without subsequent striking against object, initial encounter: Secondary | ICD-10-CM | POA: Insufficient documentation

## 2013-11-16 DIAGNOSIS — Z791 Long term (current) use of non-steroidal anti-inflammatories (NSAID): Secondary | ICD-10-CM | POA: Insufficient documentation

## 2013-11-16 DIAGNOSIS — Y9289 Other specified places as the place of occurrence of the external cause: Secondary | ICD-10-CM | POA: Insufficient documentation

## 2013-11-16 DIAGNOSIS — IMO0002 Reserved for concepts with insufficient information to code with codable children: Secondary | ICD-10-CM | POA: Insufficient documentation

## 2013-11-16 DIAGNOSIS — Z87448 Personal history of other diseases of urinary system: Secondary | ICD-10-CM | POA: Insufficient documentation

## 2013-11-16 DIAGNOSIS — K219 Gastro-esophageal reflux disease without esophagitis: Secondary | ICD-10-CM | POA: Insufficient documentation

## 2013-11-16 DIAGNOSIS — W19XXXA Unspecified fall, initial encounter: Secondary | ICD-10-CM

## 2013-11-16 DIAGNOSIS — F17200 Nicotine dependence, unspecified, uncomplicated: Secondary | ICD-10-CM | POA: Insufficient documentation

## 2013-11-16 MED ORDER — OXYCODONE-ACETAMINOPHEN 5-325 MG PO TABS
1.0000 | ORAL_TABLET | Freq: Once | ORAL | Status: AC
  Administered 2013-11-16: 1 via ORAL
  Filled 2013-11-16: qty 1

## 2013-11-16 MED ORDER — DIAZEPAM 5 MG PO TABS
5.0000 mg | ORAL_TABLET | Freq: Once | ORAL | Status: AC
  Administered 2013-11-16: 5 mg via ORAL
  Filled 2013-11-16: qty 1

## 2013-11-16 MED ORDER — CYCLOBENZAPRINE HCL 10 MG PO TABS: 10.0000 mg | ORAL_TABLET | Freq: Three times a day (TID) | ORAL | Status: DC | PRN

## 2013-11-16 MED ORDER — KETOROLAC TROMETHAMINE 60 MG/2ML IM SOLN
60.0000 mg | Freq: Once | INTRAMUSCULAR | Status: AC
  Administered 2013-11-16: 60 mg via INTRAMUSCULAR
  Filled 2013-11-16: qty 2

## 2013-11-16 MED ORDER — NAPROXEN 500 MG PO TABS: 500.0000 mg | ORAL_TABLET | Freq: Two times a day (BID) | ORAL | Status: DC

## 2013-11-16 NOTE — ED Notes (Signed)
Tripped over his dog a few days ago coming down the stairs and landed on his back. Now having mid-back pain with numbness in a few of his fingers.

## 2013-11-16 NOTE — Discharge Instructions (Signed)
Back Pain, Adult Low back pain is very common. About 1 in 5 people have back pain.The cause of low back pain is rarely dangerous. The pain often gets better over time.About half of people with a sudden onset of back pain feel better in just 2 weeks. About 8 in 10 people feel better by 6 weeks.  CAUSES Some common causes of back pain include:  Strain of the muscles or ligaments supporting the spine.  Wear and tear (degeneration) of the spinal discs.  Arthritis.  Direct injury to the back. DIAGNOSIS Most of the time, the direct cause of low back pain is not known.However, back pain can be treated effectively even when the exact cause of the pain is unknown.Answering your caregiver's questions about your overall health and symptoms is one of the most accurate ways to make sure the cause of your pain is not dangerous. If your caregiver needs more information, he or she may order lab work or imaging tests (X-rays or MRIs).However, even if imaging tests show changes in your back, this usually does not require surgery. HOME CARE INSTRUCTIONS For many people, back pain returns.Since low back pain is rarely dangerous, it is often a condition that people can learn to manageon their own.   Remain active. It is stressful on the back to sit or stand in one place. Do not sit, drive, or stand in one place for more than 30 minutes at a time. Take short walks on level surfaces as soon as pain allows.Try to increase the length of time you walk each day.  Do not stay in bed.Resting more than 1 or 2 days can delay your recovery.  Do not avoid exercise or work.Your body is made to move.It is not dangerous to be active, even though your back may hurt.Your back will likely heal faster if you return to being active before your pain is gone.  Pay attention to your body when you bend and lift. Many people have less discomfortwhen lifting if they bend their knees, keep the load close to their bodies,and  avoid twisting. Often, the most comfortable positions are those that put less stress on your recovering back.  Find a comfortable position to sleep. Use a firm mattress and lie on your side with your knees slightly bent. If you lie on your back, put a pillow under your knees.  Only take over-the-counter or prescription medicines as directed by your caregiver. Over-the-counter medicines to reduce pain and inflammation are often the most helpful.Your caregiver may prescribe muscle relaxant drugs.These medicines help dull your pain so you can more quickly return to your normal activities and healthy exercise.  Put ice on the injured area.  Put ice in a plastic bag.  Place a towel between your skin and the bag.  Leave the ice on for 15-20 minutes, 03-04 times a day for the first 2 to 3 days. After that, ice and heat may be alternated to reduce pain and spasms.  Ask your caregiver about trying back exercises and gentle massage. This may be of some benefit.  Avoid feeling anxious or stressed.Stress increases muscle tension and can worsen back pain.It is important to recognize when you are anxious or stressed and learn ways to manage it.Exercise is a great option. SEEK MEDICAL CARE IF:  You have pain that is not relieved with rest or medicine.  You have pain that does not improve in 1 week.  You have new symptoms.  You are generally not feeling well. SEEK   IMMEDIATE MEDICAL CARE IF:   You have pain that radiates from your back into your legs.  You develop new bowel or bladder control problems.  You have unusual weakness or numbness in your arms or legs.  You develop nausea or vomiting.  You develop abdominal pain.  You feel faint. Document Released: 05/23/2005 Document Revised: 11/22/2011 Document Reviewed: 10/11/2010 [employer] Patient Information 2014 [employer].  

## 2013-11-16 NOTE — ED Provider Notes (Signed)
CSN: [id number]     Arrival date & time 11/16/13  0850 History   First MD Initiated Contact with Patient 11/16/13 0900     Chief Complaint  Patient presents with  . Fall      HPI Patient reports tripping over his dog 2 days ago and falling down the stairs.  He states he slipped and tumbled down 11 stairs.  He reports pain in his low back.  She's been able to be late since then.  He states mild numbness in his right middle and index finger without pain in his right wrist right elbow right shoulder.  He has no new weakness.  He denies neck pain.  Denies upper back pain.  No chest pain or shortness of breath.  No weakness of his lower extremities.   Past Medical History  Diagnosis Date  . Diabetes mellitus   . Prostatitis   . Reflux    Past Surgical History  Procedure Laterality Date  . Tonsillectomy    . Adenoidectomy    . Knee surgery    . Wisdom tooth extraction     No family history on file. History  Substance Use Topics  . Smoking status: Current Every Day Smoker -- 0.50 packs/day for 15 years    Types: Cigarettes  . Smokeless tobacco: Not on file  . Alcohol Use: No    Review of Systems  All other systems reviewed and are negative.     Allergies  Clindamycin/lincomycin; Penicillins; and Pineapple  Home Medications   Prior to Admission medications   Medication Sig Start Date End Date Taking? Authorizing Provider  cyclobenzaprine (FLEXERIL) 10 MG tablet Take 1 tablet (10 mg total) by mouth 3 (three) times daily as needed for muscle spasms. 11/16/13   Lyanne CoKevin M Thereasa Iannello, MD  HYDROcodone-acetaminophen (NORCO) 5-325 MG per tablet Take 1 tablet by mouth every 6 (six) hours as needed. 08/25/13   Elson AreasLeslie K Sofia, PA-C  HYDROcodone-acetaminophen (NORCO/VICODIN) 5-325 MG per tablet Take 2 tablets by mouth every 4 (four) hours as needed. 07/29/13   Elson AreasLeslie K Sofia, PA-C  HYDROcodone-acetaminophen (NORCO/VICODIN) 5-325 MG per tablet Take 1-2 tablets by mouth every 4 (four) hours as  needed. 10/14/13   Gwyneth SproutWhitney Plunkett, MD  ibuprofen (ADVIL,MOTRIN) 800 MG tablet Take 1 tablet (800 mg total) by mouth 3 (three) times daily. 07/29/13   Elson AreasLeslie K Sofia, PA-C  naproxen (NAPROSYN) 500 MG tablet Take 1 tablet (500 mg total) by mouth 2 (two) times daily. 11/16/13   Lyanne CoKevin M Holten Spano, MD  ondansetron (ZOFRAN ODT) 4 MG disintegrating tablet Take 1 tablet (4 mg total) by mouth every 8 (eight) hours as needed for nausea or vomiting. 08/22/13   Gerhard Munchobert Lockwood, MD  ondansetron (ZOFRAN) 4 MG tablet Take 1 tablet (4 mg total) by mouth every 6 (six) hours. 10/14/13   Gwyneth SproutWhitney Plunkett, MD  pantoprazole (PROTONIX) 20 MG tablet Take 1 tablet (20 mg total) by mouth daily. 08/25/13   Elson AreasLeslie K Sofia, PA-C  ranitidine (ZANTAC) 150 MG capsule Take 1 capsule (150 mg total) by mouth 2 (two) times daily. 10/14/13   Gwyneth SproutWhitney Plunkett, MD  sucralfate (CARAFATE) 1 GM/10ML suspension Take 10 mLs (1 g total) by mouth 4 (four) times daily -  with meals and at bedtime. 10/14/13   Gwyneth SproutWhitney Plunkett, MD   BP 146/78  Pulse 106  Temp(Src) 98.3 F (36.8 C) (Oral)  Resp 20  Ht 5\' 9"  (1.753 m)  Wt 210 lb (95.255 kg)  BMI 31.00 kg/m2  SpO2  99% Physical Exam  Nursing note and vitals reviewed. Constitutional: He is oriented to person, place, and time. He appears well-developed and well-nourished.  HENT:  Head: Normocephalic and atraumatic.  Eyes: EOM are normal.  Neck: Normal range of motion.  No C-spine tenderness.  C-spine cleared by Nexus criteria.  Cardiovascular: Normal rate, regular rhythm, normal heart sounds and intact distal pulses.   Pulmonary/Chest: Effort normal and breath sounds normal. No respiratory distress.  Abdominal: Soft. He exhibits no distension. There is no tenderness.  Musculoskeletal: Normal range of motion.  Full range of motion bilateral wrists elbows and shoulders.  Patient with 5 out of 5 strength in his lower extremities.  Patient with no thoracic tenderness but mild tenderness around L1-L2  without step-off.  No coccygeal pain.  Full range of motion bilateral hips.  Neurological: He is alert and oriented to person, place, and time.  Skin: Skin is warm and dry.  Psychiatric: He has a normal mood and affect. Judgment normal.    ED Course  Procedures (including critical care time) Labs Review Labs Reviewed - No data to display  Imaging Review Dg Lumbar Spine Complete  11/16/2013   CLINICAL DATA:  Back pain after tripping over dog  EXAM: LUMBAR SPINE - COMPLETE 4+ VIEW  COMPARISON:  07/01/2012  FINDINGS: No fracture or destructive process. Normal spinal alignment. There is facet osteoarthritis at L5-S1, with spurring present on the right more than left. No spondylolysis. No significant disc narrowing.  IMPRESSION: 1. No acute osseous findings. 2. L5-S1 facet osteoarthritis.   Electronically Signed   By: Tiburcio PeaJonathan  Watts M.D.   On: 11/16/2013 10:17     EKG Interpretation None      MDM   Final diagnoses:  Fall  Lumbar back pain   Likely contusion and muscle strain/spasm.  With anti-inflammatories and muscle relaxants.     Lyanne CoKevin M Nazaret Chea, MD 11/16/13 1024

## 2014-01-05 ENCOUNTER — Emergency Department (HOSPITAL_BASED_OUTPATIENT_CLINIC_OR_DEPARTMENT_OTHER)
Admission: EM | Admit: 2014-01-05 | Discharge: 2014-01-05 | Disposition: A | Discharge status: Home / Self Care | Payer: Self-pay | Attending: Emergency Medicine | Admitting: Emergency Medicine

## 2014-01-05 DIAGNOSIS — Z87448 Personal history of other diseases of urinary system: Secondary | ICD-10-CM | POA: Insufficient documentation

## 2014-01-05 DIAGNOSIS — Z880 Allergy status to penicillin: Secondary | ICD-10-CM | POA: Insufficient documentation

## 2014-01-05 DIAGNOSIS — R5383 Other fatigue: Secondary | ICD-10-CM

## 2014-01-05 DIAGNOSIS — L03319 Cellulitis of trunk, unspecified: Principal | ICD-10-CM

## 2014-01-05 DIAGNOSIS — R5381 Other malaise: Secondary | ICD-10-CM | POA: Insufficient documentation

## 2014-01-05 DIAGNOSIS — Z8719 Personal history of other diseases of the digestive system: Secondary | ICD-10-CM | POA: Insufficient documentation

## 2014-01-05 DIAGNOSIS — E119 Type 2 diabetes mellitus without complications: Secondary | ICD-10-CM | POA: Insufficient documentation

## 2014-01-05 DIAGNOSIS — L0291 Cutaneous abscess, unspecified: Secondary | ICD-10-CM

## 2014-01-05 DIAGNOSIS — Z79899 Other long term (current) drug therapy: Secondary | ICD-10-CM | POA: Insufficient documentation

## 2014-01-05 DIAGNOSIS — L02219 Cutaneous abscess of trunk, unspecified: Secondary | ICD-10-CM | POA: Insufficient documentation

## 2014-01-05 DIAGNOSIS — F17200 Nicotine dependence, unspecified, uncomplicated: Secondary | ICD-10-CM | POA: Insufficient documentation

## 2014-01-05 MED ORDER — LIDOCAINE-EPINEPHRINE-TETRACAINE (LET) SOLUTION
3.0000 mL | Freq: Once | NASAL | Status: DC
  Filled 2014-01-05: qty 3

## 2014-01-05 MED ORDER — OXYCODONE-ACETAMINOPHEN 5-325 MG PO TABS
1.0000 | ORAL_TABLET | Freq: Once | ORAL | Status: AC
  Administered 2014-01-05: 1 via ORAL
  Filled 2014-01-05: qty 1

## 2014-01-05 MED ORDER — CEPHALEXIN 500 MG PO CAPS: ORAL_CAPSULE | ORAL | Status: DC

## 2014-01-05 MED ORDER — OXYCODONE-ACETAMINOPHEN 5-325 MG PO TABS: 1.0000 | ORAL_TABLET | Freq: Four times a day (QID) | ORAL | Status: DC | PRN

## 2014-01-05 NOTE — ED Notes (Signed)
Patient here with large abscess to chest x 2 weeks, redness noted without drainage. Warm to touch, reports chills with same

## 2014-01-05 NOTE — ED Provider Notes (Signed)
CSN: [id number]     Arrival date & time 01/05/14  1046 History   First MD Initiated Contact with Patient 01/05/14 1202     Chief Complaint  Patient presents with  . Abscess     (Consider location/radiation/quality/duration/timing/severity/associated sxs/prior Treatment) HPI Comments: Terrance Hodges is a 38 y.o. Male with a PMHx of prostatitis currently on Bactrim DS BID, presenting today With an abscess over his left chest approximately pinball sized, which he noticed 2 weeks prior to arrival, but he states that it has slowly become more red, warm, and more painful and enlarged slightly. He states that the pain is achy, constant, worse with palpation, nonradiating, with no alleviating factors he has not tried anything for this pain. He states he was recently started on Bactrim DS for prostatitis, proximally one week ago, which he is currently compliant with. He denies any red streaking, drainage, fevers, chills, abdominal pain, nausea, vomiting, diarrhea. He has felt mildly fatigued over the last few months, but he has not seen his regular doctor for this. Patient denies any insect bites or skin injury to this area that he is aware of. He denies shaving his chest. He denies any other areas of swelling in his armpits or groin. Patient denies a history of diabetes, although this is in his medical history. Patient denies any IV drug use, is a current smoker, but does not drink or use illicit drugs. 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  Patient is a 38 y.o. male presenting with abscess. The history is provided by the patient. No language interpreter was used.  Abscess Location:  Torso Torso abscess location:  L chest Size:  "pin ball" Abscess quality: induration, painful, redness and warmth   Abscess quality: not draining, no fluctuance, no itching and not weeping   Red streaking: no   Duration:  2 weeks Progression:  Worsening Pain details:    Quality:  Aching  Severity:  Mild   Duration:  1 week   Timing:  Constant   Progression:  Worsening Chronicity:  New Context: not diabetes (denies hx of diabetes), not immunosuppression, not injected drug use, not insect bite/sting and not skin injury   Relieved by:  None tried Exacerbated by: palpation. Ineffective treatments:  None tried Associated symptoms: fatigue (chronic and ongoing x months)   Associated symptoms: no fever, no headaches, no nausea and no vomiting   Risk factors: hx of MRSA   Risk factors: no prior abscess     Past Medical History  Diagnosis Date  . Diabetes mellitus   . Prostatitis   . Reflux    Past Surgical History  Procedure Laterality Date  . Tonsillectomy    . Adenoidectomy    . Knee surgery    . Wisdom tooth extraction     No family history on file. History  Substance Use Topics  . Smoking status: Current Every Day Smoker -- 0.50 packs/day for 15 years    Types: Cigarettes  . Smokeless tobacco: Not on file  . Alcohol Use: No    Review of Systems  Constitutional: Positive for fatigue (chronic and ongoing x months). Negative for fever and chills.  HENT: Negative for facial swelling and sore throat.   Respiratory: Negative for cough and shortness of breath.   Cardiovascular: Negative for chest pain.  Gastrointestinal: Negative for nausea and vomiting.  Endocrine: Negative for polydipsia and polyuria.  Genitourinary: Negative for dysuria and hematuria.  Musculoskeletal: Negative  for arthralgias, back pain, myalgias, neck pain and neck stiffness.  Skin: Positive for color change.  Allergic/Immunologic: Negative for immunocompromised state.  Neurological: Negative for dizziness, syncope, weakness, numbness and headaches.  10 Systems reviewed and are negative for acute change except as noted in the HPI.     Allergies  Clindamycin/lincomycin; Penicillins; and Pineapple  Home Medications   Prior to Admission medications   Medication Sig Start Date End  Date Taking? Authorizing Provider  cephALEXin (KEFLEX) 500 MG capsule 2 caps po bid x 7 days 01/05/14   Donnita Falls Camprubi-Soms, PA-C  oxyCODONE-acetaminophen (PERCOCET) 5-325 MG per tablet Take 1-2 tablets by mouth every 6 (six) hours as needed for severe pain. 01/05/14   Filippa Yarbough Strupp Camprubi-Soms, PA-C   BP 120/79  Pulse 101  Temp(Src) 98.6 F (37 C) (Oral)  Resp 18  Ht 5\' 10"  (1.778 m)  Wt 212 lb (96.163 kg)  BMI 30.42 kg/m2  SpO2 97% Physical Exam  Nursing note and vitals reviewed. Constitutional: He is oriented to person, place, and time. Vital signs are normal. He appears well-developed and well-nourished.  Non-toxic appearance. No distress.  VSS, afebrile, nontoxic, NAD  HENT:  Head: Normocephalic and atraumatic.  Mouth/Throat: Mucous membranes are normal.  Eyes: Conjunctivae and EOM are normal. Right eye exhibits no discharge. Left eye exhibits no discharge.  Neck: Normal range of motion. Neck supple. No spinous process tenderness and no muscular tenderness present. No rigidity. Normal range of motion present.  Cardiovascular: Normal rate, regular rhythm, normal heart sounds and intact distal pulses.  Exam reveals no gallop and no friction rub.   No murmur heard. HR 90s during exam  Pulmonary/Chest: Effort normal and breath sounds normal. No respiratory distress. He has no decreased breath sounds. He has no wheezes. He has no rhonchi. He has no rales. He exhibits mass and tenderness. He exhibits no crepitus, no swelling and no retraction. Right breast exhibits no inverted nipple, no mass, no nipple discharge, no skin change and no tenderness. Left breast exhibits no inverted nipple, no mass, no nipple discharge, no skin change and no tenderness.    Small, tender, erythematous, approx 4cm in diameter mass, well circumscribed, over L chest wall approx overlying 3rd rib. No bony TTP. No surrounding cellulitis, no warmth to the touch. Area does not feel fluctuant or indurated  therefore bedside u/s was performed which demonstrated a fluid pocket but could not determine whether it was cystic or abscess.  Abdominal: Soft. Normal appearance and bowel sounds are normal. He exhibits no distension. There is no tenderness. There is no rigidity, no rebound, no guarding and no CVA tenderness.  Musculoskeletal: Normal range of motion.  Moving all extremities with ease  Lymphadenopathy:    He has no cervical adenopathy.    He has no axillary adenopathy.  No cervical or axillary LAD  Neurological: He is alert and oriented to person, place, and time.  Skin: Skin is warm, dry and intact. No rash noted. There is erythema.     Small, tender, erythematous, approx 4cm in diameter mass, well circumscribed, over L chest wall approx overlying 3rd rib. No surrounding cellulitis, no warmth to the touch. Area does not feel fluctuant or indurated therefore bedside u/s was performed which demonstrated a fluid pocket but could not determine whether it was cystic or abscess.  Psychiatric: He has a normal mood and affect.    ED Course  INCISION AND DRAINAGE Date/Time: 01/05/2014 1:48 PM Performed by: Marjean Donna, Ameris Akamine STRUPP Authorized by: CAMPRUBI-SOMS,  Marquia Costello STRUPP Consent: Verbal consent obtained. Risks and benefits: risks, benefits and alternatives were discussed Consent given by: patient Patient understanding: patient states understanding of the procedure being performed Patient consent: the patient's understanding of the procedure matches consent given Patient identity confirmed: verbally with patient Type: abscess Body area: trunk Location details: chest Anesthesia: local infiltration Local anesthetic: lidocaine 2% without epinephrine and LET (lido,epi,tetracaine) Anesthetic total: 1 ml Patient sedated: no Scalpel size: 11 Needle gauge: 22 Incision type: single straight Complexity: simple Drainage: purulent Drainage amount: moderate Wound treatment: wound left  open Patient tolerance: Patient tolerated the procedure well with no immediate complications.   (including critical care time) Labs Review Labs Reviewed  WOUND CULTURE    Imaging Review No results found.   EKG Interpretation None      MDM   Final diagnoses:  Abscess    38y/o male presenting with an abscess over his left chest wall x2 weeks. He states that it grown slightly in size and become more painful and red. At first, I was unsure if this area was abscess versus a cyst. A bedside ultrasound revealed the ED in fact have some fluid within this pocket, and it appeared to have some sediment. Proceeded with application of let, and fine-needle aspiration. Initial aspiration not finding anything, and then eventually having some chunky purulent material within the needle. At that point I proceeded with a I&D given that I believe this was an abscess. Moderate amount of periodic discharge was removed. No packing was placed. Wound culture was sent. Patient does have a history of MRSA, but denied history of diabetes. Patient was arguing Bactrim, therefore will start Keflex today for better skin for coverage. Discussed followup in 2 days with his PCP for wound recheck. Doubt that this area was a lymph node concerning for cancer/lymphoma, but given the patient's endorsement of months the fatigue, I advised patient to followup with a primary care doctor for further evaluation. He did not feel the need to do CBC with differential at this time, given the patient was afebrile and nontoxic-appearing. I explained the diagnosis and have given explicit precautions to return to the ER including for any other new or worsening symptoms. The patient understands and accepts the medical plan as it's been dictated and I have answered their questions. Discharge instructions concerning home care and prescriptions have been given. The patient is STABLE and is discharged to home in good condition.  BP 120/79  Pulse  101  Temp(Src) 98.6 F (37 C) (Oral)  Resp 18  Ht 5\' 10"  (1.778 m)  Wt 212 lb (96.163 kg)  BMI 30.42 kg/m2  SpO2 97%  Meds ordered this encounter  Medications  . oxyCODONE-acetaminophen (PERCOCET/ROXICET) 5-325 MG per tablet 1 tablet    Sig:   . lidocaine-EPINEPHrine-tetracaine (LET) solution    Sig:   . oxyCODONE-acetaminophen (PERCOCET) 5-325 MG per tablet    Sig: Take 1-2 tablets by mouth every 6 (six) hours as needed for severe pain.    Dispense:  10 tablet    Refill:  0    Order Specific Question:  Supervising Provider    Answer:  Eber HongMILLER, BRIAN D [[id number]]  . cephALEXin (KEFLEX) 500 MG capsule    Sig: 2 caps po bid x 7 days    Dispense:  28 capsule    Refill:  0    Order Specific Question:  Supervising Provider    Answer:  Eber HongMILLER, BRIAN D [[id number]]       Abb Gobert Strupp Camprubi-Soms,  PA-C 01/05/14 1624

## 2014-01-05 NOTE — Discharge Instructions (Signed)
Keep wound clean and dry. Apply warm compresses to affected area throughout the day. Take antibiotic until it is finished. Take percocet as directed, as needed for pain but do not drive or operate machinery with pain medication use. Followup with Urgent Care/Primary Care doctor in 2 days for wound recheck.  Return to emergency department for emergent changing or worsening symptoms.   Abscess An abscess (boil or furuncle) is an infected area on or under the skin. This area is filled with yellowish-white fluid (pus) and other material (debris). HOME CARE   Only take medicines as told by your doctor.  If you were given antibiotic medicine, take it as directed. Finish the medicine even if you start to feel better.  If gauze is used, follow your doctor's directions for changing the gauze.  To avoid spreading the infection:  Keep your abscess covered with a bandage.  Wash your hands well.  Do not share personal care items, towels, or whirlpools with others.  Avoid skin contact with others.  Keep your skin and clothes clean around the abscess.  Keep all doctor visits as told. GET HELP RIGHT AWAY IF:   You have more pain, puffiness (swelling), or redness in the wound site.  You have more fluid or blood coming from the wound site.  You have muscle aches, chills, or you feel sick.  You have a fever. MAKE SURE YOU:   Understand these instructions.  Will watch your condition.  Will get help right away if you are not doing well or get worse. Document Released: 11/09/2007 Document Revised: 11/22/2011 Document Reviewed: 08/05/2011 [facility] Patient Information 2015 [place], [place]. This information is not intended to replace advice given to you by your health care provider. Make sure you discuss any questions you have with your health care provider.  Incision and Drainage Incision and drainage is a procedure in which a sac-like structure (cystic structure) is opened and drained. The  area to be drained usually contains material such as pus, fluid, or blood.  LET YOUR CAREGIVER KNOW ABOUT:   Allergies to medicine.  Medicines taken, including vitamins, herbs, eyedrops, over-the-counter medicines, and creams.  Use of steroids (by mouth or creams).  Previous problems with anesthetics or numbing medicines.  History of bleeding problems or blood clots.  Previous surgery.  Other health problems, including diabetes and kidney problems.  Possibility of pregnancy, if this applies. RISKS AND COMPLICATIONS  Pain.  Bleeding.  Scarring.  Infection. BEFORE THE PROCEDURE  You may need to have an ultrasound or other imaging tests to see how large or deep your cystic structure is. Blood tests may also be used to determine if you have an infection or how severe the infection is. You may need to have a tetanus shot. PROCEDURE  The affected area is cleaned with a cleaning fluid. The cyst area will then be numbed with a medicine (local anesthetic). A small incision will be made in the cystic structure. A syringe or catheter may be used to drain the contents of the cystic structure, or the contents may be squeezed out. The area will then be flushed with a cleansing solution. After cleansing the area, it is often gently packed with a gauze or another wound dressing. Once it is packed, it will be covered with gauze and tape or some other type of wound dressing. AFTER THE PROCEDURE   Often, you will be allowed to go home right after the procedure.  You may be given antibiotic medicine to prevent or  heal an infection.  If the area was packed with gauze or some other wound dressing, you will likely need to come back in 1 to 2 days to get it removed.  The area should heal in about 14 days. Document Released: 11/16/2000 Document Revised: 11/22/2011 Document Reviewed: 07/18/2011 [facility] Patient Information 2015 [place], [place]. This information is not intended to replace advice given  to you by your health care provider. Make sure you discuss any questions you have with your health care provider.  MRSA Infection MRSA stands for methicillin-resistant Staphylococcus aureus. This type of infection is caused by Staphylococcus aureus bacteria that are no longer affected by the medicines used to kill them (drug resistant). Staphylococcus (staph) bacteria are normally found on the skin or in the nose of healthy people. In most cases, these bacteria do not cause infection. But if these resistant bacteria enter your body through a cut or sore, they can cause a serious infection on your skin or in other parts of your body. There is a slight chance that the staph on your skin or in your nose is MRSA. There are two types of MRSA infections:  Hospital-acquired MRSA is bacteria that you get in the hospital.  Community-acquired MRSA is bacteria that you get somewhere other than in a hospital. RISK FACTORS Hospital-acquired MRSA is more common. You could be at risk for this infection if you are in the hospital and you:  Have surgery or a procedure.  Have an IV access or a catheter tube placed in your body.  Have weak resistance to germs (weakened immune system).  Are elderly.  Are on kidney dialysis. You could be at risk for community-acquired MRSA if you have a break in your skin and come into contact with MRSA. This may happen if you:  Play sports where there is skin-to-skin contact.  Live in a crowded setting, like a dormitory or a [employer].  Share towels, razors, or sports equipment with other people. SYMPTOMS  Symptoms of hospital-acquired MRSA depend on where MRSA has spread. Symptoms may include:  Wound infection.  Skin infection.  Rash.  Pneumonia.  Fever and chills.  Difficulty breathing.  Chest pain. Community-acquired MRSA is most likely to start as a scratch or cut that becomes infected. Symptoms may include:  A pus-filled pimple.  A boil on  your skin.  Pus draining from your skin.  A sore (abscess) under your skin or somewhere in your body.  Fever with or without chills. DIAGNOSIS  The diagnosis of MRSA is made by taking a sample from an infected area and sending it to a lab for testing. A lab technician can grow (culture) MRSA and check it under a microscope. The cultured MRSA can be tested to see which type of antibiotic medicine will work to treat it. Newer tests can identify MRSA more quickly by testing bacteria samples for MRSA genes. Your health care provider can diagnose MRSA using samples from:   Cuts or wounds in infected areas.  Nasal swabs.  Saliva or cough specimens from deep in the lungs (sputum).  Urine.  Blood. You may also have:  Imaging studies (such as X-ray or MRI) to check if the infection has spread to the lungs, bones, or joints.  A culture and sensitivity test of blood or fluids from inside the joints. TREATMENT  Treatment depends on how severe, deep, or extensive the infection is. Very bad infections may require a hospital stay.  Some skin infections, such as a small  boil or sore (abscess), may be treated by draining pus from the site of the infection.  More extensive surgery to drain pus may be necessary for deeper or more widespread soft tissue infections.  You may then have to take antibiotic medicine given by mouth or through a vein. You may start antibiotic treatment right away or after testing can be done to see what antibiotic medicine should be used. HOME CARE INSTRUCTIONS   Take your antibiotics as directed by your health care provider. Take the medicine as prescribed until it is finished.  Avoid close contact with those around you as much as possible. Do not use towels, razors, toothbrushes, bedding, or other items that will be used by others.  Wash your hands frequently for 15 seconds with soap and water. Dry your hands with a clean or disposable towel.  When you are not able to  wash your hands, use hand sanitizer that is more than 60 percent alcohol.  Wash towels, sheets, or clothes in the washing machine with detergent and hot water. Dry them in a hot dryer.  Follow your health care provider's instructions for wound care. Wash your hands before and after changing your bandages.  Always shower after exercising.  Keep all cuts and scrapes clean and covered with a bandage.  Be sure to tell all your health care providers that you have MRSA so they are aware of your infection. SEEK MEDICAL CARE IF:  You have a cut, scrape, pimple, or boil that becomes red, swollen, or painful or has pus in it.  You have pus draining from your skin.  You have an abscess under your skin or somewhere in your body. SEEK IMMEDIATE MEDICAL CARE IF:   You have symptoms of a skin infection with a fever or chills.  You have trouble breathing.  You have chest pain.  You have a skin wound and you become nauseous or start vomiting. MAKE SURE YOU:  Understand these instructions.  Will watch your condition.  Will get help right away if you are not doing well or get worse. Document Released: 05/23/2005 Document Revised: 05/28/2013 Document Reviewed: 03/15/2013 [facility] Patient Information 2015 [place], [place]. This information is not intended to replace advice given to you by your health care provider. Make sure you discuss any questions you have with your health care provider.  Emergency Department Resource Guide 1) Find a Doctor and Pay Out of Pocket Although you won't have to find out who is covered by your insurance plan, it is a good idea to ask around and get recommendations. You will then need to call the office and see if the doctor you have chosen will accept you as a new patient and what types of options they offer for patients who are self-pay. Some doctors offer discounts or will set up payment plans for their patients who do not have insurance, but you will need to ask so  you aren't surprised when you get to your appointment.  2) Contact Your Local Health Department Not all health departments have doctors that can see patients for sick visits, but many do, so it is worth a call to see if yours does. If you don't know where your local health department is, you can check in your phone book. The CDC also has a tool to help you locate your state's health department, and many state websites also have listings of all of their local health departments.  3) Find a Walk-in Clinic If your illness is not  likely to be very severe or complicated, you may want to try a walk in clinic. These are popping up all over the country in pharmacies, drugstores, and shopping centers. They're usually staffed by nurse practitioners or physician assistants that have been trained to treat common illnesses and complaints. They're usually fairly quick and inexpensive. However, if you have serious medical issues or chronic medical problems, these are probably not your best option.  No Primary Care Doctor: - Call Health Connect at  [phone] - they can help you locate a primary care doctor that  accepts your insurance, provides certain services, etc. - Physician Referral Service- [phone]  Chronic Pain Problems: Organization         Address  Phone   Notes  [facility]  [phone] Patients need to be referred by their primary care doctor.   Medication Assistance: Organization         Address  Phone   Notes  [facility] Medication [facility] [address] [place], [place] [address] [phone] --Must be a resident of [facility] -- Must have NO insurance coverage whatsoever (no Medicaid/ Medicare, etc.) -- The pt. MUST have a primary care doctor that directs their care regularly and follows them in the community   [facility]  [phone]   [employer]  [phone]    Agencies that provide inexpensive medical  care: Organization         Address  Phone   Notes  [facility]  [phone]   [facility]    [phone]   [facility] [address] [place], [place] [address] [phone]   [facility] [address], [place] [phone]   Planned Parenthood    [phone]   [facility]    [phone]   Community Health and [facility]  [address], [place] Phone:  [phone], Fax:  [phone] Hours of Operation:  9 am - 6 pm, M-F.  Also accepts Medicaid/Medicare and self-pay.  [facility]  [address], [place] Phone: [phone], Fax: [phone]. Hours of Operation:  8:30 am - 5:30 pm, M-F.  Also accepts Medicaid and self-pay.  [facility] [place] [address] [place] Phone: [phone]   [facility] [address] Natasha Bence [place], [place] [phone], Ext. [phone] Mondays & Thursdays: 7-9 AM.  First 15 patients are seen on a first come, first serve basis.    Medicaid-accepting [facility] Providers:  Organization         Address  Phone   Notes  [facility] [address], [place] [phone] Also accepts self-pay patients.  [facility] [address] Laurell Josephs [place], [place]  [phone]   [facility] [address], [place] [phone]   [facility] [address] [place] [phone]   Renaye Rakers [address], [place]   [phone] Only accepts [place] patients after they have their name applied to their card.   Self-Pay (no insurance) in [facility]:  Organization         Address  Phone   Notes  Sickle Cell Patients, [facility] [address] [place], [place] [phone]   [facility]  Urgent Care [address] [place],  [place] [phone]   [facility]  [address],  [phone]   [facility]/Dr. Osei-Bonsu  [address], [place] or [address], [place] [phone] Phone number for both [place] and [place] locations is the same.  Urgent Medical and [facility] [address] [place] [phone]   [facility] [address], [place] or [address] [phone]   [facility] [address] Adisa [address], [place] [phone], phone; [phone], fax Sees patients 1st and 3rd Saturday of every month.  Must not qualify for public or private insurance (i.e. Medicaid, Medicare, [place] Health Choice, Veterans' Benefits)  Household income should be no more than 200% of the poverty level The clinic cannot treat you if you are pregnant or think you are pregnant  Sexually transmitted diseases are not treated at the clinic.    Dental Care: Organization         Address  Phone  Notes  [facility] Department of [facility] [address] [place], [place] [phone] Accepts children up to age 68 who are enrolled in [place] or [place] Health Choice; pregnant women with a Medicaid card; and children who have applied for Medicaid or [place] Health Choice, but were declined, whose parents can pay a reduced fee at time of service.  [facility] Department of [facility]  [address], [place] [phone] Accepts children up to age 23 who are enrolled in [place] or [place] Health Choice; pregnant women with a Medicaid card; and children who have applied for Medicaid or [place] Health Choice, but were declined, whose parents can pay a reduced fee at time of service.  [facility]  [address] [place], [place] [phone] Patients are seen by appointment only. Walk-ins are not accepted. [facility] will see patients 21 years of age and older. Monday - Tuesday (8am-5pm) Most Wednesdays (8:30-5pm) $30 per visit, cash only  [facility] Adult Dental Access PROGRAM  [address], [facility] [phone] Patients are seen by appointment only. Walk-ins are not accepted. [facility] will see patients 59 years of age and older. One Wednesday Evening (Monthly: Volunteer Based).  $30 per visit, cash only  [employer]  [phone] for adults; Children under age 56, call Graduate Pediatric Dentistry at [phone]. Children aged 37-14, please call [phone] to request a pediatric application.  Dental services are provided in all areas of dental care including fillings, crowns and bridges, complete and partial dentures, implants, gum treatment, root canals, and extractions. Preventive care is also provided. Treatment is provided to both adults and children. Patients are selected via a lottery and there is often a waiting list.   [facility] [address] [place]  [phone] [email or web address]   [facility] [address] [place], [place] [phone], Ext. [phone] Second and Fourth Thursday of each month, opens at 6:30 AM; Clinic ends at 9 AM.  Patients are seen on a first-come first-served basis, and a limited number are seen during each clinic.   [facility]  [address] Kylena Mole Ether Griffins [place], [place] [phone]   Eligibility Requirements You must have lived in [place], [place], or [place] counties for at least the last three months.  You cannot be eligible for state or federal sponsored [place]healthcare insurance, including CIG[employer]eterans Administration, [place]icaid, or [employer]icare.   You generally cannot be eligible for healthcare insurance through your employer.    How to apply: Eligibility screenings are held every Tuesday and Wednesday afternoon from 1:00 pm until 4:00 pm. You do not need an appointment for the interview!   [facility] [address], [place], [place] [phone]   [facility]  [phone]   [facility]  [phone]   [facility]  [phone]    Behavioral Health Resources in the Community: Intensive Outpatient Programs Organization         Address  Phone  Notes  [facility] [address], [place], [place] [phone]   [facility] [address], [place], [place] [phone]   [facility] [address], [place], [place]  [phone]   [facility] [address],  [place], [place] [phone] or [phone]   Substance Abuse Resources Organization         Address  Phone  Notes  [facility]  [phone]   [facility]  [phone]   [facility]  [phone]   [facility]  [phone]   Residential & Outpatient Substance Abuse Program  [phone]   Psychological Services Organization         Address  Phone  Notes  [facility]  [phone]- [phone]   [facility]  [phone]- [phone]   [facility] [address], [place] [phone] or [phone]    Mobile Crisis Teams Organization         Address  Phone  Notes  [facility], Mobile Crisis Care Unit  [phone]   [facility]  [address] [place], [place] [phone]   Doristine LocksSharon DeEsch [address] [place] [phone]    Self-Help/Support Groups Organization         Address  Phone             Notes  [facility]oc[facility] of [place] - variety of support groups  [phone] Call for more information  Narcotics Anonymous (NA), Caring [facility] [address], [place]  2 meetings at this location   Statisticianesidential Treatment Programs Organization         Address  Phone  Notes  [facility] [address],    [place]  [phone]   [facility]  [address], [place] [phone], [place], [place] [phone]   [facility] [address], [place] [phone] Admissions: 8am-3pm M-F  [facility] [address]    [place], [place] [phone]   [facility] [address], [place], [place] [phone]   [facility] [address]  [place], [place] [phone]   [facility] - Intensive Outpatient [address], [place], [place] [phone]   [facility] ([facility] [address]  [place], [place] [phone] or [phone]   [facility] ([facility]) [address] [place], [place] [phone] Accepts Medicaid  [facility] [facility] [address]  [place] [phone] Substance Abuse/Addiction Treatment   [facility] Resources Organization         Address  Phone  Notes  [facility]  [phone]) [phone]   Angie FavaJulie Brannon, PhD [phone] [address], [place]   [phone] or ([place]) [place], [place] [phone]   [facility] [address], [place], [place] [phone] Insurance/Medicaid/sponsorship through [facility] and Families [address], [place] [phone] [place], [place] [phone]    Dr. Adele Schilder  [phone]   [facility] Dept. 1) [address]olo Tom[place], [place] 2) [place] 3)  [address], [place] [phone]  [phone]   [place] [phone] or [phone] (After Hours)

## 2014-01-07 NOTE — ED Provider Notes (Signed)
Medical screening examination/treatment/procedure(s) were performed by non-physician practitioner and as supervising physician I was immediately available for consultation/collaboration.   EKG Interpretation None        Junius ArgyleForrest S Marycarmen Hagey, MD 01/07/14 1327

## 2014-01-08 LAB — WOUND CULTURE: Special Requests: NORMAL

## 2014-05-05 ENCOUNTER — Encounter (HOSPITAL_COMMUNITY): Payer: Self-pay | Admitting: *Deleted

## 2014-05-05 ENCOUNTER — Emergency Department (HOSPITAL_COMMUNITY)
Admission: EM | Admit: 2014-05-05 | Discharge: 2014-05-05 | Discharge status: Against Medical Advice | Payer: Self-pay | Attending: Emergency Medicine | Admitting: Emergency Medicine

## 2014-05-05 DIAGNOSIS — R109 Unspecified abdominal pain: Secondary | ICD-10-CM | POA: Insufficient documentation

## 2014-05-05 DIAGNOSIS — E119 Type 2 diabetes mellitus without complications: Secondary | ICD-10-CM | POA: Insufficient documentation

## 2014-05-05 DIAGNOSIS — Z720 Tobacco use: Secondary | ICD-10-CM | POA: Insufficient documentation

## 2014-05-05 NOTE — ED Notes (Signed)
Pt states lower abdominal pain with shooting pain to right groin area and right knee. Denies any other symptoms.

## 2014-05-05 NOTE — ED Notes (Signed)
Pt notified registration they were leaving.  

## 2014-05-08 ENCOUNTER — Emergency Department (HOSPITAL_BASED_OUTPATIENT_CLINIC_OR_DEPARTMENT_OTHER): Payer: Self-pay

## 2014-05-08 ENCOUNTER — Emergency Department (HOSPITAL_BASED_OUTPATIENT_CLINIC_OR_DEPARTMENT_OTHER)
Admission: EM | Admit: 2014-05-08 | Discharge: 2014-05-08 | Disposition: A | Discharge status: Home / Self Care | Payer: Self-pay | Attending: Emergency Medicine | Admitting: Emergency Medicine

## 2014-05-08 ENCOUNTER — Encounter (HOSPITAL_BASED_OUTPATIENT_CLINIC_OR_DEPARTMENT_OTHER): Payer: Self-pay

## 2014-05-08 DIAGNOSIS — Z792 Long term (current) use of antibiotics: Secondary | ICD-10-CM | POA: Insufficient documentation

## 2014-05-08 DIAGNOSIS — Z720 Tobacco use: Secondary | ICD-10-CM | POA: Insufficient documentation

## 2014-05-08 DIAGNOSIS — E119 Type 2 diabetes mellitus without complications: Secondary | ICD-10-CM | POA: Insufficient documentation

## 2014-05-08 DIAGNOSIS — Z8719 Personal history of other diseases of the digestive system: Secondary | ICD-10-CM | POA: Insufficient documentation

## 2014-05-08 DIAGNOSIS — R1031 Right lower quadrant pain: Secondary | ICD-10-CM | POA: Insufficient documentation

## 2014-05-08 DIAGNOSIS — Z880 Allergy status to penicillin: Secondary | ICD-10-CM | POA: Insufficient documentation

## 2014-05-08 DIAGNOSIS — N451 Epididymitis: Secondary | ICD-10-CM | POA: Insufficient documentation

## 2014-05-08 HISTORY — DX: Personal history of other diseases of urinary system: Z87.448

## 2014-05-08 LAB — URINALYSIS, ROUTINE W REFLEX MICROSCOPIC
Bilirubin Urine: NEGATIVE
Glucose, UA: 250 mg/dL — AB
Ketones, ur: NEGATIVE mg/dL
Leukocytes, UA: NEGATIVE
Nitrite: NEGATIVE
Protein, ur: NEGATIVE mg/dL
SPECIFIC GRAVITY, URINE: 1.021 (ref 1.005–1.030)
Urobilinogen, UA: 0.2 mg/dL (ref 0.0–1.0)
pH: 5.5 (ref 5.0–8.0)

## 2014-05-08 LAB — CBC WITH DIFFERENTIAL/PLATELET
BASOS ABS: 0 10*3/uL (ref 0.0–0.1)
Band Neutrophils: 0 % (ref 0–10)
Basophils Relative: 0 % (ref 0–1)
Blasts: 0 %
Eosinophils Absolute: 0 10*3/uL (ref 0.0–0.7)
Eosinophils Relative: 0 % (ref 0–5)
HEMATOCRIT: 44.9 % (ref 39.0–52.0)
Hemoglobin: 15.9 g/dL (ref 13.0–17.0)
Lymphocytes Relative: 29 % (ref 12–46)
Lymphs Abs: 3.6 10*3/uL (ref 0.7–4.0)
MCH: 33.2 pg (ref 26.0–34.0)
MCHC: 35.4 g/dL (ref 30.0–36.0)
MCV: 93.7 fL (ref 78.0–100.0)
METAMYELOCYTES PCT: 1 %
MYELOCYTES: 0 %
Monocytes Absolute: 0.6 10*3/uL (ref 0.1–1.0)
Monocytes Relative: 5 % (ref 3–12)
Neutro Abs: 8.1 10*3/uL — ABNORMAL HIGH (ref 1.7–7.7)
Neutrophils Relative %: 65 % (ref 43–77)
Platelets: 269 10*3/uL (ref 150–400)
Promyelocytes Absolute: 0 %
RBC: 4.79 MIL/uL (ref 4.22–5.81)
RDW: 12.5 % (ref 11.5–15.5)
WBC: 12.3 10*3/uL — ABNORMAL HIGH (ref 4.0–10.5)
nRBC: 0 /100 WBC

## 2014-05-08 LAB — COMPREHENSIVE METABOLIC PANEL
ALK PHOS: 78 U/L (ref 39–117)
ALT: 49 U/L (ref 0–53)
AST: 23 U/L (ref 0–37)
Albumin: 3.8 g/dL (ref 3.5–5.2)
Anion gap: 15 (ref 5–15)
BUN: 15 mg/dL (ref 6–23)
CALCIUM: 9.5 mg/dL (ref 8.4–10.5)
CO2: 23 mEq/L (ref 19–32)
Chloride: 102 mEq/L (ref 96–112)
Creatinine, Ser: 0.8 mg/dL (ref 0.50–1.35)
Glucose, Bld: 150 mg/dL — ABNORMAL HIGH (ref 70–99)
POTASSIUM: 4.1 meq/L (ref 3.7–5.3)
Sodium: 140 mEq/L (ref 137–147)
TOTAL PROTEIN: 7.1 g/dL (ref 6.0–8.3)

## 2014-05-08 LAB — URINE MICROSCOPIC-ADD ON

## 2014-05-08 MED ORDER — MORPHINE SULFATE 4 MG/ML IJ SOLN
4.0000 mg | Freq: Once | INTRAMUSCULAR | Status: AC
  Administered 2014-05-08: 4 mg via INTRAVENOUS
  Filled 2014-05-08: qty 1

## 2014-05-08 MED ORDER — HYDROCODONE-ACETAMINOPHEN 5-325 MG PO TABS: 2.0000 | ORAL_TABLET | ORAL | Status: DC | PRN

## 2014-05-08 MED ORDER — LEVOFLOXACIN 500 MG PO TABS: 500.0000 mg | ORAL_TABLET | Freq: Every day | ORAL | Status: DC

## 2014-05-08 MED ORDER — IOHEXOL 300 MG/ML  SOLN
25.0000 mL | Freq: Once | INTRAMUSCULAR | Status: AC | PRN
  Administered 2014-05-08: 25 mL via ORAL

## 2014-05-08 MED ORDER — IOHEXOL 300 MG/ML  SOLN
100.0000 mL | Freq: Once | INTRAMUSCULAR | Status: AC | PRN
  Administered 2014-05-08: 100 mL via INTRAVENOUS

## 2014-05-08 NOTE — ED Provider Notes (Signed)
CSN: [id number]     Arrival date & time 05/08/14  1232 History   First MD Initiated Contact with Patient 05/08/14 1248     Chief Complaint  Patient presents with  . Groin Pain     (Consider location/radiation/quality/duration/timing/severity/associated sxs/prior Treatment) HPI Comments: Patient with past medical history of diabetes, prostatitis, and reflux presents to the emergency department with chief complaint of right sided groin pain. He states symptoms started about a week ago. He denies any fevers, chills, nausea, vomiting, diarrhea, constipation. He reports associated history and hematuria. He also complains pain on his right testicle. He reports having history of ureteral stents placed in 1997. 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 He denies taking anything for her symptoms. Symptoms are aggravated with movement and palpation.  The history is provided by the patient. No language interpreter was used.    Past Medical History  Diagnosis Date  . Diabetes mellitus   . Prostatitis   . Reflux   . History of hematuria    Past Surgical History  Procedure Laterality Date  . Tonsillectomy    . Adenoidectomy    . Knee surgery    . Wisdom tooth extraction    . Ureter surgery     No family history on file. History  Substance Use Topics  . Smoking status: Current Every Day Smoker -- 0.50 packs/day for 15 years    Types: Cigarettes  . Smokeless tobacco: Not on file  . Alcohol Use: No    Review of Systems  Constitutional: Negative for fever and chills.  Respiratory: Negative for shortness of breath.   Cardiovascular: Negative for chest pain.  Gastrointestinal: Positive for abdominal pain. Negative for nausea, vomiting, diarrhea and constipation.  Genitourinary: Positive for dysuria, hematuria and testicular pain.  All other systems reviewed and are negative.     Allergies  Clindamycin/lincomycin; Penicillins; and Pineapple  Home  Medications   Prior to Admission medications   Medication Sig Start Date End Date Taking? Authorizing Provider  cephALEXin (KEFLEX) 500 MG capsule 2 caps po bid x 7 days 01/05/14   Donnita FallsMercedes Strupp Camprubi-Soms, PA-C  oxyCODONE-acetaminophen (PERCOCET) 5-325 MG per tablet Take 1-2 tablets by mouth every 6 (six) hours as needed for severe pain. 01/05/14   Mercedes Strupp Camprubi-Soms, PA-C   BP 143/88 mmHg  Pulse 97  Temp(Src) 98.2 F (36.8 C) (Oral)  Resp 16  Ht 5\' 9"  (1.753 m)  Wt 250 lb (113.399 kg)  BMI 36.90 kg/m2  SpO2 100% Physical Exam  Constitutional: He is oriented to person, place, and time. He appears well-developed and well-nourished.  HENT:  Head: Normocephalic and atraumatic.  Eyes: Conjunctivae and EOM are normal. Pupils are equal, round, and reactive to light. Right eye exhibits no discharge. Left eye exhibits no discharge. No scleral icterus.  Neck: Normal range of motion. Neck supple. No JVD present.  Cardiovascular: Normal rate, regular rhythm and normal heart sounds.  Exam reveals no gallop and no friction rub.   No murmur heard. Pulmonary/Chest: Effort normal and breath sounds normal. No respiratory distress. He has no wheezes. He has no rales. He exhibits no tenderness.  Abdominal: Soft. He exhibits no distension and no mass. There is tenderness. There is no rebound and no guarding.  Right lower quadrant tender to palpation also tenderness palpation of the right inguinal canal, no other focal abdominal tenderness  Genitourinary:  Circumcised male, right testicle mildly tender to palpation over the posterior aspect, no other  abnormality, deformity, or lesion about the testicles or penis  Musculoskeletal: Normal range of motion. He exhibits no edema or tenderness.  Neurological: He is alert and oriented to person, place, and time.  Skin: Skin is warm and dry.  Psychiatric: He has a normal mood and affect. His behavior is normal. Judgment and thought content normal.   Nursing note and vitals reviewed.   ED Course  Procedures (including critical care time) Results for orders placed or performed during the hospital encounter of 05/08/14  Urinalysis, Routine w reflex microscopic  Result Value Ref Range   Color, Urine YELLOW YELLOW   APPearance CLEAR CLEAR   Specific Gravity, Urine 1.021 1.005 - 1.030   pH 5.5 5.0 - 8.0   Glucose, UA 250 (A) NEGATIVE mg/dL   Hgb urine dipstick MODERATE (A) NEGATIVE   Bilirubin Urine NEGATIVE NEGATIVE   Ketones, ur NEGATIVE NEGATIVE mg/dL   Protein, ur NEGATIVE NEGATIVE mg/dL   Urobilinogen, UA 0.2 0.0 - 1.0 mg/dL   Nitrite NEGATIVE NEGATIVE   Leukocytes, UA NEGATIVE NEGATIVE  CBC with Differential  Result Value Ref Range   WBC 12.3 (H) 4.0 - 10.5 K/uL   RBC 4.79 4.22 - 5.81 MIL/uL   Hemoglobin 15.9 13.0 - 17.0 g/dL   HCT 40.944.9 81.139.0 - 91.452.0 %   MCV 93.7 78.0 - 100.0 fL   MCH 33.2 26.0 - 34.0 pg   MCHC 35.4 30.0 - 36.0 g/dL   RDW 78.212.5 95.611.5 - 21.315.5 %   Platelets 269 150 - 400 K/uL   Neutrophils Relative % 65 43 - 77 %   Lymphocytes Relative 29 12 - 46 %   Monocytes Relative 5 3 - 12 %   Eosinophils Relative 0 0 - 5 %   Basophils Relative 0 0 - 1 %   Band Neutrophils 0 0 - 10 %   Metamyelocytes Relative 1 %   Myelocytes 0 %   Promyelocytes Absolute 0 %   Blasts 0 %   nRBC 0 0 /100 WBC   Neutro Abs 8.1 (H) 1.7 - 7.7 K/uL   Lymphs Abs 3.6 0.7 - 4.0 K/uL   Monocytes Absolute 0.6 0.1 - 1.0 K/uL   Eosinophils Absolute 0.0 0.0 - 0.7 K/uL   Basophils Absolute 0.0 0.0 - 0.1 K/uL   WBC Morphology ATYPICAL LYMPHOCYTES   Comprehensive metabolic panel  Result Value Ref Range   Sodium 140 137 - 147 mEq/L   Potassium 4.1 3.7 - 5.3 mEq/L   Chloride 102 96 - 112 mEq/L   CO2 23 19 - 32 mEq/L   Glucose, Bld 150 (H) 70 - 99 mg/dL   BUN 15 6 - 23 mg/dL   Creatinine, Ser 0.860.80 0.50 - 1.35 mg/dL   Calcium 9.5 8.4 - 57.810.5 mg/dL   Total Protein 7.1 6.0 - 8.3 g/dL   Albumin 3.8 3.5 - 5.2 g/dL   AST 23 0 - 37 U/L   ALT 49  0 - 53 U/L   Alkaline Phosphatase 78 39 - 117 U/L   Total Bilirubin <0.2 (L) 0.3 - 1.2 mg/dL   GFR calc non Af Amer >90 >90 mL/min   GFR calc Af Amer >90 >90 mL/min   Anion gap 15 5 - 15  Urine microscopic-add on  Result Value Ref Range   Squamous Epithelial / LPF RARE RARE   WBC, UA 0-2 <3 WBC/hpf   RBC / HPF 3-6 <3 RBC/hpf   Bacteria, UA RARE RARE   Urine-Other MUCOUS PRESENT  Ct Abdomen Pelvis W Contrast  05/08/2014   CLINICAL DATA:  Weakness, right lower quadrant pain for 2 weeks, couple of days diarrhea  EXAM: CT ABDOMEN AND PELVIS WITH CONTRAST  TECHNIQUE: Multidetector CT imaging of the abdomen and pelvis was performed using the standard protocol following bolus administration of intravenous contrast.  CONTRAST:  25mL OMNIPAQUE IOHEXOL 300 MG/ML SOLN, OMNIPAQUE IOHEXOL 300 MG/ML SOLN  COMPARISON:  08/22/2013  FINDINGS: Lung bases are unremarkable. Heart size within normal limits. Sagittal images of the spine are unremarkable.  Mild hepatic fatty infiltration. Gallbladder is contracted without evidence of calcified gallstones.  Kidneys are symmetrical in size and enhancement. No hydronephrosis or hydroureter.  Abdominal aorta is unremarkable.  No small bowel obstruction. No ascites or free air. No adenopathy. Terminal ileum is unremarkable. Normal appendix clearly visualized axial image 69. No pericecal inflammation.  No distal colonic obstruction. Moderate distended urinary bladder. Mild distension of distal right ureter without frank hydroureter. No pelvic ascites or adenopathy. No inguinal adenopathy. No destructive bony lesions are noted within pelvis.  Prostate gland and seminal vesicles are unremarkable.  IMPRESSION: 1. Mild hepatic fatty infiltration. 2. Normal appendix.  No pericecal inflammation. 3. Moderate distended urinary bladder. Mild distension of distal right ureter without hydroureter. No calcified ureteral calculi. 4. No small bowel obstruction. 5. No hydronephrosis.    Electronically Signed   By: Natasha Mead M.D.   On: 05/08/2014 14:30      EKG Interpretation None      MDM   Final diagnoses:  RLQ abdominal pain  Epididymitis    Patient with right-sided groin and lower abdominal pain 1 week. Reports associated dysuria and hematuria. History of ureteral stents. Will check labs, and imaging given the amount of pain. Will reassess.  Patient discussed with Dr. Gwendolyn Grant.  CT is largely unremarkable.  Labs and workup remarkable for leukocytosis and baseline hematuria.  Given the posterior testicle pain, will treat for epididymitis.  Age great than 35, less suspicious of STD, will treat with levaquin, but gc cultures pending.  Patient discussed with Dr. Gwendolyn Grant, who agrees with the plan.    Roxy Horseman, PA-C 05/08/14 1519  Elwin Mocha, MD 05/08/14 [phone]

## 2014-05-08 NOTE — ED Notes (Signed)
Reports lower abdominal pain and groin pain x 1 week. Nausea no vomiting

## 2014-05-08 NOTE — Discharge Instructions (Signed)
Epididymitis °Epididymitis is a swelling (inflammation) of the epididymis. The epididymis is a cord-like structure along the back part of the testicle. Epididymitis is usually, but not always, caused by infection. This is usually a sudden problem beginning with chills, fever and pain behind the scrotum and in the testicle. There may be swelling and redness of the testicle. °DIAGNOSIS  °Physical examination will reveal a tender, swollen epididymis. Sometimes, cultures are obtained from the urine or from prostate secretions to help find out if there is an infection or if the cause is a different problem. Sometimes, blood work is performed to see if your white blood cell count is elevated and if a germ (bacterial) or viral infection is present. Using this knowledge, an appropriate medicine which kills germs (antibiotic) can be chosen by your caregiver. A viral infection causing epididymitis will most often go away (resolve) without treatment. °HOME CARE INSTRUCTIONS  °· Hot sitz baths for 20 minutes, 4 times per day, may help relieve pain. °· Only take over-the-counter or prescription medicines for pain, discomfort or fever as directed by your caregiver. °· Take all medicines, including antibiotics, as directed. Take the antibiotics for the full prescribed length of time even if you are feeling better. °· It is very important to keep all follow-up appointments. °SEEK IMMEDIATE MEDICAL CARE IF:  °· You have a fever. °· You have pain not relieved with medicines. °· You have any worsening of your problems. °· Your pain seems to come and go. °· You develop pain, redness, and swelling in the scrotum and surrounding areas. °MAKE SURE YOU:  °· Understand these instructions. °· Will watch your condition. °· Will get help right away if you are not doing well or get worse. °Document Released: 05/20/2000 Document Revised: 08/15/2011 Document Reviewed: 04/09/2009 °ExitCare® Patient Information ©2015 [employer]. This information  is not intended to replace advice given to you by your health care provider. Make sure you discuss any questions you have with your health care provider. ° °

## 2014-05-10 LAB — GC/CHLAMYDIA PROBE AMP
CT PROBE, AMP APTIMA: NEGATIVE
GC Probe RNA: NEGATIVE

## 2014-05-13 ENCOUNTER — Emergency Department (HOSPITAL_COMMUNITY)
Admission: EM | Admit: 2014-05-13 | Discharge: 2014-05-13 | Disposition: A | Discharge status: Home / Self Care | Payer: Self-pay | Attending: Emergency Medicine | Admitting: Emergency Medicine

## 2014-05-13 ENCOUNTER — Encounter (HOSPITAL_COMMUNITY): Payer: Self-pay

## 2014-05-13 DIAGNOSIS — Z8719 Personal history of other diseases of the digestive system: Secondary | ICD-10-CM | POA: Insufficient documentation

## 2014-05-13 DIAGNOSIS — Z87448 Personal history of other diseases of urinary system: Secondary | ICD-10-CM | POA: Insufficient documentation

## 2014-05-13 DIAGNOSIS — E119 Type 2 diabetes mellitus without complications: Secondary | ICD-10-CM | POA: Insufficient documentation

## 2014-05-13 DIAGNOSIS — Z87891 Personal history of nicotine dependence: Secondary | ICD-10-CM | POA: Insufficient documentation

## 2014-05-13 DIAGNOSIS — N6081 Other benign mammary dysplasias of right breast: Secondary | ICD-10-CM | POA: Insufficient documentation

## 2014-05-13 DIAGNOSIS — Z880 Allergy status to penicillin: Secondary | ICD-10-CM | POA: Insufficient documentation

## 2014-05-13 DIAGNOSIS — Z79899 Other long term (current) drug therapy: Secondary | ICD-10-CM | POA: Insufficient documentation

## 2014-05-13 DIAGNOSIS — Z8614 Personal history of Methicillin resistant Staphylococcus aureus infection: Secondary | ICD-10-CM | POA: Insufficient documentation

## 2014-05-13 DIAGNOSIS — Z792 Long term (current) use of antibiotics: Secondary | ICD-10-CM | POA: Insufficient documentation

## 2014-05-13 MED ORDER — SULFAMETHOXAZOLE-TRIMETHOPRIM 800-160 MG PO TABS: 1.0000 | ORAL_TABLET | Freq: Two times a day (BID) | ORAL | Status: DC

## 2014-05-13 MED ORDER — LIDOCAINE HCL (PF) 1 % IJ SOLN
5.0000 mL | Freq: Once | INTRAMUSCULAR | Status: AC
  Administered 2014-05-13: 5 mL
  Filled 2014-05-13: qty 5

## 2014-05-13 NOTE — ED Notes (Signed)
Patient verbalizes understanding of discharge instructions, prescription medications, home care, and follow up care if needed. Patient ambulatory out of department at this time with family. 

## 2014-05-13 NOTE — Discharge Instructions (Signed)
Epidermal Cyst An epidermal cyst is sometimes called a sebaceous cyst, epidermal inclusion cyst, or infundibular cyst. These cysts usually contain a substance that looks "pasty" or "cheesy" and may have a bad smell. This substance is a protein called keratin. Epidermal cysts are usually found on the face, neck, or trunk. They may also occur in the vaginal area or other parts of the genitalia of both men and women. Epidermal cysts are usually small, painless, slow-growing bumps or lumps that move freely under the skin. It is important not to try to pop them. This may cause an infection and lead to tenderness and swelling. CAUSES  Epidermal cysts may be caused by a deep penetrating injury to the skin or a plugged hair follicle, often associated with acne. SYMPTOMS  Epidermal cysts can become inflamed and cause:  Redness.  Tenderness.  Increased temperature of the skin over the bumps or lumps.  Grayish-white, bad smelling material that drains from the bump or lump. DIAGNOSIS  Epidermal cysts are easily diagnosed by your caregiver during an exam. Rarely, a tissue sample (biopsy) may be taken to rule out other conditions that may resemble epidermal cysts. TREATMENT   Epidermal cysts often get better and disappear on their own. They are rarely ever cancerous.  If a cyst becomes infected, it may become inflamed and tender. This may require opening and draining the cyst. Treatment with antibiotics may be necessary. When the infection is gone, the cyst may be removed with minor surgery.  Small, inflamed cysts can often be treated with antibiotics or by injecting steroid medicines.  Sometimes, epidermal cysts become large and bothersome. If this happens, surgical removal in your caregiver's office may be necessary. HOME CARE INSTRUCTIONS  Only take over-the-counter or prescription medicines as directed by your caregiver.  Take your antibiotics as directed. Finish them even if you start to feel  better. SEEK MEDICAL CARE IF:   Your cyst becomes tender, red, or swollen.  Your condition is not improving or is getting worse.  You have any other questions or concerns. MAKE SURE YOU:  Understand these instructions.  Will watch your condition.  Will get help right away if you are not doing well or get worse. Document Released: 04/23/2004 Document Revised: 08/15/2011 Document Reviewed: 11/29/2010 ExitCare Patient Information 2015 [employer]. This information is not intended to replace advice given to you by your health care provider. Make sure you discuss any questions you have with your health care provider.  

## 2014-05-13 NOTE — ED Notes (Signed)
Pt reports has knot on r breast and area is red and swollen.  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

## 2014-05-14 NOTE — ED Provider Notes (Signed)
CSN: [id number]     Arrival date & time 05/13/14  1802 History   First MD Initiated Contact with Patient 05/13/14 1831     Chief Complaint  Patient presents with  . Abscess     (Consider location/radiation/quality/duration/timing/severity/associated sxs/prior Treatment) Patient is a 38 y.o. male presenting with abscess. The history is provided by the patient.  Abscess Location:  Torso Torso abscess location:  R chest Abscess quality: draining   Abscess quality comment:  Wife states she expressed a small amount of pus today Red streaking: no   Duration:  2 days Progression:  Worsening Chronicity:  New Context: not immunosuppression, not injected drug use and not skin injury   Context comment:  Has history of mrsa Relieved by:  Nothing Worsened by:  Nothing tried Ineffective treatments:  Warm compresses and draining/squeezing Associated symptoms: no fever, no nausea and no vomiting   Risk factors: hx of MRSA and prior abscess   Risk factors comment:  Reports similar abscess left chest wall which was previously lanced   Past Medical History  Diagnosis Date  . Diabetes mellitus   . Prostatitis   . Reflux   . History of hematuria    Past Surgical History  Procedure Laterality Date  . Tonsillectomy    . Adenoidectomy    . Knee surgery    . Wisdom tooth extraction    . Ureter surgery     No family history on file. History  Substance Use Topics  . Smoking status: Former Smoker -- 0.50 packs/day for 15 years    Types: Cigarettes  . Smokeless tobacco: Not on file  . Alcohol Use: No    Review of Systems  Constitutional: Negative for fever.  Gastrointestinal: Negative for nausea and vomiting.  Skin: Positive for color change.       Negative except as mentioned in HPI.         Allergies  Clindamycin/lincomycin; Penicillins; and Pineapple  Home Medications   Prior to Admission medications   Medication Sig Start Date End Date Taking? Authorizing Provider   cephALEXin (KEFLEX) 500 MG capsule 2 caps po bid x 7 days 01/05/14   Donnita FallsMercedes Strupp Camprubi-Soms, PA-C  HYDROcodone-acetaminophen (NORCO/VICODIN) 5-325 MG per tablet Take 2 tablets by mouth every 4 (four) hours as needed for moderate pain or severe pain. 05/08/14   Roxy Horsemanobert Browning, PA-C  levofloxacin (LEVAQUIN) 500 MG tablet Take 1 tablet (500 mg total) by mouth daily. 05/08/14   Roxy Horsemanobert Browning, PA-C  oxyCODONE-acetaminophen (PERCOCET) 5-325 MG per tablet Take 1-2 tablets by mouth every 6 (six) hours as needed for severe pain. 01/05/14   Mercedes Strupp Camprubi-Soms, PA-C  sulfamethoxazole-trimethoprim (SEPTRA DS) 800-160 MG per tablet Take 1 tablet by mouth every 12 (twelve) hours. 05/13/14   Burgess AmorJulie Renwick Asman, PA-C   BP 139/99 mmHg  Pulse 88  Temp(Src) 97.8 F (36.6 C) (Oral)  Resp 18  Ht 5\' 10"  (1.778 m)  Wt 215 lb (97.523 kg)  BMI 30.85 kg/m2  SpO2 95% Physical Exam  Constitutional: He appears well-developed and well-nourished. No distress.  HENT:  Head: Normocephalic.  Neck: Neck supple.  Cardiovascular: Normal rate.   Pulmonary/Chest: Effort normal. He has no wheezes.  Musculoskeletal: Normal range of motion. He exhibits no edema.  Skin:  2 cm area of erythema and induration right breast medial to the nipple.  No red streaking, 1 cm area of surrounding erythema.  No spontaneous drainage.    ED Course  Procedures (including critical care time)  INCISION AND  DRAINAGE Performed by: Burgess AmorIDOL, Bascom Biel Consent: Verbal consent obtained. Risks and benefits: risks, benefits and alternatives were discussed Type: abscess  Body area: right breast Anesthesia: local infiltration  Incision was made with a scalpel.  Local anesthetic: lidocaine 1% without epinephrine  Anesthetic total: 4 ml  Complexity: complex Blunt dissection to break up loculations  Drainage: purulent  Drainage amount: small amount of purulence,  Small amount of sebaceous material Packing material: no packing  Patient  tolerance: Patient tolerated the procedure well with no immediate complications.    Labs Review Labs Reviewed - No data to display  Imaging Review No results found.   EKG Interpretation None      MDM   Final diagnoses:  Sebaceous cyst of breast, right    Warm compresses, bactrim, prn f/u.  Pt advised to return here if this does not resolve or for worsened sx.    Burgess AmorJulie Garlon Tuggle, PA-C 05/14/14 1337  Vida RollerBrian D Miller, MD 05/15/14 [phone]

## 2014-05-28 ENCOUNTER — Encounter (HOSPITAL_COMMUNITY): Payer: Self-pay | Admitting: *Deleted

## 2014-05-28 ENCOUNTER — Emergency Department (HOSPITAL_COMMUNITY)
Admission: EM | Admit: 2014-05-28 | Discharge: 2014-05-29 | Disposition: A | Discharge status: Home / Self Care | Payer: Self-pay | Attending: Emergency Medicine | Admitting: Emergency Medicine

## 2014-05-28 DIAGNOSIS — E119 Type 2 diabetes mellitus without complications: Secondary | ICD-10-CM | POA: Insufficient documentation

## 2014-05-28 DIAGNOSIS — Z792 Long term (current) use of antibiotics: Secondary | ICD-10-CM | POA: Insufficient documentation

## 2014-05-28 DIAGNOSIS — Z79899 Other long term (current) drug therapy: Secondary | ICD-10-CM | POA: Insufficient documentation

## 2014-05-28 DIAGNOSIS — R197 Diarrhea, unspecified: Secondary | ICD-10-CM | POA: Insufficient documentation

## 2014-05-28 DIAGNOSIS — R112 Nausea with vomiting, unspecified: Secondary | ICD-10-CM | POA: Insufficient documentation

## 2014-05-28 DIAGNOSIS — Z8719 Personal history of other diseases of the digestive system: Secondary | ICD-10-CM | POA: Insufficient documentation

## 2014-05-28 DIAGNOSIS — Z87891 Personal history of nicotine dependence: Secondary | ICD-10-CM | POA: Insufficient documentation

## 2014-05-28 DIAGNOSIS — Z880 Allergy status to penicillin: Secondary | ICD-10-CM | POA: Insufficient documentation

## 2014-05-28 DIAGNOSIS — R1031 Right lower quadrant pain: Secondary | ICD-10-CM | POA: Insufficient documentation

## 2014-05-28 DIAGNOSIS — Z87448 Personal history of other diseases of urinary system: Secondary | ICD-10-CM | POA: Insufficient documentation

## 2014-05-28 LAB — CBC WITH DIFFERENTIAL/PLATELET
BASOS ABS: 0 10*3/uL (ref 0.0–0.1)
Basophils Relative: 0 % (ref 0–1)
Eosinophils Absolute: 0.1 10*3/uL (ref 0.0–0.7)
Eosinophils Relative: 1 % (ref 0–5)
HCT: 48.8 % (ref 39.0–52.0)
Hemoglobin: 17.4 g/dL — ABNORMAL HIGH (ref 13.0–17.0)
LYMPHS PCT: 25 % (ref 12–46)
Lymphs Abs: 2.7 10*3/uL (ref 0.7–4.0)
MCH: 33.5 pg (ref 26.0–34.0)
MCHC: 35.7 g/dL (ref 30.0–36.0)
MCV: 93.8 fL (ref 78.0–100.0)
Monocytes Absolute: 1.1 10*3/uL — ABNORMAL HIGH (ref 0.1–1.0)
Monocytes Relative: 10 % (ref 3–12)
NEUTROS ABS: 6.8 10*3/uL (ref 1.7–7.7)
NEUTROS PCT: 64 % (ref 43–77)
PLATELETS: 233 10*3/uL (ref 150–400)
RBC: 5.2 MIL/uL (ref 4.22–5.81)
RDW: 12.4 % (ref 11.5–15.5)
WBC: 10.6 10*3/uL — AB (ref 4.0–10.5)

## 2014-05-28 LAB — COMPREHENSIVE METABOLIC PANEL
ALT: 35 U/L (ref 0–53)
AST: 26 U/L (ref 0–37)
Albumin: 4.7 g/dL (ref 3.5–5.2)
Alkaline Phosphatase: 71 U/L (ref 39–117)
Anion gap: 9 (ref 5–15)
BILIRUBIN TOTAL: 0.5 mg/dL (ref 0.3–1.2)
BUN: 11 mg/dL (ref 6–23)
CALCIUM: 9.6 mg/dL (ref 8.4–10.5)
CHLORIDE: 103 meq/L (ref 96–112)
CO2: 29 mmol/L (ref 19–32)
Creatinine, Ser: 1.13 mg/dL (ref 0.50–1.35)
GFR calc Af Amer: >90 mL/min (ref >90)
GFR calc non Af Amer: 82 mL/min — ABNORMAL LOW (ref >90)
Glucose, Bld: 88 mg/dL (ref 70–99)
POTASSIUM: 3.8 mmol/L (ref 3.5–5.1)
Sodium: 141 mmol/L (ref 135–145)
Total Protein: 8 g/dL (ref 6.0–8.3)

## 2014-05-28 LAB — LIPASE, BLOOD: Lipase: 37 U/L (ref 11–59)

## 2014-05-28 NOTE — ED Notes (Signed)
Pt with lower abd pain for 4 days, + N/V/D

## 2014-05-29 ENCOUNTER — Emergency Department (HOSPITAL_COMMUNITY): Payer: Self-pay

## 2014-05-29 LAB — URINALYSIS, ROUTINE W REFLEX MICROSCOPIC
Bilirubin Urine: NEGATIVE
Glucose, UA: 100 mg/dL — AB
Ketones, ur: NEGATIVE mg/dL
LEUKOCYTES UA: NEGATIVE
Nitrite: NEGATIVE
Protein, ur: 30 mg/dL — AB
UROBILINOGEN UA: 0.2 mg/dL (ref 0.0–1.0)
pH: 5.5 (ref 5.0–8.0)

## 2014-05-29 LAB — URINE MICROSCOPIC-ADD ON

## 2014-05-29 MED ORDER — MORPHINE SULFATE 4 MG/ML IJ SOLN
4.0000 mg | Freq: Once | INTRAMUSCULAR | Status: AC
  Administered 2014-05-29: 4 mg via INTRAVENOUS
  Filled 2014-05-29: qty 1

## 2014-05-29 MED ORDER — HYOSCYAMINE SULFATE 0.125 MG SL SUBL: 0.2500 mg | SUBLINGUAL_TABLET | SUBLINGUAL | Status: DC | PRN

## 2014-05-29 MED ORDER — IOHEXOL 300 MG/ML  SOLN
100.0000 mL | Freq: Once | INTRAMUSCULAR | Status: AC | PRN
  Administered 2014-05-29: 100 mL via INTRAVENOUS

## 2014-05-29 MED ORDER — DIPHENOXYLATE-ATROPINE 2.5-0.025 MG PO TABS: 1.0000 | ORAL_TABLET | Freq: Four times a day (QID) | ORAL | Status: DC | PRN

## 2014-05-29 MED ORDER — PROMETHAZINE HCL 25 MG/ML IJ SOLN
12.5000 mg | Freq: Once | INTRAMUSCULAR | Status: AC
  Administered 2014-05-29: 12.5 mg via INTRAVENOUS

## 2014-05-29 MED ORDER — IOHEXOL 300 MG/ML  SOLN
25.0000 mL | Freq: Once | INTRAMUSCULAR | Status: AC | PRN
  Administered 2014-05-29: 25 mL via ORAL

## 2014-05-29 MED ORDER — ONDANSETRON HCL 4 MG/2ML IJ SOLN
4.0000 mg | Freq: Once | INTRAMUSCULAR | Status: AC
Start: 2014-05-29 — End: 2014-05-29
  Administered 2014-05-29: 4 mg via INTRAMUSCULAR
  Filled 2014-05-29: qty 2

## 2014-05-29 MED ORDER — PROMETHAZINE HCL 25 MG/ML IJ SOLN
INTRAMUSCULAR | Status: AC
  Filled 2014-05-29: qty 1

## 2014-05-29 MED ORDER — SODIUM CHLORIDE 0.9 % IV BOLUS (SEPSIS)
1000.0000 mL | Freq: Once | INTRAVENOUS | Status: AC
  Administered 2014-05-29: 1000 mL via INTRAVENOUS

## 2014-05-29 MED ORDER — PROMETHAZINE HCL 25 MG PO TABS: 25.0000 mg | ORAL_TABLET | Freq: Four times a day (QID) | ORAL | Status: DC | PRN

## 2014-05-29 MED ORDER — DIPHENOXYLATE-ATROPINE 2.5-0.025 MG PO TABS
2.0000 | ORAL_TABLET | Freq: Once | ORAL | Status: AC
  Administered 2014-05-29: 2 via ORAL
  Filled 2014-05-29: qty 2

## 2014-05-29 NOTE — ED Notes (Signed)
Discharge instructions given, pt demonstrated teach back and verbal understanding. No concerns voiced.  

## 2014-05-29 NOTE — Discharge Instructions (Signed)

## 2014-05-30 NOTE — ED Provider Notes (Signed)
CSN: [id number]     Arrival date & time 05/28/14  2124 History   First MD Initiated Contact with Patient 05/28/14 2323     Chief Complaint  Patient presents with  . Abdominal Pain     (Consider location/radiation/quality/duration/timing/severity/associated sxs/prior Treatment) The history is provided by the patient.   Terrance Hodges is a 38 y.o. male presenting with bilateral low abdominal pain associated with nausea, vomiting and diarrhea which has been persistent with for past 4 days.  He originally thought he had a viral infection, but his symptoms have not improved with time.  He has taken no medications including no anti diarrheal medicines.  He endorses increased weakness and fatigue, states he is probably dehydrated. He denies fevers, chills, hematemesis and bloody stools.  He has been passing clear water triggered by any oral intake since yesterday.  His pain is cramping, waxing and waning. He denies dysuria, cough, headache.  He denies foreign travel.  He was placed on a course of levaquin 3 weeks ago for epididymitis and then a course of bactrim 2 weeks ago for a skin abscess, both symptoms having improved.  His wife is asymptomatic.    Past Medical History  Diagnosis Date  . Diabetes mellitus   . Prostatitis   . Reflux   . History of hematuria    Past Surgical History  Procedure Laterality Date  . Tonsillectomy    . Adenoidectomy    . Knee surgery    . Wisdom tooth extraction    . Ureter surgery     History reviewed. No pertinent family history. History  Substance Use Topics  . Smoking status: Former Smoker -- 0.50 packs/day for 15 years    Types: Cigarettes  . Smokeless tobacco: Not on file  . Alcohol Use: No    Review of Systems  Constitutional: Negative for fever.  HENT: Negative for congestion and sore throat.   Eyes: Negative.   Respiratory: Negative for chest tightness and shortness of breath.   Cardiovascular: Negative for chest pain.  Gastrointestinal:  Positive for nausea, vomiting, abdominal pain and diarrhea.  Genitourinary: Negative.   Musculoskeletal: Negative for joint swelling, arthralgias and neck pain.  Skin: Negative.  Negative for rash and wound.  Neurological: Negative for dizziness, weakness, light-headedness, numbness and headaches.  Psychiatric/Behavioral: Negative.       Allergies  Clindamycin/lincomycin; Penicillins; and Pineapple  Home Medications   Prior to Admission medications   Medication Sig Start Date End Date Taking? Authorizing Provider  cephALEXin (KEFLEX) 500 MG capsule 2 caps po bid x 7 days 01/05/14   Donnita FallsMercedes Strupp Camprubi-Soms, PA-C  diphenoxylate-atropine (LOMOTIL) 2.5-0.025 MG per tablet Take 1 tablet by mouth 4 (four) times daily as needed for diarrhea or loose stools. 05/29/14   Burgess AmorJulie Jassmine Vandruff, PA-C  HYDROcodone-acetaminophen (NORCO/VICODIN) 5-325 MG per tablet Take 2 tablets by mouth every 4 (four) hours as needed for moderate pain or severe pain. 05/08/14   Roxy Horsemanobert Browning, PA-C  levofloxacin (LEVAQUIN) 500 MG tablet Take 1 tablet (500 mg total) by mouth daily. 05/08/14   Roxy Horsemanobert Browning, PA-C  oxyCODONE-acetaminophen (PERCOCET) 5-325 MG per tablet Take 1-2 tablets by mouth every 6 (six) hours as needed for severe pain. 01/05/14   Mercedes Strupp Camprubi-Soms, PA-C  promethazine (PHENERGAN) 25 MG tablet Take 1 tablet (25 mg total) by mouth every 6 (six) hours as needed for nausea or vomiting. 05/29/14   Burgess AmorJulie Dequavion Follette, PA-C  sulfamethoxazole-trimethoprim (SEPTRA DS) 800-160 MG per tablet Take 1 tablet by mouth every  12 (twelve) hours. 05/13/14   Burgess Amor, PA-C   BP 118/76 mmHg  Pulse 90  Temp(Src) 98.8 F (37.1 C) (Oral)  Resp 20  Ht 5\' 9"  (1.753 m)  Wt 215 lb (97.523 kg)  BMI 31.74 kg/m2  SpO2 98% Physical Exam  Constitutional: He appears well-developed and well-nourished.  HENT:  Head: Normocephalic and atraumatic.  Eyes: Conjunctivae are normal.  Neck: Normal range of motion.  Cardiovascular:  Normal rate, regular rhythm, normal heart sounds and intact distal pulses.   Pulmonary/Chest: Effort normal and breath sounds normal. He has no wheezes.  Abdominal: Soft. Bowel sounds are normal. He exhibits no distension. There is no hepatosplenomegaly. There is tenderness in the right lower quadrant and suprapubic area. There is rebound. There is no rigidity, no guarding and no tenderness at McBurney's point.  Musculoskeletal: Normal range of motion.  Neurological: He is alert.  Skin: Skin is warm and dry.  Psychiatric: He has a normal mood and affect.  Nursing note and vitals reviewed.   ED Course  Procedures (including critical care time) Labs Review Labs Reviewed  URINALYSIS, ROUTINE W REFLEX MICROSCOPIC - Abnormal; Notable for the following:    Color, Urine AMBER (*)    Specific Gravity, Urine >1.030 (*)    Glucose, UA 100 (*)    Hgb urine dipstick MODERATE (*)    Protein, ur 30 (*)    All other components within normal limits  CBC WITH DIFFERENTIAL - Abnormal; Notable for the following:    WBC 10.6 (*)    Hemoglobin 17.4 (*)    Monocytes Absolute 1.1 (*)    All other components within normal limits  COMPREHENSIVE METABOLIC PANEL - Abnormal; Notable for the following:    GFR calc non Af Amer 82 (*)    All other components within normal limits  URINE MICROSCOPIC-ADD ON - Abnormal; Notable for the following:    Bacteria, UA FEW (*)    Casts GRANULAR CAST (*)    All other components within normal limits  LIPASE, BLOOD    Imaging Review Ct Abdomen Pelvis W Contrast  05/29/2014   CLINICAL DATA:  Acute onset of right lower quadrant abdominal pain for 4 days. Nausea, vomiting and diarrhea. Initial encounter.  EXAM: CT ABDOMEN AND PELVIS WITH CONTRAST  TECHNIQUE: Multidetector CT imaging of the abdomen and pelvis was performed using the standard protocol following bolus administration of intravenous contrast.  CONTRAST:  25mL OMNIPAQUE IOHEXOL 300 MG/ML SOLN, OMNIPAQUE  IOHEXOL 300 MG/ML SOLN  COMPARISON:  CT of the abdomen and pelvis performed 05/08/2014  FINDINGS: The visualized lung bases are clear.  The liver and spleen are unremarkable in appearance. The gallbladder is within normal limits. The pancreas and adrenal glands are unremarkable.  The kidneys are unremarkable in appearance. There is no evidence of hydronephrosis. No renal or ureteral stones are seen. No perinephric stranding is appreciated.  No free fluid is identified. The small bowel is unremarkable in appearance. The stomach is within normal limits. No acute vascular abnormalities are seen.  The appendix is normal in caliber and contains air, without evidence for appendicitis. The colon is unremarkable in appearance.  The bladder is mildly distended and grossly unremarkable. The prostate remains normal in size. No inguinal lymphadenopathy is seen.  No acute osseous abnormalities are identified. Facet disease is noted on the right side at L5.  IMPRESSION: Unremarkable contrast-enhanced CT of the abdomen and pelvis.   Electronically Signed   By: Beryle Beams.D.  On: 05/29/2014 02:04     EKG Interpretation None      MDM   Final diagnoses:  RLQ abdominal pain  Nausea, vomiting, and diarrhea    Suspect viral syndrome.  No diarrhea or emesis while here.  He was given IV fluids and felt improved with this. Lomotil, phenergan prescribed.  BRAT diet discussed.  Increased fluid intake.   Patients labs and/or radiological studies were viewed and considered during the medical decision making and disposition process. Labs stable except for spec grav elevation of urine - dehydration without ketones.  Fluids.  Encouraged increased fluid intake.  No fevers, borderline normal wbc, doubt bacterial gastroenteritis/c diff.  Non bloody stools.  Prn f/u if sx are not improved with this tx.    Burgess AmorJulie Farren Nelles, PA-C 05/30/14 0103  Dione Boozeavid Glick, MD 05/30/14 [phone]

## 2014-06-30 ENCOUNTER — Emergency Department (HOSPITAL_COMMUNITY)
Admission: EM | Admit: 2014-06-30 | Discharge: 2014-06-30 | Disposition: A | Discharge status: Home / Self Care | Payer: Self-pay | Attending: Emergency Medicine | Admitting: Emergency Medicine

## 2014-06-30 ENCOUNTER — Emergency Department (HOSPITAL_COMMUNITY): Payer: Self-pay

## 2014-06-30 ENCOUNTER — Encounter (HOSPITAL_COMMUNITY): Payer: Self-pay | Admitting: Emergency Medicine

## 2014-06-30 DIAGNOSIS — S53401A Unspecified sprain of right elbow, initial encounter: Secondary | ICD-10-CM | POA: Insufficient documentation

## 2014-06-30 DIAGNOSIS — Z79899 Other long term (current) drug therapy: Secondary | ICD-10-CM | POA: Insufficient documentation

## 2014-06-30 DIAGNOSIS — Y998 Other external cause status: Secondary | ICD-10-CM | POA: Insufficient documentation

## 2014-06-30 DIAGNOSIS — Z8719 Personal history of other diseases of the digestive system: Secondary | ICD-10-CM | POA: Insufficient documentation

## 2014-06-30 DIAGNOSIS — Y9389 Activity, other specified: Secondary | ICD-10-CM | POA: Insufficient documentation

## 2014-06-30 DIAGNOSIS — E119 Type 2 diabetes mellitus without complications: Secondary | ICD-10-CM | POA: Insufficient documentation

## 2014-06-30 DIAGNOSIS — Z87448 Personal history of other diseases of urinary system: Secondary | ICD-10-CM | POA: Insufficient documentation

## 2014-06-30 DIAGNOSIS — Z87891 Personal history of nicotine dependence: Secondary | ICD-10-CM | POA: Insufficient documentation

## 2014-06-30 DIAGNOSIS — Y9289 Other specified places as the place of occurrence of the external cause: Secondary | ICD-10-CM | POA: Insufficient documentation

## 2014-06-30 DIAGNOSIS — Z792 Long term (current) use of antibiotics: Secondary | ICD-10-CM | POA: Insufficient documentation

## 2014-06-30 DIAGNOSIS — W108XXA Fall (on) (from) other stairs and steps, initial encounter: Secondary | ICD-10-CM | POA: Insufficient documentation

## 2014-06-30 DIAGNOSIS — W19XXXA Unspecified fall, initial encounter: Secondary | ICD-10-CM

## 2014-06-30 DIAGNOSIS — Z880 Allergy status to penicillin: Secondary | ICD-10-CM | POA: Insufficient documentation

## 2014-06-30 MED ORDER — OXYCODONE-ACETAMINOPHEN 5-325 MG PO TABS: 1.0000 | ORAL_TABLET | ORAL | Status: DC | PRN

## 2014-06-30 MED ORDER — IBUPROFEN 800 MG PO TABS
800.0000 mg | ORAL_TABLET | Freq: Once | ORAL | Status: AC
  Administered 2014-06-30: 800 mg via ORAL
  Filled 2014-06-30: qty 1

## 2014-06-30 MED ORDER — OXYCODONE-ACETAMINOPHEN 5-325 MG PO TABS
1.0000 | ORAL_TABLET | Freq: Once | ORAL | Status: AC
  Administered 2014-06-30: 1 via ORAL
  Filled 2014-06-30: qty 1

## 2014-06-30 MED ORDER — NAPROXEN 500 MG PO TABS: 500.0000 mg | ORAL_TABLET | Freq: Two times a day (BID) | ORAL | Status: DC

## 2014-06-30 NOTE — ED Notes (Signed)
PT stated he fell down 4 steps inside his house this morning and landed on his right arm. PT c/o right forearm and right elbow pain.

## 2014-06-30 NOTE — Discharge Instructions (Signed)
Sprain °A sprain happens when the bands of tissue that connect bones and hold joints together (ligaments) stretch too much or tear. °HOME CARE °· Raise (elevate) the injured area to lessen puffiness (swelling). °· Put ice on the injured area 2 times a day for 2-3 days. °¨ Put ice in a plastic bag. °¨ Place a towel between your skin and the bag. °¨ Leave the ice on for 15 minutes. °· Only take medicine as told by your doctor. °· Protect your injured area until your pain and stiffness go away. °· Do not get your cast or splint wet. Cover your cast or splint with a plastic bag when you shower or take a bath. Do not swim in a pool. °· Your doctor may suggest exercises during your recovery to keep from getting stiff. °GET HELP RIGHT AWAY IF:  °· Your cast or splint becomes damaged. °· Your pain gets worse. °MAKE SURE YOU:  °· Understand these instructions. °· Will watch this condition. °· Will get help right away if you are not doing well or get worse. °Document Released: 11/09/2007 Document Revised: 03/13/2013 Document Reviewed: 06/04/2011 °[employer]® Patient Information ©2015 [employer]. This information is not intended to replace advice given to you by your health care provider. Make sure you discuss any questions you have with your health care provider. ° °

## 2014-06-30 NOTE — ED Notes (Signed)
Pt alert & oriented x4, stable gait. Patient given discharge instructions, paperwork & prescription(s). Patient  instructed to stop at the registration desk to finish any additional paperwork. Patient verbalized understanding. Pt left department w/ no further questions. 

## 2014-06-30 NOTE — ED Provider Notes (Signed)
CSN: [id number]     Arrival date & time 06/30/14  0808 History   First MD Initiated Contact with Patient 06/30/14 0815     No chief complaint on file.    (Consider location/radiation/quality/duration/timing/severity/associated sxs/prior Treatment) HPI   Terrance Hodges is a 39 y.o. male who presents to the Emergency Department complaining of right elbow and forearm pain.  He states he slipped on the stairs inside his home and fell, landing on the elbow.  Pain is worse with extension and rotation and improves when held flexed and close to his body.  He has hx of previous injury that resulted in fracture and foreign bodies to same elbow in 2001.  He also reports intermittent tingling to his right third and fourth fingers.  He has not taken any medications or tried any therapies for his pain prior to arrival.  He denies swelling, numbness of the arm, rib pain, neck pain, head injury or [name].     Past Medical History  Diagnosis Date  . Diabetes mellitus   . Prostatitis   . Reflux   . History of hematuria    Past Surgical History  Procedure Laterality Date  . Tonsillectomy    . Adenoidectomy    . Knee surgery    . Wisdom tooth extraction    . Ureter surgery     No family history on file. History  Substance Use Topics  . Smoking status: Former Smoker -- 0.50 packs/day for 15 years    Types: Cigarettes  . Smokeless tobacco: Not on file  . Alcohol Use: No    Review of Systems  Constitutional: Negative for fever and chills.  Cardiovascular: Negative for chest pain.  Gastrointestinal: Negative for vomiting and abdominal pain.  Genitourinary: Negative for dysuria, flank pain and difficulty urinating.  Musculoskeletal: Positive for arthralgias (right elbow pain). Negative for back pain, joint swelling and neck pain.  Skin: Negative for color change and wound.  Neurological: Negative for dizziness, syncope, weakness, numbness and headaches.  All other systems reviewed and are  negative.     Allergies  Clindamycin/lincomycin; Penicillins; and Pineapple  Home Medications   Prior to Admission medications   Medication Sig Start Date End Date Taking? Authorizing Provider  cephALEXin (KEFLEX) 500 MG capsule 2 caps po bid x 7 days 01/05/14   Donnita FallsMercedes Strupp Camprubi-Soms, PA-C  diphenoxylate-atropine (LOMOTIL) 2.5-0.025 MG per tablet Take 1 tablet by mouth 4 (four) times daily as needed for diarrhea or loose stools. 05/29/14   Burgess AmorJulie Idol, PA-C  HYDROcodone-acetaminophen (NORCO/VICODIN) 5-325 MG per tablet Take 2 tablets by mouth every 4 (four) hours as needed for moderate pain or severe pain. 05/08/14   Roxy Horsemanobert Browning, PA-C  levofloxacin (LEVAQUIN) 500 MG tablet Take 1 tablet (500 mg total) by mouth daily. 05/08/14   Roxy Horsemanobert Browning, PA-C  oxyCODONE-acetaminophen (PERCOCET) 5-325 MG per tablet Take 1-2 tablets by mouth every 6 (six) hours as needed for severe pain. 01/05/14   Mercedes Strupp Camprubi-Soms, PA-C  promethazine (PHENERGAN) 25 MG tablet Take 1 tablet (25 mg total) by mouth every 6 (six) hours as needed for nausea or vomiting. 05/29/14   Burgess AmorJulie Idol, PA-C  sulfamethoxazole-trimethoprim (SEPTRA DS) 800-160 MG per tablet Take 1 tablet by mouth every 12 (twelve) hours. 05/13/14   Burgess AmorJulie Idol, PA-C   There were no vitals taken for this visit. Physical Exam  Constitutional: He is oriented to person, place, and time. He appears well-developed and well-nourished. No distress.  HENT:  Head: Normocephalic and atraumatic.  Neck: Normal range of motion. Neck supple. No spinous process tenderness and no muscular tenderness present.  Cardiovascular: Normal rate, regular rhythm, normal heart sounds and intact distal pulses.   No murmur heard. Pulmonary/Chest: Effort normal and breath sounds normal. No respiratory distress. He exhibits no tenderness.  Musculoskeletal: He exhibits tenderness. He exhibits no edema.  Diffuse tenderness of the right elbow.  No edema.  Radial pulse  is brisk, distal sensation intact.  CR< 2 sec.  No bruising or bony deformity.  Right shoulder and wrist are NT. Compartments soft.  Neurological: He is alert and oriented to person, place, and time. He exhibits normal muscle tone. Coordination normal.  Skin: Skin is warm and dry.  Nursing note and vitals reviewed.   ED Course  Procedures (including critical care time) Labs Review Labs Reviewed - No data to display  Imaging Review Dg Elbow Complete Right  06/30/2014   CLINICAL DATA:  Slip and fall down stairs with elbow pain, initial encounter  EXAM: RIGHT ELBOW - COMPLETE 3+ VIEW  COMPARISON:  10/23/2000  FINDINGS: No acute fracture or dislocation is noted. Two radiopaque densities are identified along the medial aspect of the knee elbow joint likely related to imbedded glass fragments. Correlation with previous report from 2002 suggested these are chronic in nature.  IMPRESSION: No acute abnormality noted.  Chronic foreign bodies within the medial soft tissues.   Electronically Signed   By: Alcide Clever M.D.   On: 06/30/2014 08:42   Dg Forearm Right  06/30/2014   CLINICAL DATA:  Initial encounter for medial right elbow and forearm pain after falling.  EXAM: RIGHT FOREARM - 2 VIEW  COMPARISON:  None.  FINDINGS: Two views of the right forearm are negative for fracture.  There are 2 radiopaque foreign bodies in the soft tissues at the medial aspect of the elbow, measuring approximately 3 x 4 mm each and located no deeper than 1 cm from the skin surface. The nature of these foreign bodies is indeterminate. Chronicity is indeterminate.  There is no soft tissue gas. No bone lesions or bony destruction evident.  IMPRESSION: Soft tissue foreign bodies at the medial aspect of the elbow, not characterized. No bony abnormalities.   Electronically Signed   By: Ellery Plunk M.D.   On: 06/30/2014 08:44     EKG Interpretation None      MDM   Final diagnoses:  Elbow sprain, right, initial encounter     XR results reviewed asnd discussed with pt.  He agrees to ice, sling and close orthopedic f/u.  Referral info given.  Pain improved after medication and sling application, remains NV intact.  Appears stable for d/c.  rx for percocet and naproxen    Caius Silbernagel L. Trisha Mangle, PA-C 06/30/14 1610  Flint Melter, MD 06/30/14 [phone]

## 2014-07-07 ENCOUNTER — Emergency Department (HOSPITAL_COMMUNITY)
Admission: EM | Admit: 2014-07-07 | Discharge: 2014-07-07 | Disposition: A | Discharge status: Home / Self Care | Payer: Self-pay | Attending: Emergency Medicine | Admitting: Emergency Medicine

## 2014-07-07 ENCOUNTER — Encounter (HOSPITAL_COMMUNITY): Payer: Self-pay | Admitting: *Deleted

## 2014-07-07 DIAGNOSIS — Z880 Allergy status to penicillin: Secondary | ICD-10-CM | POA: Insufficient documentation

## 2014-07-07 DIAGNOSIS — Z792 Long term (current) use of antibiotics: Secondary | ICD-10-CM | POA: Insufficient documentation

## 2014-07-07 DIAGNOSIS — G8929 Other chronic pain: Secondary | ICD-10-CM | POA: Insufficient documentation

## 2014-07-07 DIAGNOSIS — Z87448 Personal history of other diseases of urinary system: Secondary | ICD-10-CM | POA: Insufficient documentation

## 2014-07-07 DIAGNOSIS — Z79899 Other long term (current) drug therapy: Secondary | ICD-10-CM | POA: Insufficient documentation

## 2014-07-07 DIAGNOSIS — M25521 Pain in right elbow: Secondary | ICD-10-CM | POA: Insufficient documentation

## 2014-07-07 DIAGNOSIS — Z87891 Personal history of nicotine dependence: Secondary | ICD-10-CM | POA: Insufficient documentation

## 2014-07-07 DIAGNOSIS — Z8719 Personal history of other diseases of the digestive system: Secondary | ICD-10-CM | POA: Insufficient documentation

## 2014-07-07 DIAGNOSIS — R200 Anesthesia of skin: Secondary | ICD-10-CM | POA: Insufficient documentation

## 2014-07-07 MED ORDER — IBUPROFEN 600 MG PO TABS: 600.0000 mg | ORAL_TABLET | Freq: Three times a day (TID) | ORAL | Status: DC | PRN

## 2014-07-07 NOTE — ED Notes (Signed)
Injury to rt elbow 1 week ago due to fall, Here for recheck.
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
## 2014-07-07 NOTE — Discharge Instructions (Signed)

## 2014-08-23 ENCOUNTER — Emergency Department (HOSPITAL_BASED_OUTPATIENT_CLINIC_OR_DEPARTMENT_OTHER): Payer: Self-pay

## 2014-08-23 ENCOUNTER — Encounter (HOSPITAL_BASED_OUTPATIENT_CLINIC_OR_DEPARTMENT_OTHER): Payer: Self-pay | Admitting: Emergency Medicine

## 2014-08-23 DIAGNOSIS — Z79899 Other long term (current) drug therapy: Secondary | ICD-10-CM | POA: Insufficient documentation

## 2014-08-23 DIAGNOSIS — Z792 Long term (current) use of antibiotics: Secondary | ICD-10-CM | POA: Insufficient documentation

## 2014-08-23 DIAGNOSIS — Z8719 Personal history of other diseases of the digestive system: Secondary | ICD-10-CM | POA: Insufficient documentation

## 2014-08-23 DIAGNOSIS — Y998 Other external cause status: Secondary | ICD-10-CM | POA: Insufficient documentation

## 2014-08-23 DIAGNOSIS — W010XXA Fall on same level from slipping, tripping and stumbling without subsequent striking against object, initial encounter: Secondary | ICD-10-CM | POA: Insufficient documentation

## 2014-08-23 DIAGNOSIS — S8001XA Contusion of right knee, initial encounter: Secondary | ICD-10-CM | POA: Insufficient documentation

## 2014-08-23 DIAGNOSIS — Y9389 Activity, other specified: Secondary | ICD-10-CM | POA: Insufficient documentation

## 2014-08-23 DIAGNOSIS — Y9289 Other specified places as the place of occurrence of the external cause: Secondary | ICD-10-CM | POA: Insufficient documentation

## 2014-08-23 DIAGNOSIS — Z87438 Personal history of other diseases of male genital organs: Secondary | ICD-10-CM | POA: Insufficient documentation

## 2014-08-23 DIAGNOSIS — Z880 Allergy status to penicillin: Secondary | ICD-10-CM | POA: Insufficient documentation

## 2014-08-23 DIAGNOSIS — Z87891 Personal history of nicotine dependence: Secondary | ICD-10-CM | POA: Insufficient documentation

## 2014-08-23 NOTE — ED Notes (Addendum)
Pt reports fall injury to right knee

## 2014-08-23 NOTE — ED Provider Notes (Signed)
CSN: [id number]     Arrival date & time 08/23/14  2142 History  This chart was scribed for Cyncere Sontag, MD by Tonye RoyaltyJoshua Chen, ED Scribe. This patient was seen in room MH02/MH02 and the patient's care was started at 12:15 AM.    Chief Complaint  Patient presents with  . Knee Injury   Patient is a 39 y.o. male presenting with knee pain. The history is provided by the patient. No language interpreter was used.  Knee Pain Location:  Knee Injury: yes   Mechanism of injury: fall   Fall:    Fall occurred:  Standing   Impact surface:  Hard floor   Point of impact:  Knees   Entrapped after fall: no   Knee location:  R knee Pain details:    Radiates to:  Does not radiate   Severity:  Mild   Onset quality:  Sudden   Timing:  Constant   Progression:  Unchanged Chronicity:  New Dislocation: no   Foreign body present:  No foreign bodies Prior injury to area:  No Relieved by:  Nothing Worsened by:  Nothing tried Ineffective treatments:  None tried Associated symptoms: no back pain, no stiffness and no swelling   Risk factors: no frequent fractures     HPI Comments: Terrance Hodges is a 39 y.o. male who presents to the Emergency Department complaining of right knee after falling and landing on his knee PTA. He states he has not used anything for his pain.   Past Medical History  Diagnosis Date  . Prostatitis   . Reflux   . History of hematuria    Past Surgical History  Procedure Laterality Date  . Tonsillectomy    . Adenoidectomy    . Knee surgery    . Wisdom tooth extraction    . Ureter surgery     History reviewed. No pertinent family history. History  Substance Use Topics  . Smoking status: Former Smoker -- 0.50 packs/day for 15 years    Types: Cigarettes  . Smokeless tobacco: Not on file  . Alcohol Use: No    Review of Systems  Musculoskeletal: Negative for back pain and stiffness.       Right knee pain  Neurological: Negative for numbness.  All other systems reviewed  and are negative.     Allergies  Clindamycin/lincomycin; Penicillins; and Pineapple  Home Medications   Prior to Admission medications   Medication Sig Start Date End Date Taking? Authorizing Provider  cephALEXin (KEFLEX) 500 MG capsule 2 caps po bid x 7 days 01/05/14   Mercedes Camprubi-Soms, PA-C  diphenoxylate-atropine (LOMOTIL) 2.5-0.025 MG per tablet Take 1 tablet by mouth 4 (four) times daily as needed for diarrhea or loose stools. 05/29/14   Burgess AmorJulie Idol, PA-C  HYDROcodone-acetaminophen (NORCO/VICODIN) 5-325 MG per tablet Take 2 tablets by mouth every 4 (four) hours as needed for moderate pain or severe pain. 05/08/14   Roxy Horsemanobert Browning, PA-C  ibuprofen (ADVIL,MOTRIN) 600 MG tablet Take 1 tablet (600 mg total) by mouth every 8 (eight) hours as needed. 07/07/14   Burgess AmorJulie Idol, PA-C  levofloxacin (LEVAQUIN) 500 MG tablet Take 1 tablet (500 mg total) by mouth daily. 05/08/14   Roxy Horsemanobert Browning, PA-C  naproxen (NAPROSYN) 500 MG tablet Take 1 tablet (500 mg total) by mouth 2 (two) times daily with a meal. 06/30/14   Tammi Triplett, PA-C  oxyCODONE-acetaminophen (PERCOCET/ROXICET) 5-325 MG per tablet Take 1 tablet by mouth every 4 (four) hours as needed. 06/30/14   Tammi  Triplett, PA-C  promethazine (PHENERGAN) 25 MG tablet Take 1 tablet (25 mg total) by mouth every 6 (six) hours as needed for nausea or vomiting. 05/29/14   Burgess Amor, PA-C  sulfamethoxazole-trimethoprim (SEPTRA DS) 800-160 MG per tablet Take 1 tablet by mouth every 12 (twelve) hours. 05/13/14   Burgess Amor, PA-C   BP 124/76 mmHg  Pulse 96  Temp(Src) 99.5 F (37.5 C) (Oral)  Resp 20  Ht  (1.778 m)  Wt 215 lb (97.523 kg)  BMI 30.85 kg/m2  SpO2 100% Physical Exam  Constitutional: He is oriented to person, place, and time. He appears well-developed and well-nourished. No distress.  HENT:  Head: Normocephalic and atraumatic.  Mouth/Throat: Oropharynx is clear and moist. No oropharyngeal exudate.  Eyes: Conjunctivae and EOM are  normal. Pupils are equal, round, and reactive to light.  Neck: Normal range of motion. Neck supple.  Cardiovascular: Normal rate, regular rhythm and normal heart sounds.   No murmur heard. Pulmonary/Chest: Effort normal and breath sounds normal. No respiratory distress. He has no wheezes. He has no rales.  Abdominal: Soft. Bowel sounds are normal. There is no tenderness. There is no rebound and no guarding.  Musculoskeletal: Normal range of motion. He exhibits no edema.       Right knee: He exhibits normal range of motion, no swelling, no effusion, no ecchymosis, no deformity, no laceration, no erythema, normal alignment, no LCL laxity, normal patellar mobility, no bony tenderness, normal meniscus and no MCL laxity. No tenderness found. No medial joint line, no lateral joint line, no MCL, no LCL and no patellar tendon tenderness noted.  No laxity to varus or valgus  Negative posterior and anterior drawer sign No patellar alta or [place] No effusion Posterior tibial pulse is 2+ No swelling of the calf Strong popliteal pulse tendons are intact Gait intact  Neurological: He is alert and oriented to person, place, and time. He has normal reflexes.  Skin: Skin is warm and dry.  Psychiatric: He has a normal mood and affect.  Nursing note and vitals reviewed.   ED Course  Procedures (including critical care time)  DIAGNOSTIC STUDIES: Oxygen Saturation is 100% on room air, normal by my interpretation.    COORDINATION OF CARE: 12:17 AM Discussed treatment plan with patient at beside, the patient agrees with the plan and has no further questions at this time.   Labs Review Labs Reviewed - No data to display  Imaging Review Dg Knee Complete 4 Views Right  08/23/2014   CLINICAL DATA:  Right knee pain status post fall. Unable to bear weight.  EXAM: RIGHT KNEE - COMPLETE 4+ VIEW  COMPARISON:  None.  FINDINGS: No displaced fracture or dislocation. No aggressive osseous lesion or overt  degenerative change. No significant joint effusion.  IMPRESSION: No acute or aggressive osseous finding of the right knee.  Consider MRI if clinical concern for internal derangement or other radiographically occult pathology persists.   Electronically Signed   By: Jearld Lesch M.D.   On: 08/23/2014 22:52     EKG Interpretation None      MDM  Will treat with ice, elevation, and NSAID. Review shows he is already prescribed narcotics.  Final diagnoses:  None    Ice elevation and NSAIDS and close follow up I personally performed the services described in this documentation, which was scribed in my presence. The recorded information has been reviewed and is accurate.     Cy Blamer, MD 08/24/14 [phone]

## 2014-08-24 ENCOUNTER — Emergency Department (HOSPITAL_BASED_OUTPATIENT_CLINIC_OR_DEPARTMENT_OTHER)
Admission: EM | Admit: 2014-08-24 | Discharge: 2014-08-24 | Disposition: A | Discharge status: Home / Self Care | Payer: Self-pay | Attending: Emergency Medicine | Admitting: Emergency Medicine

## 2014-08-24 ENCOUNTER — Encounter (HOSPITAL_BASED_OUTPATIENT_CLINIC_OR_DEPARTMENT_OTHER): Payer: Self-pay | Admitting: Emergency Medicine

## 2014-08-24 DIAGNOSIS — S8001XA Contusion of right knee, initial encounter: Secondary | ICD-10-CM

## 2014-08-24 MED ORDER — NAPROXEN 250 MG PO TABS
500.0000 mg | ORAL_TABLET | Freq: Once | ORAL | Status: AC
  Administered 2014-08-24: 500 mg via ORAL
  Filled 2014-08-24: qty 2

## 2014-08-24 MED ORDER — NAPROXEN 375 MG PO TABS: 375.0000 mg | ORAL_TABLET | Freq: Two times a day (BID) | ORAL | Status: DC

## 2014-08-24 NOTE — Discharge Instructions (Signed)
Contusion °A contusion is a deep bruise. Contusions happen when an injury causes bleeding under the skin. Signs of bruising include pain, puffiness (swelling), and discolored skin. The contusion may turn blue, purple, or yellow. °HOME CARE  °· Put ice on the injured area. °¨ Put ice in a plastic bag. °¨ Place a towel between your skin and the bag. °¨ Leave the ice on for 15-20 minutes, 03-04 times a day. °· Only take medicine as told by your doctor. °· Rest the injured area. °· If possible, raise (elevate) the injured area to lessen puffiness. °GET HELP RIGHT AWAY IF:  °· You have more bruising or puffiness. °· You have pain that is getting worse. °· Your puffiness or pain is not helped by medicine. °MAKE SURE YOU:  °· Understand these instructions. °· Will watch your condition. °· Will get help right away if you are not doing well or get worse. °Document Released: 11/09/2007 Document Revised: 08/15/2011 Document Reviewed: 03/28/2011 °ExitCare® Patient Information ©2015 [employer]. This information is not intended to replace advice given to you by your health care provider. Make sure you discuss any questions you have with your health care provider. ° °Cryotherapy °Cryotherapy means treatment with cold. Ice or gel packs can be used to reduce both pain and swelling. Ice is the most helpful within the first 24 to 48 hours after an injury or flare-up from overusing a muscle or joint. Sprains, strains, spasms, burning pain, shooting pain, and aches can all be eased with ice. Ice can also be used when recovering from surgery. Ice is effective, has very few side effects, and is safe for most people to use. °PRECAUTIONS  °Ice is not a safe treatment option for people with: °· Raynaud phenomenon. This is a condition affecting small blood vessels in the extremities. Exposure to cold may cause your problems to return. °· Cold hypersensitivity. There are many forms of cold hypersensitivity, including: °¨ Cold urticaria.  Red, itchy hives appear on the skin when the tissues begin to warm after being iced. °¨ Cold erythema. This is a red, itchy rash caused by exposure to cold. °¨ Cold hemoglobinuria. Red blood cells break down when the tissues begin to warm after being iced. The hemoglobin that carry oxygen are passed into the urine because they cannot combine with blood proteins fast enough. °· Numbness or altered sensitivity in the area being iced. °If you have any of the following conditions, do not use ice until you have discussed cryotherapy with your caregiver: °· Heart conditions, such as arrhythmia, angina, or chronic heart disease. °· High blood pressure. °· Healing wounds or open skin in the area being iced. °· Current infections. °· Rheumatoid arthritis. °· Poor circulation. °· Diabetes. °Ice slows the blood flow in the region it is applied. This is beneficial when trying to stop inflamed tissues from spreading irritating chemicals to surrounding tissues. However, if you expose your skin to cold temperatures for too long or without the proper protection, you can damage your skin or nerves. Watch for signs of skin damage due to cold. °HOME CARE INSTRUCTIONS °Follow these tips to use ice and cold packs safely. °· Place a dry or damp towel between the ice and skin. A damp towel will cool the skin more quickly, so you may need to shorten the time that the ice is used. °· For a more rapid response, add gentle compression to the ice. °· Ice for no more than 10 to 20 minutes at a time.   The bonier the area you are icing, the less time it will take to get the benefits of ice. °· Check your skin after 5 minutes to make sure there are no signs of a poor response to cold or skin damage. °· Rest 20 minutes or more between uses. °· Once your skin is numb, you can end your treatment. You can test numbness by very lightly touching your skin. The touch should be so light that you do not see the skin dimple from the pressure of your  fingertip. When using ice, most people will feel these normal sensations in this order: cold, burning, aching, and numbness. °· Do not use ice on someone who cannot communicate their responses to pain, such as small children or people with dementia. °HOW TO MAKE AN ICE PACK °Ice packs are the most common way to use ice therapy. Other methods include ice massage, ice baths, and cryosprays. Muscle creams that cause a cold, tingly feeling do not offer the same benefits that ice offers and should not be used as a substitute unless recommended by your caregiver. °To make an ice pack, do one of the following: °· Place crushed ice or a bag of frozen vegetables in a sealable plastic bag. Squeeze out the excess air. Place this bag inside another plastic bag. Slide the bag into a pillowcase or place a damp towel between your skin and the bag. °· Mix 3 parts water with 1 part rubbing alcohol. Freeze the mixture in a sealable plastic bag. When you remove the mixture from the freezer, it will be slushy. Squeeze out the excess air. Place this bag inside another plastic bag. Slide the bag into a pillowcase or place a damp towel between your skin and the bag. °SEEK MEDICAL CARE IF: °· You develop white spots on your skin. This may give the skin a blotchy (mottled) appearance. °· Your skin turns blue or pale. °· Your skin becomes waxy or hard. °· Your swelling gets worse. °MAKE SURE YOU:  °· Understand these instructions. °· Will watch your condition. °· Will get help right away if you are not doing well or get worse. °Document Released: 01/17/2011 Document Revised: 10/07/2013 Document Reviewed: 01/17/2011 °ExitCare® Patient Information ©2015 [employer]. This information is not intended to replace advice given to you by your health care provider. Make sure you discuss any questions you have with your health care provider. ° °

## 2014-09-23 ENCOUNTER — Emergency Department (HOSPITAL_BASED_OUTPATIENT_CLINIC_OR_DEPARTMENT_OTHER)
Admission: EM | Admit: 2014-09-23 | Discharge: 2014-09-24 | Disposition: A | Discharge status: Home / Self Care | Payer: Self-pay | Attending: Emergency Medicine | Admitting: Emergency Medicine

## 2014-09-23 ENCOUNTER — Encounter (HOSPITAL_BASED_OUTPATIENT_CLINIC_OR_DEPARTMENT_OTHER): Payer: Self-pay

## 2014-09-23 ENCOUNTER — Emergency Department (HOSPITAL_BASED_OUTPATIENT_CLINIC_OR_DEPARTMENT_OTHER): Payer: Self-pay

## 2014-09-23 DIAGNOSIS — Z87448 Personal history of other diseases of urinary system: Secondary | ICD-10-CM | POA: Insufficient documentation

## 2014-09-23 DIAGNOSIS — R509 Fever, unspecified: Secondary | ICD-10-CM | POA: Insufficient documentation

## 2014-09-23 DIAGNOSIS — Z8719 Personal history of other diseases of the digestive system: Secondary | ICD-10-CM | POA: Insufficient documentation

## 2014-09-23 DIAGNOSIS — Z792 Long term (current) use of antibiotics: Secondary | ICD-10-CM | POA: Insufficient documentation

## 2014-09-23 DIAGNOSIS — Z880 Allergy status to penicillin: Secondary | ICD-10-CM | POA: Insufficient documentation

## 2014-09-23 DIAGNOSIS — Z87891 Personal history of nicotine dependence: Secondary | ICD-10-CM | POA: Insufficient documentation

## 2014-09-23 DIAGNOSIS — Z79899 Other long term (current) drug therapy: Secondary | ICD-10-CM | POA: Insufficient documentation

## 2014-09-23 DIAGNOSIS — Z791 Long term (current) use of non-steroidal anti-inflammatories (NSAID): Secondary | ICD-10-CM | POA: Insufficient documentation

## 2014-09-23 DIAGNOSIS — R300 Dysuria: Secondary | ICD-10-CM | POA: Insufficient documentation

## 2014-09-23 LAB — CBC WITH DIFFERENTIAL/PLATELET
BASOS ABS: 0 10*3/uL (ref 0.0–0.1)
BASOS PCT: 0 % (ref 0–1)
Eosinophils Absolute: 0.1 10*3/uL (ref 0.0–0.7)
Eosinophils Relative: 1 % (ref 0–5)
HEMATOCRIT: 46.2 % (ref 39.0–52.0)
Hemoglobin: 16.1 g/dL (ref 13.0–17.0)
Lymphocytes Relative: 22 % (ref 12–46)
Lymphs Abs: 2.3 10*3/uL (ref 0.7–4.0)
MCH: 32.9 pg (ref 26.0–34.0)
MCHC: 34.8 g/dL (ref 30.0–36.0)
MCV: 94.5 fL (ref 78.0–100.0)
Monocytes Absolute: 1 10*3/uL (ref 0.1–1.0)
Monocytes Relative: 9 % (ref 3–12)
NEUTROS ABS: 7.3 10*3/uL (ref 1.7–7.7)
NEUTROS PCT: 68 % (ref 43–77)
PLATELETS: 247 10*3/uL (ref 150–400)
RBC: 4.89 MIL/uL (ref 4.22–5.81)
RDW: 12.3 % (ref 11.5–15.5)
WBC: 10.6 10*3/uL — ABNORMAL HIGH (ref 4.0–10.5)

## 2014-09-23 LAB — URINALYSIS, ROUTINE W REFLEX MICROSCOPIC
Glucose, UA: 100 mg/dL — AB
Ketones, ur: NEGATIVE mg/dL
Nitrite: NEGATIVE
Protein, ur: NEGATIVE mg/dL
Specific Gravity, Urine: 1.03 (ref 1.005–1.030)
Urobilinogen, UA: 0.2 mg/dL (ref 0.0–1.0)
pH: 5.5 (ref 5.0–8.0)

## 2014-09-23 LAB — BASIC METABOLIC PANEL
ANION GAP: 9 (ref 5–15)
BUN: 12 mg/dL (ref 6–23)
CHLORIDE: 106 mmol/L (ref 96–112)
CO2: 24 mmol/L (ref 19–32)
Calcium: 9 mg/dL (ref 8.4–10.5)
Creatinine, Ser: 1.55 mg/dL — ABNORMAL HIGH (ref 0.50–1.35)
GFR calc non Af Amer: 56 mL/min — ABNORMAL LOW (ref >90)
GFR, EST AFRICAN AMERICAN: 65 mL/min — AB (ref >90)
Glucose, Bld: 179 mg/dL — ABNORMAL HIGH (ref 70–99)
POTASSIUM: 3.8 mmol/L (ref 3.5–5.1)
SODIUM: 139 mmol/L (ref 135–145)

## 2014-09-23 LAB — URINE MICROSCOPIC-ADD ON

## 2014-09-23 MED ORDER — MORPHINE SULFATE 4 MG/ML IJ SOLN
4.0000 mg | Freq: Once | INTRAMUSCULAR | Status: AC
  Administered 2014-09-23: 4 mg via INTRAVENOUS
  Filled 2014-09-23: qty 1

## 2014-09-23 MED ORDER — ONDANSETRON HCL 4 MG/2ML IJ SOLN
4.0000 mg | Freq: Once | INTRAMUSCULAR | Status: AC
  Administered 2014-09-23: 4 mg via INTRAVENOUS
  Filled 2014-09-23: qty 2

## 2014-09-23 NOTE — ED Notes (Signed)
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  Reports bilateral back pain, groin pain and dysuria x 1 day.

## 2014-09-23 NOTE — ED Notes (Signed)
Bilateral flank pain with difficulty urinating x 1 day. Reports pelvic pain. Denies n/v

## 2014-09-23 NOTE — ED Provider Notes (Signed)
CSN: [id number]     Arrival date & time 09/23/14  [id number] History  This chart was scribed for Terrance BuccoMelanie Juniper Cobey, MD by Modena JanskyAlbert Thayil, ED Scribe. This patient was seen in room MH02/MH02 and the patient's care was started at 10:22 PM.   Chief Complaint  Patient presents with  . Flank Pain   The history is provided by the patient. No language interpreter was used.   HPI Comments: Terrance Hodges is a 39 y.o. male who presents to the Emergency Department complaining of constant moderate bilateral flank pain that started yesterday. He reports that he has been having flank pain since yesterday with difficulty urinating. He states that he also has mild nausea and low-grade subjective fever. Pt's temperature in the ED is 98.2. 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 He denies any hx of kidney stones. He also denies any vomiting, hematuria, or pain with BM. He does [facility] and difficulty getting his urine out. He denies any penile discharge. He has a history of recent prostatitis.  Past Medical History  Diagnosis Date  . Prostatitis   . Reflux   . History of hematuria    Past Surgical History  Procedure Laterality Date  . Tonsillectomy    . Adenoidectomy    . Knee surgery    . Wisdom tooth extraction    . Ureter surgery     No family history on file. History  Substance Use Topics  . Smoking status: Former Smoker -- 0.50 packs/day for 15 years    Types: Cigarettes  . Smokeless tobacco: Not on file  . Alcohol Use: No    Review of Systems  Constitutional: Positive for fever. Negative for chills, diaphoresis and fatigue.  HENT: Negative for congestion, rhinorrhea and sneezing.   Eyes: Negative.   Respiratory: Negative for cough, chest tightness and shortness of breath.   Cardiovascular: Negative for chest pain and leg swelling.  Gastrointestinal: Positive for nausea. Negative for vomiting, abdominal pain, diarrhea and blood in stool.  Genitourinary:  Positive for flank pain and difficulty urinating. Negative for dysuria, frequency and hematuria.  Musculoskeletal: Negative for back pain and arthralgias.  Skin: Negative for rash.  Neurological: Negative for dizziness, speech difficulty, weakness, numbness and headaches.    Allergies  Clindamycin/lincomycin; Penicillins; and Pineapple  Home Medications   Prior to Admission medications   Medication Sig Start Date End Date Taking? Authorizing Provider  cephALEXin (KEFLEX) 500 MG capsule 2 caps po bid x 7 days 01/05/14   Mercedes Camprubi-Soms, PA-C  ciprofloxacin (CIPRO) 500 MG tablet Take 1 tablet (500 mg total) by mouth 2 (two) times daily. One po bid x 7 days 09/24/14   Terrance BuccoMelanie Blaiden Werth, MD  diphenoxylate-atropine (LOMOTIL) 2.5-0.025 MG per tablet Take 1 tablet by mouth 4 (four) times daily as needed for diarrhea or loose stools. 05/29/14   Burgess AmorJulie Idol, PA-C  HYDROcodone-acetaminophen (NORCO/VICODIN) 5-325 MG per tablet Take 1-2 tablets by mouth every 4 (four) hours as needed. 09/24/14   Terrance BuccoMelanie Liane Tribbey, MD  ibuprofen (ADVIL,MOTRIN) 600 MG tablet Take 1 tablet (600 mg total) by mouth every 8 (eight) hours as needed. 07/07/14   Burgess AmorJulie Idol, PA-C  levofloxacin (LEVAQUIN) 500 MG tablet Take 1 tablet (500 mg total) by mouth daily. 05/08/14   Roxy Horsemanobert Browning, PA-C  naproxen (NAPROSYN) 375 MG tablet Take 1 tablet (375 mg total) by mouth 2 (two) times daily. 08/24/14   April Palumbo, MD  naproxen (NAPROSYN) 500 MG tablet Take 1  tablet (500 mg total) by mouth 2 (two) times daily with a meal. 06/30/14   Tammy Triplett, PA-C  promethazine (PHENERGAN) 25 MG tablet Take 1 tablet (25 mg total) by mouth every 6 (six) hours as needed for nausea or vomiting. 05/29/14   Burgess Amor, PA-C  sulfamethoxazole-trimethoprim (SEPTRA DS) 800-160 MG per tablet Take 1 tablet by mouth every 12 (twelve) hours. 05/13/14   Burgess Amor, PA-C   BP 125/80 mmHg  Pulse 100  Temp(Src) 98.2 F (36.8 C) (Oral)  Resp 18  Ht  (1.778 m)   Wt 215 lb (97.523 kg)  BMI 30.85 kg/m2  SpO2 99% Physical Exam  Constitutional: He is oriented to person, place, and time. He appears well-developed and well-nourished.  HENT:  Head: Normocephalic and atraumatic.  Eyes: Pupils are equal, round, and reactive to light.  Neck: Normal range of motion. Neck supple.  Cardiovascular: Normal rate, regular rhythm and normal heart sounds.   Pulmonary/Chest: Effort normal and breath sounds normal. No respiratory distress. He has no wheezes. He has no rales. He exhibits no tenderness.  Abdominal: Soft. Bowel sounds are normal. There is tenderness. There is no rebound and no guarding.  Mild TTP in the SP area. Positive bilateral CVA tenderness.   Genitourinary:  No pain in inguinal canals or testicles  Musculoskeletal: Normal range of motion. He exhibits no edema.  Lymphadenopathy:    He has no cervical adenopathy.  Neurological: He is alert and oriented to person, place, and time.  Skin: Skin is warm and dry. No rash noted.  Psychiatric: He has a normal mood and affect.  Nursing note and vitals reviewed.   ED Course  Procedures (including critical care time) DIAGNOSTIC STUDIES: Oxygen Saturation is 99% on RA, normal by my interpretation.    COORDINATION OF CARE: 10:26 PM- Pt advised of plan for treatment which includes medication, radiology, and labs and pt agrees.  Labs Review Labs Reviewed  URINALYSIS, ROUTINE W REFLEX MICROSCOPIC - Abnormal; Notable for the following:    Color, Urine AMBER (*)    APPearance CLOUDY (*)    Glucose, UA 100 (*)    Hgb urine dipstick MODERATE (*)    Bilirubin Urine SMALL (*)    Leukocytes, UA SMALL (*)    All other components within normal limits  URINE MICROSCOPIC-ADD ON - Abnormal; Notable for the following:    Squamous Epithelial / LPF FEW (*)    All other components within normal limits  BASIC METABOLIC PANEL - Abnormal; Notable for the following:    Glucose, Bld 179 (*)    Creatinine, Ser 1.55  (*)    GFR calc non Af Amer 56 (*)    GFR calc Af Amer 65 (*)    All other components within normal limits  CBC WITH DIFFERENTIAL/PLATELET - Abnormal; Notable for the following:    WBC 10.6 (*)    All other components within normal limits  URINE CULTURE    Imaging Review Ct Renal Stone Study  09/23/2014   CLINICAL DATA:  Bilateral flank pain with fever, nausea, and hematuria  EXAM: CT ABDOMEN AND PELVIS WITHOUT CONTRAST  TECHNIQUE: Multidetector CT imaging of the abdomen and pelvis was performed following the standard protocol without IV contrast.  COMPARISON:  05/29/2014  FINDINGS: BODY WALL: No contributory findings.  LOWER CHEST: No contributory findings.  ABDOMEN/PELVIS:  Liver: No focal abnormality.  Biliary: No evidence of biliary obstruction or stone.  Pancreas: Unremarkable.  Spleen: Unremarkable.  Adrenals: Unremarkable.  Kidneys and  ureters: No hydronephrosis or stone.  Bladder: Unremarkable.  Reproductive: No pathologic findings.  Bowel: No obstruction. Normal appendix.  Retroperitoneum: No mass or adenopathy.  Peritoneum: No ascites or pneumoperitoneum.  Vascular: No acute abnormality.  OSSEOUS: No acute abnormalities. Focal facet arthritis on the right at L5-S1.  IMPRESSION: Negative.  No explanation for flank pain.   Electronically Signed   By: Marnee Spring M.D.   On: 09/23/2014 23:17     EKG Interpretation None      MDM   Final diagnoses:  Dysuria    Patient presents with dysuria and flank pain. His CT scan shows no evidence of hydronephrosis or any stones. He does have some hematuria but no other suggestions of infection on his urine. However given his symptoms I will go ahead and treat him with Cipro. I encouraged him to have close follow-up with his urologist in [facility]. I did advise him that his creatinine is mildly elevated at 1.55 and he needs to have this closely rechecked as well.  I personally performed the services described in this documentation, which was  scribed in my presence.  The recorded information has been reviewed and considered.     Terrance Bucco, MD 09/24/14 [phone]

## 2014-09-24 MED ORDER — CIPROFLOXACIN HCL 500 MG PO TABS: 500.0000 mg | ORAL_TABLET | Freq: Two times a day (BID) | ORAL | Status: DC

## 2014-09-24 MED ORDER — HYDROCODONE-ACETAMINOPHEN 5-325 MG PO TABS: 1.0000 | ORAL_TABLET | ORAL | Status: DC | PRN

## 2014-09-24 NOTE — Discharge Instructions (Signed)

## 2014-09-26 LAB — URINE CULTURE
COLONY COUNT: NO GROWTH
CULTURE: NO GROWTH
Special Requests: NORMAL

## 2015-02-03 ENCOUNTER — Encounter (HOSPITAL_BASED_OUTPATIENT_CLINIC_OR_DEPARTMENT_OTHER): Payer: Self-pay | Admitting: *Deleted

## 2015-02-03 ENCOUNTER — Emergency Department (HOSPITAL_BASED_OUTPATIENT_CLINIC_OR_DEPARTMENT_OTHER)
Admission: EM | Admit: 2015-02-03 | Discharge: 2015-02-03 | Disposition: A | Discharge status: Home / Self Care | Payer: Self-pay | Attending: Emergency Medicine | Admitting: Emergency Medicine

## 2015-02-03 ENCOUNTER — Emergency Department (HOSPITAL_BASED_OUTPATIENT_CLINIC_OR_DEPARTMENT_OTHER): Payer: Self-pay

## 2015-02-03 DIAGNOSIS — Y92008 Other place in unspecified non-institutional (private) residence as the place of occurrence of the external cause: Secondary | ICD-10-CM | POA: Insufficient documentation

## 2015-02-03 DIAGNOSIS — Z880 Allergy status to penicillin: Secondary | ICD-10-CM | POA: Insufficient documentation

## 2015-02-03 DIAGNOSIS — R61 Generalized hyperhidrosis: Secondary | ICD-10-CM | POA: Insufficient documentation

## 2015-02-03 DIAGNOSIS — W108XXA Fall (on) (from) other stairs and steps, initial encounter: Secondary | ICD-10-CM | POA: Insufficient documentation

## 2015-02-03 DIAGNOSIS — S3991XA Unspecified injury of abdomen, initial encounter: Secondary | ICD-10-CM | POA: Insufficient documentation

## 2015-02-03 DIAGNOSIS — Z792 Long term (current) use of antibiotics: Secondary | ICD-10-CM | POA: Insufficient documentation

## 2015-02-03 DIAGNOSIS — S300XXA Contusion of lower back and pelvis, initial encounter: Secondary | ICD-10-CM | POA: Insufficient documentation

## 2015-02-03 DIAGNOSIS — Z8719 Personal history of other diseases of the digestive system: Secondary | ICD-10-CM | POA: Insufficient documentation

## 2015-02-03 DIAGNOSIS — Z87891 Personal history of nicotine dependence: Secondary | ICD-10-CM | POA: Insufficient documentation

## 2015-02-03 DIAGNOSIS — Y9389 Activity, other specified: Secondary | ICD-10-CM | POA: Insufficient documentation

## 2015-02-03 DIAGNOSIS — R000 Tachycardia, unspecified: Secondary | ICD-10-CM | POA: Insufficient documentation

## 2015-02-03 DIAGNOSIS — Z87448 Personal history of other diseases of urinary system: Secondary | ICD-10-CM | POA: Insufficient documentation

## 2015-02-03 DIAGNOSIS — Y998 Other external cause status: Secondary | ICD-10-CM | POA: Insufficient documentation

## 2015-02-03 DIAGNOSIS — T148XXA Other injury of unspecified body region, initial encounter: Secondary | ICD-10-CM

## 2015-02-03 DIAGNOSIS — Z791 Long term (current) use of non-steroidal anti-inflammatories (NSAID): Secondary | ICD-10-CM | POA: Insufficient documentation

## 2015-02-03 MED ORDER — NAPROXEN 500 MG PO TABS: 500.0000 mg | ORAL_TABLET | Freq: Two times a day (BID) | ORAL | Status: DC

## 2015-02-03 MED ORDER — CYCLOBENZAPRINE HCL 10 MG PO TABS
10.0000 mg | ORAL_TABLET | Freq: Once | ORAL | Status: AC
  Administered 2015-02-03: 10 mg via ORAL
  Filled 2015-02-03: qty 1

## 2015-02-03 MED ORDER — CYCLOBENZAPRINE HCL 10 MG PO TABS: 10.0000 mg | ORAL_TABLET | Freq: Two times a day (BID) | ORAL | Status: DC | PRN

## 2015-02-03 MED ORDER — KETOROLAC TROMETHAMINE 60 MG/2ML IM SOLN
60.0000 mg | Freq: Once | INTRAMUSCULAR | Status: AC
  Administered 2015-02-03: 60 mg via INTRAMUSCULAR
  Filled 2015-02-03: qty 2

## 2015-02-03 NOTE — ED Notes (Signed)
Pt. Is able to walk with no difficulty and no difficulty urinating.

## 2015-02-03 NOTE — Discharge Instructions (Signed)
Contusion °A contusion is a deep bruise. Contusions happen when an injury causes bleeding under the skin. Signs of bruising include pain, puffiness (swelling), and discolored skin. The contusion may turn blue, purple, or yellow. °HOME CARE  °· Put ice on the injured area. °¨ Put ice in a plastic bag. °¨ Place a towel between your skin and the bag. °¨ Leave the ice on for 15-20 minutes, 03-04 times a day. °· Only take medicine as told by your doctor. °· Rest the injured area. °· If possible, raise (elevate) the injured area to lessen puffiness. °GET HELP RIGHT AWAY IF:  °· You have more bruising or puffiness. °· You have pain that is getting worse. °· Your puffiness or pain is not helped by medicine. °MAKE SURE YOU:  °· Understand these instructions. °· Will watch your condition. °· Will get help right away if you are not doing well or get worse. °Document Released: 11/09/2007 Document Revised: 08/15/2011 Document Reviewed: 03/28/2011 °[employer]® Patient Information ©2015 [employer]. This information is not intended to replace advice given to you by your health care provider. Make sure you discuss any questions you have with your health care provider. ° °

## 2015-02-03 NOTE — ED Provider Notes (Addendum)
CSN: [id number]     Arrival date & time 02/03/15  1428 History   First MD Initiated Contact with Patient 02/03/15 1506     Chief Complaint  Patient presents with  . Back Injury     (Consider location/radiation/quality/duration/timing/severity/associated sxs/prior Treatment) Patient is a 39 y.o. male presenting with fall. The history is provided by the patient.  Fall This is a new (walking down the steps on the porch on sunday and the step broke and he landed on tailbone and fell down 2 more steps) problem. The current episode started 2 days ago. The problem occurs constantly. The problem has been gradually worsening. Associated symptoms comments: Right sided back pain, pelvic pain and tailbone pain.  No head injury or [name].  No neck pain.  Able to walk but painful.  No incontinence of stool or urine.  No hematuria.. The symptoms are aggravated by walking, bending and twisting. The symptoms are relieved by rest and acetaminophen. He has tried acetaminophen and rest for the symptoms. The treatment provided no relief.    Past Medical History  Diagnosis Date  . Prostatitis   . Reflux   . History of hematuria    Past Surgical History  Procedure Laterality Date  . Tonsillectomy    . Adenoidectomy    . Knee surgery    . Wisdom tooth extraction    . Ureter surgery     No family history on file. Social History  Substance Use Topics  . Smoking status: Former Smoker -- 0.50 packs/day for 15 years    Types: Cigarettes  . Smokeless tobacco: None  . Alcohol Use: No    Review of Systems  All other systems reviewed and are negative.     Allergies  Clindamycin/lincomycin; Penicillins; and Pineapple  Home Medications   Prior to Admission medications   Medication Sig Start Date End Date Taking? Authorizing Provider  cephALEXin (KEFLEX) 500 MG capsule 2 caps po bid x 7 days 01/05/14   Mercedes Camprubi-Soms, PA-C  ciprofloxacin (CIPRO) 500 MG tablet Take 1 tablet (500 mg total) by mouth  2 (two) times daily. One po bid x 7 days 09/24/14   Rolan Bucco, MD  diphenoxylate-atropine (LOMOTIL) 2.5-0.025 MG per tablet Take 1 tablet by mouth 4 (four) times daily as needed for diarrhea or loose stools. 05/29/14   Burgess Amor, PA-C  HYDROcodone-acetaminophen (NORCO/VICODIN) 5-325 MG per tablet Take 1-2 tablets by mouth every 4 (four) hours as needed. 09/24/14   Rolan Bucco, MD  ibuprofen (ADVIL,MOTRIN) 600 MG tablet Take 1 tablet (600 mg total) by mouth every 8 (eight) hours as needed. 07/07/14   Burgess Amor, PA-C  levofloxacin (LEVAQUIN) 500 MG tablet Take 1 tablet (500 mg total) by mouth daily. 05/08/14   Roxy Horseman, PA-C  naproxen (NAPROSYN) 375 MG tablet Take 1 tablet (375 mg total) by mouth 2 (two) times daily. 08/24/14   April Palumbo, MD  naproxen (NAPROSYN) 500 MG tablet Take 1 tablet (500 mg total) by mouth 2 (two) times daily with a meal. 06/30/14   Tammy Triplett, PA-C  promethazine (PHENERGAN) 25 MG tablet Take 1 tablet (25 mg total) by mouth every 6 (six) hours as needed for nausea or vomiting. 05/29/14   Burgess Amor, PA-C  sulfamethoxazole-trimethoprim (SEPTRA DS) 800-160 MG per tablet Take 1 tablet by mouth every 12 (twelve) hours. 05/13/14   Burgess Amor, PA-C   BP 114/78 mmHg  Pulse 122  Temp(Src) 98.5 F (36.9 C) (Oral)  Resp 18  Ht  (  1.778 m)  Wt 210 lb (95.255 kg)  BMI 30.13 kg/m2  SpO2 100% Physical Exam  Constitutional: He is oriented to person, place, and time. He appears well-developed and well-nourished. No distress.  HENT:  Head: Normocephalic and atraumatic.  Mouth/Throat: Oropharynx is clear and moist.  Eyes: Conjunctivae and EOM are normal. Pupils are equal, round, and reactive to light.  Neck: Normal range of motion. Neck supple.  Cardiovascular: Regular rhythm and intact distal pulses.  Tachycardia present.   No murmur heard. Pulmonary/Chest: Effort normal and breath sounds normal. No respiratory distress. He has no wheezes. He has no rales.    Abdominal: Soft. He exhibits no distension. There is tenderness in the right lower quadrant. There is no rebound, no guarding and no CVA tenderness. Hernia confirmed negative in the right inguinal area and confirmed negative in the left inguinal area.    Genitourinary: Testes normal and penis normal. Circumcised.  Musculoskeletal: Normal range of motion. He exhibits no edema.       Lumbar back: He exhibits tenderness, pain and spasm.       Back:  Neurological: He is alert and oriented to person, place, and time.  Skin: Skin is warm. No rash noted. He is diaphoretic. No erythema.  Psychiatric: He has a normal mood and affect. His behavior is normal.  Nursing note and vitals reviewed.   ED Course  Procedures (including critical care time) Labs Review Labs Reviewed - No data to display  Imaging Review Dg Sacrum/coccyx  02/03/2015   CLINICAL DATA:  Pain following fall  EXAM: SACRUM AND COCCYX - 2+ VIEW  COMPARISON:  None.  FINDINGS: Frontal and lateral views obtained. There is no demonstrable fracture or diastases. Joint spaces appear intact. No erosive change.  IMPRESSION: No abnormality noted.   Electronically Signed   By: Bretta Bang III M.D.   On: 02/03/2015 15:28   I have personally reviewed and evaluated these images and lab results as part of my medical decision-making.   EKG Interpretation None      MDM   Final diagnoses:  Contusion of coccyx, initial encounter  Muscle strain    Patient with a past medical history prostatitis and hematuria is coming in today complaining of right-sided back pain and coccyx pain after falling on the stairs. He was walking down the stairs and one of the stairs he was standing on broke causing him to fall directly on his coccyx and then fall down 2 more steps. Since that time which was 2 days ago he's had severe pain in his tailbone as well as some right-sided back and hip pain. He has ongoing issues with his back and always has some  chronic right-sided back pain but is more intense currently after the fall. He denies any numbness or tingling. No bowel incontinence or urinary retention. He denies any frank hematuria but states he has a history of chronic hematuria. His urine color has not changed.  On exam he has paralumbar tenderness but no lumbar tenderness. Also tenderness over the coccyx as well as a noninflamed pilonidal cyst.  Patient is neurovascularly intact.   Initially on exam patient is diaphoretic and slightly tachycardic but when questioned about this patient states that he does not have air conditioning in his car and he was just really hot. Do not feel that it has anything to do with his back pain. He is only taken Tylenol once for the pain. Patient given Toradol and Flexeril. Imaging pending.  3:32 PM Imaging  neg.  Will send home with anti-inflammatory and muscle relaxer to f/u with [facility] if symptoms persist.   Gwyneth Sprout, MD 02/03/15 1533  Gwyneth Sprout, MD 02/03/15 1539

## 2015-02-03 NOTE — ED Notes (Signed)
Step broke and he fell 2 days ago. Pain in his coccyx.

## 2015-04-29 ENCOUNTER — Encounter (HOSPITAL_BASED_OUTPATIENT_CLINIC_OR_DEPARTMENT_OTHER): Payer: Self-pay

## 2015-04-29 ENCOUNTER — Emergency Department (HOSPITAL_BASED_OUTPATIENT_CLINIC_OR_DEPARTMENT_OTHER)
Admission: EM | Admit: 2015-04-29 | Discharge: 2015-04-29 | Disposition: A | Discharge status: Home / Self Care | Payer: Self-pay | Attending: Emergency Medicine | Admitting: Emergency Medicine

## 2015-04-29 DIAGNOSIS — Z880 Allergy status to penicillin: Secondary | ICD-10-CM | POA: Insufficient documentation

## 2015-04-29 DIAGNOSIS — R000 Tachycardia, unspecified: Secondary | ICD-10-CM | POA: Insufficient documentation

## 2015-04-29 DIAGNOSIS — Z79899 Other long term (current) drug therapy: Secondary | ICD-10-CM | POA: Insufficient documentation

## 2015-04-29 DIAGNOSIS — R1030 Lower abdominal pain, unspecified: Secondary | ICD-10-CM | POA: Insufficient documentation

## 2015-04-29 DIAGNOSIS — Z8719 Personal history of other diseases of the digestive system: Secondary | ICD-10-CM | POA: Insufficient documentation

## 2015-04-29 DIAGNOSIS — F17210 Nicotine dependence, cigarettes, uncomplicated: Secondary | ICD-10-CM | POA: Insufficient documentation

## 2015-04-29 DIAGNOSIS — Z791 Long term (current) use of non-steroidal anti-inflammatories (NSAID): Secondary | ICD-10-CM | POA: Insufficient documentation

## 2015-04-29 DIAGNOSIS — Z87448 Personal history of other diseases of urinary system: Secondary | ICD-10-CM | POA: Insufficient documentation

## 2015-04-29 LAB — URINE MICROSCOPIC-ADD ON: WBC UA: NONE SEEN WBC/hpf (ref 0–5)

## 2015-04-29 LAB — BASIC METABOLIC PANEL
Anion gap: 10 (ref 5–15)
BUN: 10 mg/dL (ref 6–20)
CO2: 23 mmol/L (ref 22–32)
Calcium: 9.2 mg/dL (ref 8.9–10.3)
Chloride: 102 mmol/L (ref 101–111)
Creatinine, Ser: 0.98 mg/dL (ref 0.61–1.24)
GFR calc Af Amer: >60 mL/min (ref >60)
GFR calc non Af Amer: >60 mL/min (ref >60)
Glucose, Bld: 121 mg/dL — ABNORMAL HIGH (ref 65–99)
Potassium: 3.5 mmol/L (ref 3.5–5.1)
Sodium: 135 mmol/L (ref 135–145)

## 2015-04-29 LAB — CBC WITH DIFFERENTIAL/PLATELET
Basophils Absolute: 0 10*3/uL (ref 0.0–0.1)
Basophils Relative: 0 %
Eosinophils Absolute: 0.1 10*3/uL (ref 0.0–0.7)
Eosinophils Relative: 1 %
HCT: 50.1 % (ref 39.0–52.0)
Hemoglobin: 17.6 g/dL — ABNORMAL HIGH (ref 13.0–17.0)
Lymphocytes Relative: 20 %
Lymphs Abs: 2.1 10*3/uL (ref 0.7–4.0)
MCH: 32.1 pg (ref 26.0–34.0)
MCHC: 35.1 g/dL (ref 30.0–36.0)
MCV: 91.4 fL (ref 78.0–100.0)
Monocytes Absolute: 1 10*3/uL (ref 0.1–1.0)
Monocytes Relative: 10 %
Neutro Abs: 7 10*3/uL (ref 1.7–7.7)
Neutrophils Relative %: 69 %
Platelets: 277 10*3/uL (ref 150–400)
RBC: 5.48 MIL/uL (ref 4.22–5.81)
RDW: 12.8 % (ref 11.5–15.5)
WBC: 10.1 10*3/uL (ref 4.0–10.5)

## 2015-04-29 LAB — URINALYSIS, ROUTINE W REFLEX MICROSCOPIC
Glucose, UA: NEGATIVE mg/dL
Ketones, ur: 15 mg/dL — AB
Leukocytes, UA: NEGATIVE
Nitrite: NEGATIVE
Protein, ur: 30 mg/dL — AB
Specific Gravity, Urine: 1.027 (ref 1.005–1.030)
pH: 5.5 (ref 5.0–8.0)

## 2015-04-29 MED ORDER — MORPHINE SULFATE (PF) 4 MG/ML IV SOLN
6.0000 mg | Freq: Once | INTRAVENOUS | Status: AC
  Administered 2015-04-29: 6 mg via INTRAVENOUS
  Filled 2015-04-29: qty 2

## 2015-04-29 MED ORDER — ONDANSETRON HCL 4 MG/2ML IJ SOLN
4.0000 mg | Freq: Once | INTRAMUSCULAR | Status: AC
  Administered 2015-04-29: 4 mg via INTRAVENOUS
  Filled 2015-04-29: qty 2

## 2015-04-29 MED ORDER — PROMETHAZINE HCL 25 MG PO TABS: 25.0000 mg | ORAL_TABLET | Freq: Four times a day (QID) | ORAL | Status: DC | PRN

## 2015-04-29 MED ORDER — CIPROFLOXACIN HCL 500 MG PO TABS: 500.0000 mg | ORAL_TABLET | Freq: Two times a day (BID) | ORAL | Status: DC

## 2015-04-29 MED ORDER — CIPROFLOXACIN IN D5W 400 MG/200ML IV SOLN
400.0000 mg | Freq: Once | INTRAVENOUS | Status: AC
  Administered 2015-04-29: 400 mg via INTRAVENOUS
  Filled 2015-04-29: qty 200

## 2015-04-29 MED ORDER — HYDROMORPHONE HCL 1 MG/ML IJ SOLN
1.0000 mg | Freq: Once | INTRAMUSCULAR | Status: AC
  Administered 2015-04-29: 1 mg via INTRAVENOUS
  Filled 2015-04-29: qty 1

## 2015-04-29 MED ORDER — KETOROLAC TROMETHAMINE 15 MG/ML IJ SOLN
15.0000 mg | Freq: Once | INTRAMUSCULAR | Status: AC
  Administered 2015-04-29: 15 mg via INTRAVENOUS
  Filled 2015-04-29: qty 1

## 2015-04-29 MED ORDER — SODIUM CHLORIDE 0.9 % IV BOLUS (SEPSIS)
1000.0000 mL | Freq: Once | INTRAVENOUS | Status: AC
  Administered 2015-04-29: 1000 mL via INTRAVENOUS

## 2015-04-29 MED ORDER — PROMETHAZINE HCL 25 MG/ML IJ SOLN
12.5000 mg | Freq: Once | INTRAMUSCULAR | Status: AC
  Administered 2015-04-29: 12.5 mg via INTRAVENOUS
  Filled 2015-04-29: qty 1

## 2015-04-29 MED ORDER — TRAMADOL HCL 50 MG PO TABS: 50.0000 mg | ORAL_TABLET | Freq: Four times a day (QID) | ORAL | Status: DC | PRN

## 2015-04-29 MED ORDER — MORPHINE SULFATE (PF) 4 MG/ML IV SOLN
4.0000 mg | Freq: Once | INTRAVENOUS | Status: AC
  Administered 2015-04-29: 4 mg via INTRAVENOUS
  Filled 2015-04-29: qty 1

## 2015-04-29 NOTE — ED Notes (Signed)
Reports abdominal pain that started yesterday.  Reports n/v.  Complains of bilateral flank pain as well.

## 2015-04-29 NOTE — ED Notes (Signed)
MD at bedside. 

## 2015-04-29 NOTE — Discharge Instructions (Signed)

## 2015-04-29 NOTE — ED Provider Notes (Signed)
CSN: [id number]     Arrival date & time 04/29/15  1511 History   First MD Initiated Contact with Patient 04/29/15 1521     Chief Complaint  Patient presents with  . Abdominal Pain     (Consider location/radiation/quality/duration/timing/severity/associated sxs/prior Treatment) HPI   39 year old male with lower abdominal and bilateral flank pain. Gradual onset about 2 days ago and progressively worsening since then. Constant. Describes it as "grabbing." No appreciable exacerbating relieving factors. Associated with some mild dysuria. Subjective fever. Nausea and vomited once yesterday. No diarrhea. No history of similar type symptoms. Past medical history does list prostatitis. When asked about this further he also reports that previously his "ureters collapsed." Previously seen by urology. Denies any rectal pain or lower back pain. No discharge. No past abdominal surgery.  Past Medical History  Diagnosis Date  . Prostatitis   . Reflux   . History of hematuria    Past Surgical History  Procedure Laterality Date  . Tonsillectomy    . Adenoidectomy    . Knee surgery    . Wisdom tooth extraction    . Ureter surgery     No family history on file. Social History  Substance Use Topics  . Smoking status: Current Every Day Smoker -- 0.50 packs/day for 15 years    Types: Cigarettes  . Smokeless tobacco: None  . Alcohol Use: No    Review of Systems  All systems reviewed and negative, other than as noted in HPI.   Allergies  Clindamycin/lincomycin; Penicillins; and Pineapple  Home Medications   Prior to Admission medications   Medication Sig Start Date End Date Taking? Authorizing Provider  cephALEXin (KEFLEX) 500 MG capsule 2 caps po bid x 7 days 01/05/14   Mercedes Camprubi-Soms, PA-C  ciprofloxacin (CIPRO) 500 MG tablet Take 1 tablet (500 mg total) by mouth 2 (two) times daily. One po bid x 7 days 09/24/14   Rolan Bucco, MD  cyclobenzaprine (FLEXERIL) 10 MG tablet Take 1  tablet (10 mg total) by mouth 2 (two) times daily as needed for muscle spasms. 02/03/15   Gwyneth Sprout, MD  diphenoxylate-atropine (LOMOTIL) 2.5-0.025 MG per tablet Take 1 tablet by mouth 4 (four) times daily as needed for diarrhea or loose stools. 05/29/14   Burgess Amor, PA-C  HYDROcodone-acetaminophen (NORCO/VICODIN) 5-325 MG per tablet Take 1-2 tablets by mouth every 4 (four) hours as needed. 09/24/14   Rolan Bucco, MD  ibuprofen (ADVIL,MOTRIN) 600 MG tablet Take 1 tablet (600 mg total) by mouth every 8 (eight) hours as needed. 07/07/14   Burgess Amor, PA-C  levofloxacin (LEVAQUIN) 500 MG tablet Take 1 tablet (500 mg total) by mouth daily. 05/08/14   Roxy Horseman, PA-C  naproxen (NAPROSYN) 500 MG tablet Take 1 tablet (500 mg total) by mouth 2 (two) times daily. 02/03/15   Gwyneth Sprout, MD  promethazine (PHENERGAN) 25 MG tablet Take 1 tablet (25 mg total) by mouth every 6 (six) hours as needed for nausea or vomiting. 05/29/14   Burgess Amor, PA-C  sulfamethoxazole-trimethoprim (SEPTRA DS) 800-160 MG per tablet Take 1 tablet by mouth every 12 (twelve) hours. 05/13/14   Burgess Amor, PA-C   BP 117/85 mmHg  Pulse 110  Temp(Src) 98.3 F (36.8 C) (Oral)  Resp 18  Ht  (1.778 m)  Wt 210 lb (95.255 kg)  BMI 30.13 kg/m2  SpO2 100% Physical Exam  Constitutional: He appears well-developed and well-nourished. No distress.  HENT:  Head: Normocephalic and atraumatic.  Eyes: Conjunctivae are normal. Right  eye exhibits no discharge. Left eye exhibits no discharge.  Neck: Neck supple.  Cardiovascular: Regular rhythm and normal heart sounds.  Exam reveals no gallop and no friction rub.   No murmur heard. Mild tachycardia  Pulmonary/Chest: Effort normal and breath sounds normal. No respiratory distress.  Abdominal: Soft. He exhibits no distension. There is no tenderness.  Tenderness across lower abdomen, worse suprapubically. CVA tenderness.  Musculoskeletal: He exhibits no edema or tenderness.   Neurological: He is alert.  Skin: Skin is warm and dry.  Psychiatric: He has a normal mood and affect. His behavior is normal. Thought content normal.  Nursing note and vitals reviewed.   ED Course  Procedures (including critical care time) Labs Review Labs Reviewed  URINALYSIS, ROUTINE W REFLEX MICROSCOPIC (NOT AT [facility]) - Abnormal; Notable for the following:    Color, Urine AMBER (*)    APPearance CLOUDY (*)    Hgb urine dipstick MODERATE (*)    Bilirubin Urine SMALL (*)    Ketones, ur 15 (*)    Protein, ur 30 (*)    All other components within normal limits  CBC WITH DIFFERENTIAL/PLATELET - Abnormal; Notable for the following:    Hemoglobin 17.6 (*)    All other components within normal limits  BASIC METABOLIC PANEL - Abnormal; Notable for the following:    Glucose, Bld 121 (*)    All other components within normal limits  URINE MICROSCOPIC-ADD ON - Abnormal; Notable for the following:    Squamous Epithelial / LPF 6-30 (*)    Bacteria, UA RARE (*)    All other components within normal limits    Imaging Review No results found. I have personally reviewed and evaluated these images and lab results as part of my medical decision-making.   EKG Interpretation None      MDM   Final diagnoses:  Lower abdominal pain        Raeford RazorStephen Davine Coba, MD 05/09/15 2056

## 2015-07-02 ENCOUNTER — Encounter (HOSPITAL_BASED_OUTPATIENT_CLINIC_OR_DEPARTMENT_OTHER): Payer: Self-pay | Admitting: *Deleted

## 2015-07-02 ENCOUNTER — Emergency Department (HOSPITAL_BASED_OUTPATIENT_CLINIC_OR_DEPARTMENT_OTHER)
Admission: EM | Admit: 2015-07-02 | Discharge: 2015-07-02 | Disposition: A | Discharge status: Home / Self Care | Payer: BLUE CROSS/BLUE SHIELD | Attending: Emergency Medicine | Admitting: Emergency Medicine

## 2015-07-02 DIAGNOSIS — Z87438 Personal history of other diseases of male genital organs: Secondary | ICD-10-CM | POA: Diagnosis not present

## 2015-07-02 DIAGNOSIS — Z792 Long term (current) use of antibiotics: Secondary | ICD-10-CM | POA: Insufficient documentation

## 2015-07-02 DIAGNOSIS — Z87442 Personal history of urinary calculi: Secondary | ICD-10-CM | POA: Insufficient documentation

## 2015-07-02 DIAGNOSIS — F17210 Nicotine dependence, cigarettes, uncomplicated: Secondary | ICD-10-CM | POA: Diagnosis not present

## 2015-07-02 DIAGNOSIS — Z8719 Personal history of other diseases of the digestive system: Secondary | ICD-10-CM | POA: Diagnosis not present

## 2015-07-02 DIAGNOSIS — Z791 Long term (current) use of non-steroidal anti-inflammatories (NSAID): Secondary | ICD-10-CM | POA: Diagnosis not present

## 2015-07-02 DIAGNOSIS — M545 Low back pain, unspecified: Secondary | ICD-10-CM

## 2015-07-02 DIAGNOSIS — Z880 Allergy status to penicillin: Secondary | ICD-10-CM | POA: Diagnosis not present

## 2015-07-02 DIAGNOSIS — R300 Dysuria: Secondary | ICD-10-CM

## 2015-07-02 LAB — URINE MICROSCOPIC-ADD ON: WBC, UA: NONE SEEN WBC/hpf (ref 0–5)

## 2015-07-02 LAB — URINALYSIS, ROUTINE W REFLEX MICROSCOPIC
Bilirubin Urine: NEGATIVE
Ketones, ur: NEGATIVE mg/dL
LEUKOCYTES UA: NEGATIVE
Nitrite: NEGATIVE
PH: 5.5 (ref 5.0–8.0)
Protein, ur: NEGATIVE mg/dL
Specific Gravity, Urine: 1.027 (ref 1.005–1.030)

## 2015-07-02 MED ORDER — OXYCODONE-ACETAMINOPHEN 5-325 MG PO TABS: 1.0000 | ORAL_TABLET | Freq: Four times a day (QID) | ORAL | Status: DC | PRN

## 2015-07-02 MED ORDER — ONDANSETRON HCL 4 MG/2ML IJ SOLN
4.0000 mg | Freq: Once | INTRAMUSCULAR | Status: AC
  Administered 2015-07-02: 4 mg via INTRAVENOUS
  Filled 2015-07-02: qty 2

## 2015-07-02 MED ORDER — SULFAMETHOXAZOLE-TRIMETHOPRIM 800-160 MG PO TABS
1.0000 | ORAL_TABLET | Freq: Once | ORAL | Status: AC
  Administered 2015-07-02: 1 via ORAL
  Filled 2015-07-02: qty 1

## 2015-07-02 MED ORDER — IBUPROFEN 800 MG PO TABS
800.0000 mg | ORAL_TABLET | Freq: Once | ORAL | Status: AC
  Administered 2015-07-02: 800 mg via ORAL
  Filled 2015-07-02: qty 1

## 2015-07-02 MED ORDER — OXYCODONE-ACETAMINOPHEN 5-325 MG PO TABS
1.0000 | ORAL_TABLET | Freq: Once | ORAL | Status: AC
  Administered 2015-07-02: 1 via ORAL
  Filled 2015-07-02: qty 1

## 2015-07-02 MED ORDER — SODIUM CHLORIDE 0.9 % IV BOLUS (SEPSIS)
1000.0000 mL | Freq: Once | INTRAVENOUS | Status: AC
  Administered 2015-07-02: 1000 mL via INTRAVENOUS

## 2015-07-02 MED ORDER — SULFAMETHOXAZOLE-TRIMETHOPRIM 800-160 MG PO TABS: 1.0000 | ORAL_TABLET | Freq: Two times a day (BID) | ORAL | Status: AC

## 2015-07-02 MED ORDER — MORPHINE SULFATE (PF) 2 MG/ML IV SOLN
2.0000 mg | Freq: Once | INTRAVENOUS | Status: AC
  Administered 2015-07-02: 2 mg via INTRAVENOUS
  Filled 2015-07-02: qty 1

## 2015-07-02 NOTE — ED Provider Notes (Signed)
CSN: [id number]     Arrival date & time 07/02/15  1828 History   First MD Initiated Contact with Patient 07/02/15 1936     Chief Complaint  Patient presents with  . Back Pain    HPI  Terrance Hodges is an 40 y.o. male with history of ureteral stents and kidney stones who presents to the ED for evaluation of back pain, dysuria, and urinary hesitancy. Pt was seen in the [facility] ED two days ago for back pain/flank pain and found to have evidence of microscopic hematuria. His CT renal study at that time showed e/o infectious etiology vs recent passing of calculus. Pt states he was prescribed vicodin but continues to have back pain and abdominal pain. He reports two episodes of gross hematuria yesterday, none today. Denies penile discharge, erythema, or swelling. States he has mild dysuria but feels more like he has urinary hesitancy where he feels like he needs to urinate but does not have as strong a stream as usual. He states he is still able to empty his bladder. He states he reports intermittent nausea but denies emesis. States he has a urology appointment scheduled for the beginning of February.   Past Medical History  Diagnosis Date  . Prostatitis   . Reflux   . History of hematuria    Past Surgical History  Procedure Laterality Date  . Tonsillectomy    . Adenoidectomy    . Knee surgery    . Wisdom tooth extraction    . Ureter surgery     No family history on file. Social History  Substance Use Topics  . Smoking status: Current Every Day Smoker -- 0.50 packs/day for 15 years    Types: Cigarettes  . Smokeless tobacco: None  . Alcohol Use: No    Review of Systems  All other systems reviewed and are negative.     Allergies  Clindamycin/lincomycin; Penicillins; and Pineapple  Home Medications   Prior to Admission medications   Medication Sig Start Date End Date Taking? Authorizing Provider  cephALEXin (KEFLEX) 500 MG capsule 2 caps po bid x 7 days 01/05/14   Mercedes Camprubi-Soms,  PA-C  ciprofloxacin (CIPRO) 500 MG tablet Take 1 tablet (500 mg total) by mouth 2 (two) times daily. 04/29/15   Raeford Razor, MD  cyclobenzaprine (FLEXERIL) 10 MG tablet Take 1 tablet (10 mg total) by mouth 2 (two) times daily as needed for muscle spasms. 02/03/15   Gwyneth Sprout, MD  diphenoxylate-atropine (LOMOTIL) 2.5-0.025 MG per tablet Take 1 tablet by mouth 4 (four) times daily as needed for diarrhea or loose stools. 05/29/14   Burgess Amor, PA-C  HYDROcodone-acetaminophen (NORCO/VICODIN) 5-325 MG per tablet Take 1-2 tablets by mouth every 4 (four) hours as needed. 09/24/14   Rolan Bucco, MD  ibuprofen (ADVIL,MOTRIN) 600 MG tablet Take 1 tablet (600 mg total) by mouth every 8 (eight) hours as needed. 07/07/14   Burgess Amor, PA-C  levofloxacin (LEVAQUIN) 500 MG tablet Take 1 tablet (500 mg total) by mouth daily. 05/08/14   Roxy Horseman, PA-C  naproxen (NAPROSYN) 500 MG tablet Take 1 tablet (500 mg total) by mouth 2 (two) times daily. 02/03/15   Gwyneth Sprout, MD  promethazine (PHENERGAN) 25 MG tablet Take 1 tablet (25 mg total) by mouth every 6 (six) hours as needed for nausea or vomiting. 04/29/15   Raeford Razor, MD  sulfamethoxazole-trimethoprim (SEPTRA DS) 800-160 MG per tablet Take 1 tablet by mouth every 12 (twelve) hours. 05/13/14   Burgess Amor, PA-C  traMADol (ULTRAM) 50 MG tablet Take 1 tablet (50 mg total) by mouth every 6 (six) hours as needed. 04/29/15   Raeford Razor, MD   BP 131/80 mmHg  Pulse 95  Temp(Src) 97.6 F (36.4 C) (Oral)  Resp 18  Ht  (1.778 m)  Wt 95.255 kg  BMI 30.13 kg/m2  SpO2 98% Physical Exam  Constitutional: He is oriented to person, place, and time.  HENT:  Right Ear: External ear normal.  Left Ear: External ear normal.  Nose: Nose normal.  Mouth/Throat: Oropharynx is clear and moist. No oropharyngeal exudate.  Eyes: Conjunctivae and EOM are normal. Pupils are equal, round, and reactive to light.  Neck: Normal range of motion. Neck supple.   Cardiovascular: Normal rate, regular rhythm, normal heart sounds and intact distal pulses.   Pulmonary/Chest: Effort normal and breath sounds normal. No respiratory distress. He has no wheezes. He exhibits no tenderness.  Abdominal: Soft. Bowel sounds are normal. He exhibits no distension. There is no tenderness. There is no rebound, no guarding and no CVA tenderness.  Musculoskeletal: He exhibits no edema.  No midline back tenderness. No CVA tenderness. No paraspinal tenderness.   Neurological: He is alert and oriented to person, place, and time. No cranial nerve deficit.  Skin: Skin is warm and dry.  Psychiatric: He has a normal mood and affect.  Nursing note and vitals reviewed.   Filed Vitals:   07/02/15 1839 07/02/15 2012  BP: 131/80 124/69  Pulse: 95 89  Temp: 97.6 F (36.4 C)   TempSrc: Oral   Resp: 18 18  Height:  (1.778 m)   Weight: 95.255 kg   SpO2: 98% 100%     ED Course  Procedures (including critical care time) Labs Review Labs Reviewed  URINALYSIS, ROUTINE W REFLEX MICROSCOPIC (NOT AT [facility]) - Abnormal; Notable for the following:    Glucose, UA >1000 (*)    Hgb urine dipstick MODERATE (*)    All other components within normal limits  URINE MICROSCOPIC-ADD ON - Abnormal; Notable for the following:    Squamous Epithelial / LPF 0-5 (*)    Bacteria, UA RARE (*)    All other components within normal limits  URINE CULTURE    Imaging Review No results found. I have personally reviewed and evaluated these images and lab results as part of my medical decision-making.   EKG Interpretation None      MDM   Final diagnoses:  Bilateral low back pain without sciatica  Dysuria    Recent CT Renal study at [facility] shows e/o infection vs recent calculus passing. Pt with continued back pain, abdominal pain, and inermittent dysuria/urinary hesitancy. His UA today shows moderate hemoglobin and rare bacteria. Negative leuks negative nitrites. There is glucose as  well. Pt given 1L NS bolus, ibuprofen, and percocet with mild relief of symptoms. I discussed with pt that given his symptoms we can cover with antibiotic for possible UTI and send urine for culture. He is requesting more pain medication prior to discharge. Discussed that will give one shot of IV pain meds and can give short course of percocet to try at home instead of vicodin and encouraged NSAID use as well. Pt is otherwise afebrile with no focal exam findings. He is not tachycardic, hypoxic, or hypotensive. He is stable for discharge. Instructed to f/u with urology as scheduled or sooner if possible. ER return precautions given.     Terrance Coria, PA-C 07/02/15 2131  Alvira Monday, MD 07/03/15 1309

## 2015-07-02 NOTE — ED Notes (Signed)
Back pain. He was seen at [facility] earlier in the week for possible kidney stone. Today he saw blood in his urine and his pain has gotten worse.

## 2015-07-02 NOTE — Discharge Instructions (Signed)
You were seen in the emergency room today for evaluation of back pain, abdominal pain, and urinary issues. Your urine test did not show obvious signs of infection. However, given your symptoms and recent CT scan we will go ahead and give you a prescription for Bactrim, an antibiotic. I will also give you a small prescription for Percocet to take if you feel like the Vicodin at home is not working. Do not take both together. You may take ibuprofen or naproxen in addition to Percocet to help with pain and inflammation. Please follow up with urology as scheduled or sooner if possible. Return to the ER for new or worsening symptoms.

## 2015-07-04 LAB — URINE CULTURE

## 2015-07-27 ENCOUNTER — Encounter (HOSPITAL_BASED_OUTPATIENT_CLINIC_OR_DEPARTMENT_OTHER): Payer: Self-pay | Admitting: Emergency Medicine

## 2015-07-27 ENCOUNTER — Emergency Department (HOSPITAL_BASED_OUTPATIENT_CLINIC_OR_DEPARTMENT_OTHER)
Admission: EM | Admit: 2015-07-27 | Discharge: 2015-07-27 | Disposition: A | Discharge status: Home / Self Care | Payer: BLUE CROSS/BLUE SHIELD | Attending: Emergency Medicine | Admitting: Emergency Medicine

## 2015-07-27 ENCOUNTER — Emergency Department (HOSPITAL_BASED_OUTPATIENT_CLINIC_OR_DEPARTMENT_OTHER): Payer: BLUE CROSS/BLUE SHIELD

## 2015-07-27 DIAGNOSIS — Z880 Allergy status to penicillin: Secondary | ICD-10-CM | POA: Insufficient documentation

## 2015-07-27 DIAGNOSIS — F17210 Nicotine dependence, cigarettes, uncomplicated: Secondary | ICD-10-CM | POA: Diagnosis not present

## 2015-07-27 DIAGNOSIS — Z87438 Personal history of other diseases of male genital organs: Secondary | ICD-10-CM | POA: Insufficient documentation

## 2015-07-27 DIAGNOSIS — Z8719 Personal history of other diseases of the digestive system: Secondary | ICD-10-CM | POA: Insufficient documentation

## 2015-07-27 DIAGNOSIS — R319 Hematuria, unspecified: Secondary | ICD-10-CM | POA: Insufficient documentation

## 2015-07-27 DIAGNOSIS — Z87442 Personal history of urinary calculi: Secondary | ICD-10-CM | POA: Diagnosis not present

## 2015-07-27 DIAGNOSIS — Z791 Long term (current) use of non-steroidal anti-inflammatories (NSAID): Secondary | ICD-10-CM | POA: Diagnosis not present

## 2015-07-27 DIAGNOSIS — M545 Low back pain, unspecified: Secondary | ICD-10-CM

## 2015-07-27 DIAGNOSIS — R109 Unspecified abdominal pain: Secondary | ICD-10-CM | POA: Insufficient documentation

## 2015-07-27 DIAGNOSIS — Z792 Long term (current) use of antibiotics: Secondary | ICD-10-CM | POA: Diagnosis not present

## 2015-07-27 DIAGNOSIS — Z79899 Other long term (current) drug therapy: Secondary | ICD-10-CM | POA: Insufficient documentation

## 2015-07-27 LAB — URINALYSIS, ROUTINE W REFLEX MICROSCOPIC
Bilirubin Urine: NEGATIVE
GLUCOSE, UA: 100 mg/dL — AB
Ketones, ur: NEGATIVE mg/dL
LEUKOCYTES UA: NEGATIVE
NITRITE: NEGATIVE
PH: 6 (ref 5.0–8.0)
PROTEIN: NEGATIVE mg/dL
Specific Gravity, Urine: 1.008 (ref 1.005–1.030)

## 2015-07-27 LAB — URINE MICROSCOPIC-ADD ON: WBC UA: NONE SEEN WBC/hpf (ref 0–5)

## 2015-07-27 LAB — BASIC METABOLIC PANEL
Anion gap: 7 (ref 5–15)
BUN: 10 mg/dL (ref 6–20)
CO2: 26 mmol/L (ref 22–32)
Calcium: 8.9 mg/dL (ref 8.9–10.3)
Chloride: 106 mmol/L (ref 101–111)
Creatinine, Ser: 0.7 mg/dL (ref 0.61–1.24)
GFR calc Af Amer: >60 mL/min (ref >60)
GFR calc non Af Amer: >60 mL/min (ref >60)
Glucose, Bld: 146 mg/dL — ABNORMAL HIGH (ref 65–99)
Potassium: 3.7 mmol/L (ref 3.5–5.1)
Sodium: 139 mmol/L (ref 135–145)

## 2015-07-27 MED ORDER — OXYCODONE-ACETAMINOPHEN 5-325 MG PO TABS
1.0000 | ORAL_TABLET | Freq: Once | ORAL | Status: AC
  Administered 2015-07-27: 1 via ORAL
  Filled 2015-07-27: qty 1

## 2015-07-27 NOTE — ED Provider Notes (Signed)
CSN: [id number]     Arrival date & time 07/27/15  1549 History   First MD Initiated Contact with Patient 07/27/15 1725     Chief Complaint  Patient presents with  . Flank Pain    HPI   40 year old male presents today with right back and flank pain. Patient reports a history of the same, was seen in the emergency room and diagnosed with probable kidney stone per patient. He was sent to urologist but notes that urologist had a canceled appointment was unable to be seen. Patient reports symptoms did not completely improve after initial onset and haven't worsened over the last several days. Patient denies the pain is sharp in the bilateral lower back and right flank. Patient also notes that he's having some aching in the testicles that have been present for several days as well. He denies any testicular swelling, edema, discharge from the penis, and the perineum or fullness. Normal urinations without difficulty or change in the color clarity or characteristics with the exception of 2 episodes of blood in his urine. Patient denies any fever, chills, nausea, vomiting, abdominal pain, chest pain or shortness of breath. Patient denies any lower abdominal pain, he denies any loss of distal sensation strength or motor function, saddle anesthesia, or any other acute concerning red flags for back pain. Patient reports that he takes Tylenol at home this last for approximately 30 minutes with return of pain thereafter. Patient denies any aggravating event, but notes that flexion of the lower back causes more significant pain. Pt denies any weight loss, fever, night sweats.     Past Medical History  Diagnosis Date  . Prostatitis   . Reflux   . History of hematuria    Past Surgical History  Procedure Laterality Date  . Tonsillectomy    . Adenoidectomy    . Knee surgery    . Wisdom tooth extraction    . Ureter surgery     History reviewed. No pertinent family history. Social History  Substance Use  Topics  . Smoking status: Current Every Day Smoker -- 0.50 packs/day for 15 years    Types: Cigarettes  . Smokeless tobacco: None  . Alcohol Use: No    Review of Systems  All other systems reviewed and are negative.   Allergies  Clindamycin/lincomycin; Penicillins; Pineapple; and Toradol  Home Medications   Prior to Admission medications   Medication Sig Start Date End Date Taking? Authorizing Provider  cephALEXin (KEFLEX) 500 MG capsule 2 caps po bid x 7 days 01/05/14   Mercedes Camprubi-Soms, PA-C  ciprofloxacin (CIPRO) 500 MG tablet Take 1 tablet (500 mg total) by mouth 2 (two) times daily. 04/29/15   Raeford Razor, MD  cyclobenzaprine (FLEXERIL) 10 MG tablet Take 1 tablet (10 mg total) by mouth 2 (two) times daily as needed for muscle spasms. 02/03/15   Gwyneth Sprout, MD  diphenoxylate-atropine (LOMOTIL) 2.5-0.025 MG per tablet Take 1 tablet by mouth 4 (four) times daily as needed for diarrhea or loose stools. 05/29/14   Burgess Amor, PA-C  HYDROcodone-acetaminophen (NORCO/VICODIN) 5-325 MG per tablet Take 1-2 tablets by mouth every 4 (four) hours as needed. 09/24/14   Rolan Bucco, MD  ibuprofen (ADVIL,MOTRIN) 600 MG tablet Take 1 tablet (600 mg total) by mouth every 8 (eight) hours as needed. 07/07/14   Burgess Amor, PA-C  levofloxacin (LEVAQUIN) 500 MG tablet Take 1 tablet (500 mg total) by mouth daily. 05/08/14   Roxy Horseman, PA-C  naproxen (NAPROSYN) 500 MG tablet Take 1  tablet (500 mg total) by mouth 2 (two) times daily. 02/03/15   Gwyneth Sprout, MD  oxyCODONE-acetaminophen (PERCOCET/ROXICET) 5-325 MG tablet Take 1 tablet by mouth every 6 (six) hours as needed for severe pain. 07/02/15   Ace Gins Sam, PA-C  promethazine (PHENERGAN) 25 MG tablet Take 1 tablet (25 mg total) by mouth every 6 (six) hours as needed for nausea or vomiting. 04/29/15   Raeford Razor, MD  sulfamethoxazole-trimethoprim (SEPTRA DS) 800-160 MG per tablet Take 1 tablet by mouth every 12 (twelve) hours. 05/13/14    Burgess Amor, PA-C  traMADol (ULTRAM) 50 MG tablet Take 1 tablet (50 mg total) by mouth every 6 (six) hours as needed. 04/29/15   Raeford Razor, MD   BP 115/78 mmHg  Pulse 88  Temp(Src) 99 F (37.2 C) (Oral)  Resp 16  Ht  (1.778 m)  Wt 95.255 kg  BMI 30.13 kg/m2  SpO2 100%    Physical Exam  Constitutional: He is oriented to person, place, and time. He appears well-developed and well-nourished.  HENT:  Head: Normocephalic and atraumatic.  Eyes: Conjunctivae are normal. Pupils are equal, round, and reactive to light. Right eye exhibits no discharge. Left eye exhibits no discharge. No scleral icterus.  Neck: Normal range of motion. No JVD present. No tracheal deviation present.  Pulmonary/Chest: Effort normal and breath sounds normal. No stridor. No respiratory distress. He has no wheezes. He has no rales. He exhibits no tenderness.  Abdominal: He exhibits no distension and no mass. There is no tenderness. There is no rebound and no guarding.  Genitourinary:  Testicles equal bilateral, no significant tenderness to palpation, no swelling or edema, no discharge from the circumcised penis, no rashes.  Musculoskeletal:  Patient has no CT or L-spine tenderness. No obvious signs of infectious etiology, he has bilateral lower lumbar soft tissue tenderness to palpation. Pain is made worse with 4 flexion of the lumbar spine. 5 out of 5 lotion be strength, sensation intact, pedal pulses 2+, Refill less than 3 seconds, patellar reflexes 2+. [facility] without difficulty.  Neurological: He is alert and oriented to person, place, and time. Coordination normal.  Psychiatric: He has a normal mood and affect. His behavior is normal. Judgment and thought content normal.  Nursing note and vitals reviewed.   ED Course  Procedures (including critical care time) Labs Review Labs Reviewed  URINALYSIS, ROUTINE W REFLEX MICROSCOPIC (NOT AT [facility]) - Abnormal; Notable for the following:    Glucose,  UA 100 (*)    Hgb urine dipstick SMALL (*)    All other components within normal limits  URINE MICROSCOPIC-ADD ON - Abnormal; Notable for the following:    Squamous Epithelial / LPF 0-5 (*)    Bacteria, UA RARE (*)    All other components within normal limits  BASIC METABOLIC PANEL - Abnormal; Notable for the following:    Glucose, Bld 146 (*)    All other components within normal limits    Imaging Review Ct Renal Stone Study  07/27/2015  CLINICAL DATA:  Patient states that he was here a few weeks ago for the blood in his urine and they felt like her passed a kidney stone. The patient reports that he continues to have pain to his bilateral flank down into his testicles. EXAM: CT ABDOMEN AND PELVIS WITHOUT CONTRAST TECHNIQUE: Multidetector CT imaging of the abdomen and pelvis was performed following the standard protocol without IV contrast. COMPARISON:  09/23/2014 FINDINGS: Lower chest:  No acute findings. Hepatobiliary: No  mass visualized on this un-enhanced exam. Pancreas: No mass or inflammatory process identified on this un-enhanced exam. Spleen: Within normal limits in size. Adrenals/Urinary Tract: No evidence of urolithiasis or hydronephrosis. No definite mass visualized on this un-enhanced exam. Stomach/Bowel: No evidence of obstruction, inflammatory process, or abnormal fluid collections. Normal appendix. Vascular/Lymphatic: No pathologically enlarged lymph nodes. No evidence of abdominal aortic aneurysm. Reproductive: No mass or other significant abnormality. Other: No ascites.  No free air. Musculoskeletal:  No suspicious bone lesions identified. IMPRESSION: No acute abnormality.  No nephrolithiasis or hydronephrosis. Electronically Signed   By: Corlis Leak M.D.   On: 07/27/2015 18:32   I have personally reviewed and evaluated these images and lab results as part of my medical decision-making.   EKG Interpretation None      MDM   Final diagnoses:  Bilateral low back pain without  sciatica    Labs: Urinalysis and BMP  Imaging: CT renal stone study-no acute findings  Consults:  Therapeutics:  Discharge Meds:   Assessment/Plan: 40 year old male presents today with bilateral back pain, left flank pain. She has been seen previously for the same with no significant findings. Today's presentation again with no findings on CT and urinalysis to explain patient's back pain. He is tender to palpation of bilateral lower lumbar soft tissue and musculature. This is most likely musculoskeletal back pain with no red flags. Patient is encouraged to use ibuprofen or Tylenol, ice, massage, back exercises, and follow up with orthopedic specialist further evaluation and management. Patient is given strict return precautions, verbalized understanding and agreement and had no further questions or concerns at time of discharge         Eyvonne Mechanic, PA-C 07/27/15 1902  Lavera Guise, MD 07/28/15 [phone]

## 2015-07-27 NOTE — ED Notes (Signed)
Patient states that he was here a few weeks ago for the blood in his urine and they felt like her passed a kidney stone. The patient reports that he continues to have pain to his bilateral flank down into his testicles. The patient reports that he has blood in urine

## 2015-07-27 NOTE — ED Notes (Signed)
Patient transported to CT 

## 2015-07-27 NOTE — Discharge Instructions (Signed)
Back Pain, Adult °Back pain is very common in adults. The cause of back pain is rarely dangerous and the pain often gets better over time. The cause of your back pain may not be known. Some common causes of back pain include: °1. Strain of the muscles or ligaments supporting the spine. °2. Wear and tear (degeneration) of the spinal disks. °3. Arthritis. °4. Direct injury to the back. °For many people, back pain may return. Since back pain is rarely dangerous, most people can learn to manage this condition on their own. °HOME CARE INSTRUCTIONS °Watch your back pain for any changes. The following actions may help to lessen any discomfort you are feeling: °1. Remain active. It is stressful on your back to sit or stand in one place for long periods of time. Do not sit, drive, or stand in one place for more than 30 minutes at a time. Take short walks on even surfaces as soon as you are able. Try to increase the length of time you walk each day. °2. Exercise regularly as directed by your health care provider. Exercise helps your back heal faster. It also helps avoid future injury by keeping your muscles strong and flexible. °3. Do not stay in bed. Resting more than 1-2 days can delay your recovery. °4. Pay attention to your body when you bend and lift. The most comfortable positions are those that put less stress on your recovering back. Always use proper lifting techniques, including: °1. Bending your knees. °2. Keeping the load close to your body. °3. Avoiding twisting. °5. Find a comfortable position to sleep. Use a firm mattress and lie on your side with your knees slightly bent. If you lie on your back, put a pillow under your knees. °6. Avoid feeling anxious or stressed. Stress increases muscle tension and can worsen back pain. It is important to recognize when you are anxious or stressed and learn ways to manage it, such as with exercise. °7. Take medicines only as directed by your health care provider.  Over-the-counter medicines to reduce pain and inflammation are often the most helpful. Your health care provider may prescribe muscle relaxant drugs. These medicines help dull your pain so you can more quickly return to your normal activities and healthy exercise. °8. Apply ice to the injured area: °1. Put ice in a plastic bag. °2. Place a towel between your skin and the bag. °3. Leave the ice on for 20 minutes, 2-3 times a day for the first 2-3 days. After that, ice and heat may be alternated to reduce pain and spasms. °9. Maintain a healthy weight. Excess weight puts extra stress on your back and makes it difficult to maintain good posture. °SEEK MEDICAL CARE IF: °1. You have pain that is not relieved with rest or medicine. °2. You have increasing pain going down into the legs or buttocks. °3. You have pain that does not improve in one week. °4. You have night pain. °5. You lose weight. °6. You have a fever or chills. °SEEK IMMEDIATE MEDICAL CARE IF:  °1. You develop new bowel or bladder control problems. °2. You have unusual weakness or numbness in your arms or legs. °3. You develop nausea or vomiting. °4. You develop abdominal pain. °5. You feel faint. °  °This information is not intended to replace advice given to you by your health care provider. Make sure you discuss any questions you have with your health care provider. °  °Document Released: 05/23/2005 Document Revised: 06/13/2014 Document Reviewed: 09/24/2013 °[employer]vier Interactive Patient Education ©2016 [employer] ° °Back Exercises °The following exercises strengthen the muscles that help to support the back. They also help to keep the lower back flexible. Doing these exercises can help to prevent back pain or lessen existing pain. °If you have back pain or discomfort, try doing these exercises 2-3 times each day or as told by your health care provider. When the pain goes away, do them once each day, but increase the number of times that you repeat the  steps for each exercise (do more repetitions). If you do not have back pain or discomfort, do these exercises once each day or as told by your health care provider. °EXERCISES °Single Knee to Chest °Repeat these steps 3-5 times for each leg: °5. Lie on your back on a firm bed or the floor with your legs extended. °6. Bring one knee to your chest. Your other leg should stay extended and in contact with the floor. °7. Hold your knee in place by grabbing your knee or thigh. °8. Pull on your knee until you feel a gentle stretch in your lower back. °9. Hold the stretch for 10-30 seconds. °10. Slowly release and straighten your leg. °Pelvic Tilt °Repeat these steps 5-10 times: °10. Lie on your back on a firm bed or the floor with your legs extended. °11. Bend your knees so they are pointing toward the ceiling and your feet are flat on the floor. °12. Tighten your lower abdominal muscles to press your lower back against the floor. This motion will tilt your pelvis so your tailbone points up toward the ceiling instead of pointing to your feet or the floor. °13. With gentle tension and even breathing, hold this position for 5-10 seconds. °Cat-Cow °Repeat these steps until your lower back becomes more flexible: °7. Get into a hands-and-knees position on a firm surface. Keep your hands under your shoulders, and keep your knees under your hips. You may place padding under your knees for comfort. °8. Let your head hang down, and point your tailbone toward the floor so your lower back becomes rounded like the back of a cat. °9. Hold this position for 5 seconds. °10. Slowly lift your head and point your tailbone up toward the ceiling so your back forms a sagging arch like the back of a cow. °11. Hold this position for 5 seconds. °Press-Ups °Repeat these steps 5-10 times: °6. Lie on your abdomen (face-down) on the floor. °7. Place your palms near your head, about shoulder-width apart. °8. While you keep your back as relaxed as  possible and keep your hips on the floor, slowly straighten your arms to raise the top half of your body and lift your shoulders. Do not use your back muscles to raise your upper torso. You may adjust the placement of your hands to make yourself more comfortable. °9. Hold this position for 5 seconds while you keep your back relaxed. °10. Slowly return to lying flat on the floor. °Bridges °Repeat these steps 10 times: °1. Lie on your back on a firm surface. °2. Bend your knees so they are pointing toward the ceiling and your feet are flat on the floor. °3. Tighten your buttocks muscles and lift your buttocks off of the floor until your waist is at almost the same height as your knees. You should feel the muscles working in your buttocks and the back of your thighs. If you do not feel these muscles, slide your feet 1-2 inches farther away from your buttocks. °4. Hold this   position for 3-5 seconds. °5. Slowly lower your hips to the starting position, and allow your buttocks muscles to relax completely. °If this exercise is too easy, try doing it with your arms crossed over your chest. °Abdominal Crunches °Repeat these steps 5-10 times: °1. Lie on your back on a firm bed or the floor with your legs extended. °2. Bend your knees so they are pointing toward the ceiling and your feet are flat on the floor. °3. Cross your arms over your chest. °4. Tip your chin slightly toward your chest without bending your neck. °5. Tighten your abdominal muscles and slowly raise your trunk (torso) high enough to lift your shoulder blades a tiny bit off of the floor. Avoid raising your torso higher than that, because it can put too much stress on your low back and it does not help to strengthen your abdominal muscles. °6. Slowly return to your starting position. °Back Lifts °Repeat these steps 5-10 times: °1. Lie on your abdomen (face-down) with your arms at your sides, and rest your forehead on the floor. °2. Tighten the muscles in your  legs and your buttocks. °3. Slowly lift your chest off of the floor while you keep your hips pressed to the floor. Keep the back of your head in line with the curve in your back. Your eyes should be looking at the floor. °4. Hold this position for 3-5 seconds. °5. Slowly return to your starting position. °SEEK MEDICAL CARE IF: °· Your back pain or discomfort gets much worse when you do an exercise. °· Your back pain or discomfort does not lessen within 2 hours after you exercise. °If you have any of these problems, stop doing these exercises right away. Do not do them again unless your health care provider says that you can. °SEEK IMMEDIATE MEDICAL CARE IF: °· You develop sudden, severe back pain. If this happens, stop doing the exercises right away. Do not do them again unless your health care provider says that you can. °  °This information is not intended to replace advice given to you by your health care provider. Make sure you discuss any questions you have with your health care provider. °  °Document Released: 06/30/2004 Document Revised: 02/11/2015 Document Reviewed: 07/17/2014 °Elsevier Interactive Patient Education ©2016 [employer] ° °

## 2015-08-27 ENCOUNTER — Emergency Department (HOSPITAL_BASED_OUTPATIENT_CLINIC_OR_DEPARTMENT_OTHER): Payer: BLUE CROSS/BLUE SHIELD

## 2015-08-27 ENCOUNTER — Other Ambulatory Visit: Payer: Self-pay

## 2015-08-27 ENCOUNTER — Emergency Department (HOSPITAL_BASED_OUTPATIENT_CLINIC_OR_DEPARTMENT_OTHER)
Admission: EM | Admit: 2015-08-27 | Discharge: 2015-08-28 | Disposition: A | Discharge status: Home / Self Care | Payer: BLUE CROSS/BLUE SHIELD | Attending: Emergency Medicine | Admitting: Emergency Medicine

## 2015-08-27 ENCOUNTER — Encounter (HOSPITAL_BASED_OUTPATIENT_CLINIC_OR_DEPARTMENT_OTHER): Payer: Self-pay | Admitting: *Deleted

## 2015-08-27 DIAGNOSIS — Z79899 Other long term (current) drug therapy: Secondary | ICD-10-CM | POA: Diagnosis not present

## 2015-08-27 DIAGNOSIS — R51 Headache: Secondary | ICD-10-CM | POA: Insufficient documentation

## 2015-08-27 DIAGNOSIS — Z792 Long term (current) use of antibiotics: Secondary | ICD-10-CM | POA: Diagnosis not present

## 2015-08-27 DIAGNOSIS — Z880 Allergy status to penicillin: Secondary | ICD-10-CM | POA: Diagnosis not present

## 2015-08-27 DIAGNOSIS — Z791 Long term (current) use of non-steroidal anti-inflammatories (NSAID): Secondary | ICD-10-CM | POA: Insufficient documentation

## 2015-08-27 DIAGNOSIS — R109 Unspecified abdominal pain: Secondary | ICD-10-CM | POA: Insufficient documentation

## 2015-08-27 DIAGNOSIS — R3915 Urgency of urination: Secondary | ICD-10-CM | POA: Insufficient documentation

## 2015-08-27 DIAGNOSIS — R319 Hematuria, unspecified: Secondary | ICD-10-CM | POA: Insufficient documentation

## 2015-08-27 DIAGNOSIS — R110 Nausea: Secondary | ICD-10-CM | POA: Diagnosis not present

## 2015-08-27 DIAGNOSIS — R300 Dysuria: Secondary | ICD-10-CM | POA: Diagnosis present

## 2015-08-27 DIAGNOSIS — K219 Gastro-esophageal reflux disease without esophagitis: Secondary | ICD-10-CM | POA: Insufficient documentation

## 2015-08-27 DIAGNOSIS — Z87438 Personal history of other diseases of male genital organs: Secondary | ICD-10-CM | POA: Diagnosis not present

## 2015-08-27 DIAGNOSIS — F17210 Nicotine dependence, cigarettes, uncomplicated: Secondary | ICD-10-CM | POA: Diagnosis not present

## 2015-08-27 DIAGNOSIS — R55 Syncope and collapse: Secondary | ICD-10-CM | POA: Diagnosis not present

## 2015-08-27 LAB — COMPREHENSIVE METABOLIC PANEL
ALT: 34 U/L (ref 17–63)
AST: 20 U/L (ref 15–41)
Albumin: 4.4 g/dL (ref 3.5–5.0)
Alkaline Phosphatase: 67 U/L (ref 38–126)
Anion gap: 9 (ref 5–15)
BILIRUBIN TOTAL: 0.5 mg/dL (ref 0.3–1.2)
BUN: 9 mg/dL (ref 6–20)
CHLORIDE: 104 mmol/L (ref 101–111)
CO2: 27 mmol/L (ref 22–32)
CREATININE: 0.8 mg/dL (ref 0.61–1.24)
Calcium: 9.1 mg/dL (ref 8.9–10.3)
GFR calc Af Amer: >60 mL/min (ref >60)
Glucose, Bld: 123 mg/dL — ABNORMAL HIGH (ref 65–99)
Potassium: 3.7 mmol/L (ref 3.5–5.1)
Sodium: 140 mmol/L (ref 135–145)
TOTAL PROTEIN: 6.9 g/dL (ref 6.5–8.1)

## 2015-08-27 LAB — URINALYSIS, ROUTINE W REFLEX MICROSCOPIC
BILIRUBIN URINE: NEGATIVE
Glucose, UA: NEGATIVE mg/dL
Ketones, ur: NEGATIVE mg/dL
Leukocytes, UA: NEGATIVE
NITRITE: NEGATIVE
PH: 6.5 (ref 5.0–8.0)
Protein, ur: NEGATIVE mg/dL
SPECIFIC GRAVITY, URINE: 1.01 (ref 1.005–1.030)

## 2015-08-27 LAB — LIPASE, BLOOD: LIPASE: 28 U/L (ref 11–51)

## 2015-08-27 LAB — CBC WITH DIFFERENTIAL/PLATELET
BASOS ABS: 0 10*3/uL (ref 0.0–0.1)
Basophils Relative: 0 %
EOS PCT: 1 %
Eosinophils Absolute: 0.1 10*3/uL (ref 0.0–0.7)
HCT: 45.9 % (ref 39.0–52.0)
Hemoglobin: 16.2 g/dL (ref 13.0–17.0)
LYMPHS PCT: 23 %
Lymphs Abs: 2.9 10*3/uL (ref 0.7–4.0)
MCH: 33.3 pg (ref 26.0–34.0)
MCHC: 35.3 g/dL (ref 30.0–36.0)
MCV: 94.4 fL (ref 78.0–100.0)
MONO ABS: 0.8 10*3/uL (ref 0.1–1.0)
Monocytes Relative: 7 %
Neutro Abs: 8.4 10*3/uL — ABNORMAL HIGH (ref 1.7–7.7)
Neutrophils Relative %: 69 %
PLATELETS: 288 10*3/uL (ref 150–400)
RBC: 4.86 MIL/uL (ref 4.22–5.81)
RDW: 12.7 % (ref 11.5–15.5)
WBC: 12.2 10*3/uL — ABNORMAL HIGH (ref 4.0–10.5)

## 2015-08-27 LAB — URINE MICROSCOPIC-ADD ON: WBC UA: NONE SEEN WBC/hpf (ref 0–5)

## 2015-08-27 LAB — PROTIME-INR
INR: 1 (ref 0.00–1.49)
Prothrombin Time: 13.4 seconds (ref 11.6–15.2)

## 2015-08-27 MED ORDER — ONDANSETRON 4 MG PO TBDP
4.0000 mg | ORAL_TABLET | Freq: Once | ORAL | Status: AC
  Administered 2015-08-27: 4 mg via ORAL
  Filled 2015-08-27: qty 1

## 2015-08-27 MED ORDER — ONDANSETRON 4 MG PO TBDP: 4.0000 mg | ORAL_TABLET | ORAL | Status: DC | PRN

## 2015-08-27 MED ORDER — ACETAMINOPHEN 500 MG PO TABS
1000.0000 mg | ORAL_TABLET | Freq: Once | ORAL | Status: AC
  Administered 2015-08-27: 1000 mg via ORAL
  Filled 2015-08-27: qty 2

## 2015-08-27 MED ORDER — ACETAMINOPHEN 500 MG PO TABS: 1000.0000 mg | ORAL_TABLET | Freq: Four times a day (QID) | ORAL | Status: DC | PRN

## 2015-08-27 MED ORDER — ORPHENADRINE CITRATE ER 100 MG PO TB12: 100.0000 mg | ORAL_TABLET | Freq: Two times a day (BID) | ORAL | Status: DC

## 2015-08-27 NOTE — ED Notes (Addendum)
Dysuria, hematuria, urgency and scanty urine since yesterday. States he has been passing out for the past 3 days. Pt drove himself here. He is alert and oriented.

## 2015-08-27 NOTE — ED Provider Notes (Addendum)
CSN: [id number]     Arrival date & time 08/27/15  4098 History  By signing my name below, I, Bethel Born, attest that this documentation has been prepared under the direction and in the presence of Arby Barrette, MD. Electronically Signed: Bethel Born, ED Scribe. 08/27/2015. 9:57 PM  Chief Complaint  Patient presents with  . Dysuria  . Loss of Consciousness   The history is provided by the patient. No language interpreter was used.   RASAAN BROTHERTON is a 40 y.o. male with history of prostatitis who presents to the Emergency Department complaining of new and constant dysuria with onset yesterday. Pt states that he has burning pain with urination.  Associated symptoms include urgency, hematuria,  bilateral flank pain, and nausea. Pt denies fever, penile discharge, vomiting, focal weakness, numbness, and LE swelling or pain. Additionally pt states that he has had multiple syncopal episodes over the last 3 days. Prior to the episodes he has pain at the top of the head but no chest pain or dizziness. He has no known history of UTI.   Past Medical History  Diagnosis Date  . Prostatitis   . Reflux   . History of hematuria    Past Surgical History  Procedure Laterality Date  . Tonsillectomy    . Adenoidectomy    . Knee surgery    . Wisdom tooth extraction    . Ureter surgery     No family history on file. Social History  Substance Use Topics  . Smoking status: Current Every Day Smoker -- 0.50 packs/day for 15 years    Types: Cigarettes  . Smokeless tobacco: None  . Alcohol Use: No    Review of Systems  Constitutional: Negative for fever.  Cardiovascular: Negative for leg swelling.  Gastrointestinal: Positive for nausea. Negative for vomiting.  Genitourinary: Positive for dysuria, urgency, hematuria and flank pain. Negative for discharge.  Neurological: Positive for syncope and headaches. Negative for numbness (focal).  All other systems reviewed and are negative.  Allergies   Clindamycin/lincomycin; Penicillins; Pineapple; and Toradol  Home Medications   Prior to Admission medications   Medication Sig Start Date End Date Taking? Authorizing Provider  acetaminophen (TYLENOL) 500 MG tablet Take 2 tablets (1,000 mg total) by mouth every 6 (six) hours as needed. 08/27/15   Arby Barrette, MD  cephALEXin (KEFLEX) 500 MG capsule 2 caps po bid x 7 days 01/05/14   Mercedes Camprubi-Soms, PA-C  ciprofloxacin (CIPRO) 500 MG tablet Take 1 tablet (500 mg total) by mouth 2 (two) times daily. 04/29/15   Raeford Razor, MD  cyclobenzaprine (FLEXERIL) 10 MG tablet Take 1 tablet (10 mg total) by mouth 2 (two) times daily as needed for muscle spasms. 02/03/15   Gwyneth Sprout, MD  diphenoxylate-atropine (LOMOTIL) 2.5-0.025 MG per tablet Take 1 tablet by mouth 4 (four) times daily as needed for diarrhea or loose stools. 05/29/14   Burgess Amor, PA-C  HYDROcodone-acetaminophen (NORCO/VICODIN) 5-325 MG per tablet Take 1-2 tablets by mouth every 4 (four) hours as needed. 09/24/14   Rolan Bucco, MD  ibuprofen (ADVIL,MOTRIN) 600 MG tablet Take 1 tablet (600 mg total) by mouth every 8 (eight) hours as needed. 07/07/14   Burgess Amor, PA-C  levofloxacin (LEVAQUIN) 500 MG tablet Take 1 tablet (500 mg total) by mouth daily. 05/08/14   Roxy Horseman, PA-C  naproxen (NAPROSYN) 500 MG tablet Take 1 tablet (500 mg total) by mouth 2 (two) times daily. 02/03/15   Gwyneth Sprout, MD  ondansetron (ZOFRAN ODT) 4 MG  disintegrating tablet Take 1 tablet (4 mg total) by mouth every 4 (four) hours as needed for nausea or vomiting. 08/27/15   Arby BarretteMarcy Haidan Nhan, MD  orphenadrine (NORFLEX) 100 MG tablet Take 1 tablet (100 mg total) by mouth 2 (two) times daily. 08/27/15   Arby BarretteMarcy Neythan Kozlov, MD  oxyCODONE-acetaminophen (PERCOCET/ROXICET) 5-325 MG tablet Take 1 tablet by mouth every 6 (six) hours as needed for severe pain. 07/02/15   Ace GinsSerena Y Sam, PA-C  promethazine (PHENERGAN) 25 MG tablet Take 1 tablet (25 mg total) by mouth  every 6 (six) hours as needed for nausea or vomiting. 04/29/15   Raeford RazorStephen Kohut, MD  sulfamethoxazole-trimethoprim (SEPTRA DS) 800-160 MG per tablet Take 1 tablet by mouth every 12 (twelve) hours. 05/13/14   Burgess AmorJulie Idol, PA-C  traMADol (ULTRAM) 50 MG tablet Take 1 tablet (50 mg total) by mouth every 6 (six) hours as needed. 04/29/15   Raeford RazorStephen Kohut, MD   BP 133/90 mmHg  Pulse 105  Temp(Src) 98.6 F (37 C) (Oral)  Resp 20  Ht 5\' 10"  (1.778 m)  Wt 210 lb (95.255 kg)  BMI 30.13 kg/m2  SpO2 100% Physical Exam  Constitutional: He is oriented to person, place, and time. He appears well-developed and well-nourished.  HENT:  Head: Normocephalic and atraumatic.  Nose: Nose normal.  Mouth/Throat: Oropharynx is clear and moist. No oropharyngeal exudate.  Bilateral TMs normal  Eyes: EOM are normal. Pupils are equal, round, and reactive to light.  Neck: Normal range of motion. Neck supple.  Cardiovascular: Normal rate, regular rhythm, normal heart sounds and intact distal pulses.   Pulmonary/Chest: Effort normal and breath sounds normal. No respiratory distress.  Abdominal: Soft. He exhibits no distension. There is no tenderness.  Musculoskeletal: Normal range of motion. He exhibits no edema or tenderness.  Neurological: He is alert and oriented to person, place, and time. No cranial nerve deficit. He exhibits normal muscle tone. Coordination normal.  Skin: Skin is warm and dry.  Psychiatric: He has a normal mood and affect. Judgment normal.  Nursing note and vitals reviewed.   ED Course  Procedures (including critical care time) DIAGNOSTIC STUDIES: Oxygen Saturation is 100% on RA,  normal by my interpretation.    COORDINATION OF CARE: 9:54 PM Discussed treatment plan which includes lab work, CT head, and CT renal stone with pt at bedside and pt agreed to plan.  Labs Review Labs Reviewed  URINALYSIS, ROUTINE W REFLEX MICROSCOPIC ([facility]) - Abnormal; Notable for the following:    Hgb  urine dipstick MODERATE (*)    All other components within normal limits  URINE MICROSCOPIC-ADD ON - Abnormal; Notable for the following:    Squamous Epithelial / LPF 0-5 (*)    Bacteria, UA RARE (*)    All other components within normal limits  COMPREHENSIVE METABOLIC PANEL - Abnormal; Notable for the following:    Glucose, Bld 123 (*)    All other components within normal limits  CBC WITH DIFFERENTIAL/PLATELET - Abnormal; Notable for the following:    WBC 12.2 (*)    Neutro Abs 8.4 (*)    All other components within normal limits  LIPASE, BLOOD  PROTIME-INR  URINE RAPID DRUG SCREEN, HOSP PERFORMED    Imaging Review Ct Head Wo Contrast  08/27/2015  CLINICAL DATA:  Patient has been passing out over the last 2-3 days. Not sure if hit head. EXAM: CT HEAD WITHOUT CONTRAST TECHNIQUE: Contiguous axial images were obtained from the base of the skull through the vertex without intravenous  contrast. COMPARISON:  07/01/2012 FINDINGS: Ventricles and sulci are symmetrical. No ventricular dilatation peer No mass effect or midline shift. No abnormal extra-axial fluid collections. Gray-white matter junctions are distinct. Basal cisterns are not effaced. No evidence of acute intracranial hemorrhage. No depressed skull fractures. Visualized paranasal sinuses and mastoid air cells are not opacified. IMPRESSION: No acute intracranial abnormalities. Electronically Signed   By: Burman Nieves M.D.   On: 08/27/2015 22:35   Ct Renal Stone Study  08/27/2015  CLINICAL DATA:  Dysuria, hematuria, urgency, and decreased urine since yesterday. Flank pain. EXAM: CT ABDOMEN AND PELVIS WITHOUT CONTRAST TECHNIQUE: Multidetector CT imaging of the abdomen and pelvis was performed following the standard protocol without IV contrast. COMPARISON:  07/31/2015 FINDINGS: The lung bases are clear. The kidneys are symmetrical in size and shape. No hydronephrosis or hydroureter. No renal, ureteral, or bladder stones are demonstrated.  No bladder wall thickening. The unenhanced appearance of the liver, spleen, gallbladder, pancreas, adrenal glands, abdominal aorta, inferior vena cava, and retroperitoneal lymph nodes is unremarkable. Stomach, small bowel, and colon are not abnormally distended. No free air or free fluid in the abdomen. Abdominal wall musculature appears intact. Pelvis: Appendix is normal. Prostate gland is mildly enlarged at 3.8 cm diameter. No free or loculated pelvic fluid collections. No pelvic mass or lymphadenopathy. No destructive bone lesions. IMPRESSION: No renal or ureteral stone or obstruction. Electronically Signed   By: Burman Nieves M.D.   On: 08/27/2015 22:38   I have personally reviewed and evaluated these images and lab results as part of my medical decision-making.   EKG Interpretation   Date/Time:  Thursday August 27 2015 19:25:51 EDT Ventricular Rate:  102 PR Interval:  162 QRS Duration: 84 QT Interval:  316 QTC Calculation: 411 R Axis:   58 Text Interpretation:  Sinus tachycardia Possible Left atrial enlargement  Borderline ECG agree. no STEMI Confirmed by Donnald Garre, MD, Lebron Conners [phone] on  08/27/2015 8:09:01 PM      MDM   Final diagnoses:  Flank pain  Hematuria  Syncope and collapse   CT does not show stone or other acute pathology. Reviewed EMR indicates the patient has had recurrent hematuria and previously negative diagnostic evaluation for CT stone. At this time, this appears chronic in nature. A is otherwise well in appearance with stable vital signs, no fever no urinary leukocytosis. At this time I do not opts to empiracally treated with antibiotics as this has been ongoing and there are no other signs of immediate infection. Culture will be obtained. Regarding the patient's episodes of syncope as described. Does have normal neurologic examination CT head does not show intracranial anomaly. Patient describes posterior headache and then loss of consciousness. Consideration is given  to atypical seizure or migraine presentation. Patient is advised he cannot drive or do any dangerous activities until he is a follow-up with neurology. He voices understanding. Patient is discharged in good condition. Vital signs are stable, he is alert and well in appearance. He is advised to use acetaminophen for back pain and Zofran if needed for nausea.   Arby Barrette, MD 08/27/15 [id number]  Arby Barrette, MD 08/27/15 [phone]

## 2015-08-27 NOTE — Discharge Instructions (Signed)
Flank Pain Flank pain refers to pain that is located on the side of the body between the upper abdomen and the back. The pain may occur over a short period of time (acute) or may be long-term or reoccurring (chronic). It may be mild or severe. Flank pain can be caused by many things. CAUSES  Some of the more common causes of flank pain include:  Muscle strains.   Muscle spasms.   A disease of your spine (vertebral disk disease).   A lung infection (pneumonia).   Fluid around your lungs (pulmonary edema).   A kidney infection.   Kidney stones.   A very painful skin rash caused by the chickenpox virus (shingles).   Gallbladder disease.  HOME CARE INSTRUCTIONS  Home care will depend on the cause of your pain. In general,  Rest as directed by your caregiver.  Drink enough fluids to keep your urine clear or pale yellow.  Only take over-the-counter or prescription medicines as directed by your caregiver. Some medicines may help relieve the pain.  Tell your caregiver about any changes in your pain.  Follow up with your caregiver as directed. SEEK IMMEDIATE MEDICAL CARE IF:   Your pain is not controlled with medicine.   You have new or worsening symptoms.  Your pain increases.   You have abdominal pain.   You have shortness of breath.   You have persistent nausea or vomiting.   You have swelling in your abdomen.   You feel faint or pass out.   You have blood in your urine.  You have a fever or persistent symptoms for more than 2-3 days.  You have a fever and your symptoms suddenly get worse. MAKE SURE YOU:   Understand these instructions.  Will watch your condition.  Will get help right away if you are not doing well or get worse.   This information is not intended to replace advice given to you by your health care provider. Make sure you discuss any questions you have with your health care provider.   Document Released: 07/14/2005 Document  Revised: 02/15/2012 Document Reviewed: 01/05/2012 Elsevier Interactive Patient Education [employer]2016 [employer] Syncope At this time, the cause of your passing out episodes is unclear. You however may not drive your car into the you have been seen by a neurologist and your family doctor. These episodes may represent seizure episodes and thus he will require clearance by a neurologist before you are able to drive again. Syncope is a medical term for fainting or passing out. This means you lose consciousness and drop to the ground. People are generally unconscious for less than 5 minutes. You may have some muscle twitches for up to 15 seconds before waking up and returning to normal. Syncope occurs more often in older adults, but it can happen to anyone. While most causes of syncope are not dangerous, syncope can be a sign of a serious medical problem. It is important to seek medical care.  CAUSES  Syncope is caused by a sudden drop in blood flow to the brain. The specific cause is often not determined. Factors that can bring on syncope include:  Taking medicines that lower blood pressure.  Sudden changes in posture, such as standing up quickly.  Taking more medicine than prescribed.  Standing in one place for too long.  Seizure disorders.  Dehydration and excessive exposure to heat.  Low blood sugar (hypoglycemia).  Straining to have a bowel movement.  Heart disease, irregular heartbeat, or other  circulatory problems.  Fear, emotional distress, seeing blood, or severe pain. SYMPTOMS  Right before fainting, you may:  Feel dizzy or light-headed.  Feel nauseous.  See all white or all black in your field of vision.  Have cold, clammy skin. DIAGNOSIS  Your health care provider will ask about your symptoms, perform a physical exam, and perform an electrocardiogram (ECG) to record the electrical activity of your heart. Your health care provider may also perform other heart or blood tests  to determine the cause of your syncope which may include:  Transthoracic echocardiogram (TTE). During echocardiography, sound waves are used to evaluate how blood flows through your heart.  Transesophageal echocardiogram (TEE).  Cardiac monitoring. This allows your health care provider to monitor your heart rate and rhythm in real time.  Holter monitor. This is a portable device that records your heartbeat and can help diagnose heart arrhythmias. It allows your health care provider to track your heart activity for several days, if needed.  Stress tests by exercise or by giving medicine that makes the heart beat faster. TREATMENT  In most cases, no treatment is needed. Depending on the cause of your syncope, your health care provider may recommend changing or stopping some of your medicines. HOME CARE INSTRUCTIONS  Have someone stay with you until you feel stable.  Do not drive, use machinery, or play sports until your health care provider says it is okay.  Keep all follow-up appointments as directed by your health care provider.  Lie down right away if you start feeling like you might faint. Breathe deeply and steadily. Wait until all the symptoms have passed.  Drink enough fluids to keep your urine clear or pale yellow.  If you are taking blood pressure or heart medicine, get up slowly and take several minutes to sit and then stand. This can reduce dizziness. SEEK IMMEDIATE MEDICAL CARE IF:   You have a severe headache.  You have unusual pain in the chest, abdomen, or back.  You are bleeding from your mouth or rectum, or you have black or tarry stool.  You have an irregular or very fast heartbeat.  You have pain with breathing.  You have repeated fainting or seizure-like jerking during an episode.  You faint when sitting or lying down.  You have confusion.  You have trouble walking.  You have severe weakness.  You have vision problems. If you fainted, call your local  emergency services (911 in [place] Do not drive yourself to the hospital.    This information is not intended to replace advice given to you by your health care provider. Make sure you discuss any questions you have with your health care provider.   Document Released: 05/23/2005 Document Revised: 10/07/2014 Document Reviewed: 07/22/2011 Elsevier Interactive Patient Education [employer]

## 2015-08-29 LAB — URINE CULTURE: Culture: 2000

## 2015-10-14 ENCOUNTER — Emergency Department (HOSPITAL_BASED_OUTPATIENT_CLINIC_OR_DEPARTMENT_OTHER)
Admission: EM | Admit: 2015-10-14 | Discharge: 2015-10-14 | Disposition: A | Discharge status: Home / Self Care | Payer: BLUE CROSS/BLUE SHIELD | Attending: Emergency Medicine | Admitting: Emergency Medicine

## 2015-10-14 ENCOUNTER — Encounter (HOSPITAL_BASED_OUTPATIENT_CLINIC_OR_DEPARTMENT_OTHER): Payer: Self-pay | Admitting: Emergency Medicine

## 2015-10-14 DIAGNOSIS — Z4800 Encounter for change or removal of nonsurgical wound dressing: Secondary | ICD-10-CM | POA: Diagnosis present

## 2015-10-14 DIAGNOSIS — F17210 Nicotine dependence, cigarettes, uncomplicated: Secondary | ICD-10-CM | POA: Diagnosis not present

## 2015-10-14 DIAGNOSIS — Z5189 Encounter for other specified aftercare: Secondary | ICD-10-CM

## 2015-10-14 NOTE — ED Notes (Signed)
Sliced off the tip of right 4th finger 1 week ago.  Saw pmd and is taking bactrim.  Sts he is having increased pain, vomiting and diarrhea x5 days.

## 2015-10-14 NOTE — ED Provider Notes (Signed)
CSN: [id number]     Arrival date & time 10/14/15  1700 History   First MD Initiated Contact with Patient 10/14/15 1732     Chief Complaint  Patient presents with  . Wound Check     (Consider location/radiation/quality/duration/timing/severity/associated sxs/prior Treatment) HPI Terrance Hodges is a 40 y.o. male here for a wound check. Patient reports one week ago he cut the tip of his right fourth finger. He is followed by his primary care doctor. He is placed on Bactrim for this. He reports mild nausea, vomiting and diarrhea since he started his Bactrim. No other fevers or abdominal pain. He reports pain in his fourth finger but no redness or swelling or decreased range of motion. He does report intermittent drainage from his wound. No other modifying factors.  Past Medical History  Diagnosis Date  . Prostatitis   . Reflux   . History of hematuria    Past Surgical History  Procedure Laterality Date  . Tonsillectomy    . Adenoidectomy    . Knee surgery    . Wisdom tooth extraction    . Ureter surgery     No family history on file. Social History  Substance Use Topics  . Smoking status: Current Every Day Smoker -- 0.50 packs/day for 15 years    Types: Cigarettes  . Smokeless tobacco: None  . Alcohol Use: No    Review of Systems A 10 point review of systems was completed and was negative except for pertinent positives and negatives as mentioned in the history of present illness     Allergies  Clindamycin/lincomycin; Penicillins; Pineapple; and Toradol  Home Medications   Prior to Admission medications   Medication Sig Start Date End Date Taking? Authorizing Provider  acetaminophen (TYLENOL) 500 MG tablet Take 2 tablets (1,000 mg total) by mouth every 6 (six) hours as needed. 08/27/15   Arby Barrette, MD  cephALEXin (KEFLEX) 500 MG capsule 2 caps po bid x 7 days 01/05/14   Mercedes Camprubi-Soms, PA-C  ciprofloxacin (CIPRO) 500 MG tablet Take 1 tablet (500 mg total) by mouth  2 (two) times daily. 04/29/15   Raeford Razor, MD  cyclobenzaprine (FLEXERIL) 10 MG tablet Take 1 tablet (10 mg total) by mouth 2 (two) times daily as needed for muscle spasms. 02/03/15   Gwyneth Sprout, MD  diphenoxylate-atropine (LOMOTIL) 2.5-0.025 MG per tablet Take 1 tablet by mouth 4 (four) times daily as needed for diarrhea or loose stools. 05/29/14   Burgess Amor, PA-C  HYDROcodone-acetaminophen (NORCO/VICODIN) 5-325 MG per tablet Take 1-2 tablets by mouth every 4 (four) hours as needed. 09/24/14   Rolan Bucco, MD  ibuprofen (ADVIL,MOTRIN) 600 MG tablet Take 1 tablet (600 mg total) by mouth every 8 (eight) hours as needed. 07/07/14   Burgess Amor, PA-C  levofloxacin (LEVAQUIN) 500 MG tablet Take 1 tablet (500 mg total) by mouth daily. 05/08/14   Roxy Horseman, PA-C  naproxen (NAPROSYN) 500 MG tablet Take 1 tablet (500 mg total) by mouth 2 (two) times daily. 02/03/15   Gwyneth Sprout, MD  ondansetron (ZOFRAN ODT) 4 MG disintegrating tablet Take 1 tablet (4 mg total) by mouth every 4 (four) hours as needed for nausea or vomiting. 08/27/15   Arby Barrette, MD  orphenadrine (NORFLEX) 100 MG tablet Take 1 tablet (100 mg total) by mouth 2 (two) times daily. 08/27/15   Arby Barrette, MD  oxyCODONE-acetaminophen (PERCOCET/ROXICET) 5-325 MG tablet Take 1 tablet by mouth every 6 (six) hours as needed for severe pain. 07/02/15  Ace GinsSerena Y Sam, PA-C  promethazine (PHENERGAN) 25 MG tablet Take 1 tablet (25 mg total) by mouth every 6 (six) hours as needed for nausea or vomiting. 04/29/15   Raeford RazorStephen Kohut, MD  sulfamethoxazole-trimethoprim (SEPTRA DS) 800-160 MG per tablet Take 1 tablet by mouth every 12 (twelve) hours. 05/13/14   Burgess AmorJulie Idol, PA-C  traMADol (ULTRAM) 50 MG tablet Take 1 tablet (50 mg total) by mouth every 6 (six) hours as needed. 04/29/15   Raeford RazorStephen Kohut, MD   BP 118/67 mmHg  Pulse 99  Temp(Src) 98.9 F (37.2 C) (Oral)  Resp 16  Ht 5\' 10"  (1.778 m)  Wt 97.523 kg  BMI 30.85 kg/m2  SpO2  99% Physical Exam  Constitutional:  Awake, alert, nontoxic appearance.  HENT:  Head: Atraumatic.  Eyes: Right eye exhibits no discharge. Left eye exhibits no discharge.  Neck: Neck supple.  Pulmonary/Chest: Effort normal. He exhibits no tenderness.  Abdominal: Soft. There is no tenderness. There is no rebound.  Musculoskeletal: He exhibits no tenderness.  Baseline ROM, no obvious new focal weakness.  Neurological:  Mental status and motor strength appears baseline for patient and situation.  Skin: No rash noted.  Laceration wound to distal tip of fourth finger on left hand appears to be healing well. No erythema, purulent drainage, swelling. Maintains full active range of motion of finger. No abnormal findings.  Psychiatric: He has a normal mood and affect.  Nursing note and vitals reviewed.   ED Course  Procedures (including critical care time) Labs Review Labs Reviewed - No data to display  Imaging Review No results found. I have personally reviewed and evaluated these images and lab results as part of my medical decision-making.   EKG Interpretation None      MDM  Terrance MesaJamie E Divelbiss is a 40 y.o. male . No evidence of cellulitis or other infection. Encouraged continued use of previously prescribed Bactrim and follow up with PCP for regularly scheduled appointment on Saturday. Suspect mild symptoms of nausea, vomiting and diarrhea are secondary side effects to Bactrim. Low suspicion for abdominal pathology. Discussed strict return precautions. Final diagnoses:  Visit for wound check       Terrance PeekBenjamin Benedicto Capozzi, PA-C 10/14/15 1823  Terrance Guiseana Duo Liu, MD 10/15/15 [phone]

## 2015-11-19 ENCOUNTER — Encounter (HOSPITAL_BASED_OUTPATIENT_CLINIC_OR_DEPARTMENT_OTHER): Payer: Self-pay

## 2015-11-19 ENCOUNTER — Emergency Department (HOSPITAL_BASED_OUTPATIENT_CLINIC_OR_DEPARTMENT_OTHER)
Admission: EM | Admit: 2015-11-19 | Discharge: 2015-11-19 | Disposition: A | Discharge status: Home / Self Care | Payer: BLUE CROSS/BLUE SHIELD | Attending: Emergency Medicine | Admitting: Emergency Medicine

## 2015-11-19 ENCOUNTER — Emergency Department (HOSPITAL_BASED_OUTPATIENT_CLINIC_OR_DEPARTMENT_OTHER): Payer: BLUE CROSS/BLUE SHIELD

## 2015-11-19 DIAGNOSIS — R300 Dysuria: Secondary | ICD-10-CM | POA: Diagnosis present

## 2015-11-19 DIAGNOSIS — N410 Acute prostatitis: Secondary | ICD-10-CM | POA: Insufficient documentation

## 2015-11-19 DIAGNOSIS — F17210 Nicotine dependence, cigarettes, uncomplicated: Secondary | ICD-10-CM | POA: Diagnosis not present

## 2015-11-19 DIAGNOSIS — R109 Unspecified abdominal pain: Secondary | ICD-10-CM | POA: Diagnosis not present

## 2015-11-19 LAB — BASIC METABOLIC PANEL
Anion gap: 9 (ref 5–15)
BUN: 10 mg/dL (ref 6–20)
CHLORIDE: 103 mmol/L (ref 101–111)
CO2: 24 mmol/L (ref 22–32)
Calcium: 9 mg/dL (ref 8.9–10.3)
Creatinine, Ser: 0.91 mg/dL (ref 0.61–1.24)
GFR calc Af Amer: >60 mL/min (ref >60)
GFR calc non Af Amer: >60 mL/min (ref >60)
GLUCOSE: 250 mg/dL — AB (ref 65–99)
POTASSIUM: 3.3 mmol/L — AB (ref 3.5–5.1)
SODIUM: 136 mmol/L (ref 135–145)

## 2015-11-19 LAB — URINALYSIS, ROUTINE W REFLEX MICROSCOPIC
Bilirubin Urine: NEGATIVE
Glucose, UA: >1000 mg/dL — AB
Ketones, ur: 15 mg/dL — AB
Leukocytes, UA: NEGATIVE
NITRITE: NEGATIVE
PH: 5.5 (ref 5.0–8.0)
Protein, ur: NEGATIVE mg/dL
SPECIFIC GRAVITY, URINE: 1.027 (ref 1.005–1.030)

## 2015-11-19 LAB — CBC WITH DIFFERENTIAL/PLATELET
Basophils Absolute: 0 10*3/uL (ref 0.0–0.1)
Basophils Relative: 0 %
Eosinophils Absolute: 0.1 10*3/uL (ref 0.0–0.7)
Eosinophils Relative: 1 %
HEMATOCRIT: 43.9 % (ref 39.0–52.0)
HEMOGLOBIN: 15.7 g/dL (ref 13.0–17.0)
LYMPHS ABS: 2.3 10*3/uL (ref 0.7–4.0)
LYMPHS PCT: 18 %
MCH: 33.5 pg (ref 26.0–34.0)
MCHC: 35.8 g/dL (ref 30.0–36.0)
MCV: 93.6 fL (ref 78.0–100.0)
MONOS PCT: 5 %
Monocytes Absolute: 0.6 10*3/uL (ref 0.1–1.0)
NEUTROS ABS: 9.6 10*3/uL — AB (ref 1.7–7.7)
NEUTROS PCT: 76 %
Platelets: 295 10*3/uL (ref 150–400)
RBC: 4.69 MIL/uL (ref 4.22–5.81)
RDW: 12.8 % (ref 11.5–15.5)
WBC: 12.6 10*3/uL — AB (ref 4.0–10.5)

## 2015-11-19 LAB — URINE MICROSCOPIC-ADD ON

## 2015-11-19 MED ORDER — CIPROFLOXACIN HCL 500 MG PO TABS
500.0000 mg | ORAL_TABLET | Freq: Once | ORAL | Status: AC
  Administered 2015-11-19: 500 mg via ORAL
  Filled 2015-11-19: qty 1

## 2015-11-19 MED ORDER — HYDROMORPHONE HCL 1 MG/ML IJ SOLN
1.0000 mg | Freq: Once | INTRAMUSCULAR | Status: AC
  Administered 2015-11-19: 1 mg via INTRAVENOUS
  Filled 2015-11-19: qty 1

## 2015-11-19 MED ORDER — SODIUM CHLORIDE 0.9 % IV SOLN
Freq: Once | INTRAVENOUS | Status: AC
  Administered 2015-11-19: 10:00:00 via INTRAVENOUS

## 2015-11-19 MED ORDER — HYDROCODONE-ACETAMINOPHEN 5-325 MG PO TABS
2.0000 | ORAL_TABLET | Freq: Once | ORAL | Status: AC
  Administered 2015-11-19: 2 via ORAL
  Filled 2015-11-19: qty 2

## 2015-11-19 MED ORDER — HYDROCODONE-ACETAMINOPHEN 5-325 MG PO TABS: 2.0000 | ORAL_TABLET | ORAL | Status: DC | PRN

## 2015-11-19 MED ORDER — ONDANSETRON HCL 4 MG/2ML IJ SOLN
4.0000 mg | Freq: Once | INTRAMUSCULAR | Status: AC
  Administered 2015-11-19: 4 mg via INTRAVENOUS
  Filled 2015-11-19: qty 2

## 2015-11-19 MED ORDER — CIPROFLOXACIN HCL 500 MG PO TABS: 500.0000 mg | ORAL_TABLET | Freq: Two times a day (BID) | ORAL | Status: DC

## 2015-11-19 MED FILL — CIPROFLOXACIN HCL 500 MG TA: 500 | 14 days supply | Qty: 28 | Fill #0

## 2015-11-19 NOTE — ED Notes (Signed)
Patient complains of dysuria and frequency x 4 days with back pain and flank pain

## 2015-11-19 NOTE — ED Notes (Signed)
Returned from CT.

## 2015-11-19 NOTE — Discharge Instructions (Signed)

## 2015-11-19 NOTE — ED Provider Notes (Signed)
CSN: [id number]     Arrival date & time 11/19/15  0908 History   First MD Initiated Contact with Patient 11/19/15 [phone]     Chief Complaint  Patient presents with  . Dysuria     (Consider location/radiation/quality/duration/timing/severity/associated sxs/prior Treatment) Patient is a 40 y.o. male presenting with frequency. The history is provided by the patient. No language interpreter was used.  Urinary Frequency This is a new problem. The current episode started in the past 7 days. The problem occurs constantly. The problem has been gradually worsening. Associated symptoms include abdominal pain. Nothing aggravates the symptoms. He has tried nothing for the symptoms. The treatment provided no relief.  Pt report he thinks he has a urinary tract infection  Past Medical History  Diagnosis Date  . Prostatitis   . Reflux   . History of hematuria    Past Surgical History  Procedure Laterality Date  . Tonsillectomy    . Adenoidectomy    . Knee surgery    . Wisdom tooth extraction    . Ureter surgery     No family history on file. Social History  Substance Use Topics  . Smoking status: Current Every Day Smoker -- 0.50 packs/day for 15 years    Types: Cigarettes  . Smokeless tobacco: None  . Alcohol Use: No    Review of Systems  Gastrointestinal: Positive for abdominal pain.  Genitourinary: Positive for frequency.  All other systems reviewed and are negative.     Allergies  Clindamycin/lincomycin; Penicillins; Pineapple; and Toradol  Home Medications   Prior to Admission medications   Medication Sig Start Date End Date Taking? Authorizing Provider  ciprofloxacin (CIPRO) 500 MG tablet Take 1 tablet (500 mg total) by mouth 2 (two) times daily. 11/19/15   Elson Areas, PA-C  HYDROcodone-acetaminophen (NORCO/VICODIN) 5-325 MG tablet Take 2 tablets by mouth every 4 (four) hours as needed. 11/19/15   Elson Areas, PA-C  oxyCODONE-acetaminophen (PERCOCET/ROXICET) 5-325 MG  tablet Take 1 tablet by mouth every 6 (six) hours as needed for severe pain. 07/02/15   Ace Gins Sam, PA-C   BP 135/80 mmHg  Pulse 84  Temp(Src) 98.3 F (36.8 C) (Oral)  Resp 18  Ht  (1.778 m)  Wt 93.895 kg  BMI 29.70 kg/m2  SpO2 98% Physical Exam  Constitutional: He is oriented to person, place, and time. He appears well-developed and well-nourished.  HENT:  Head: Normocephalic.  Eyes: EOM are normal.  Neck: Normal range of motion.  Pulmonary/Chest: Effort normal.  Abdominal: He exhibits no distension.  Genitourinary: Rectum normal, prostate normal and penis normal. No penile tenderness.  Minimally tender prostate  Musculoskeletal: Normal range of motion.  Neurological: He is alert and oriented to person, place, and time.  Psychiatric: He has a normal mood and affect.  Nursing note and vitals reviewed.   ED Course  Procedures (including critical care time) Labs Review Labs Reviewed  URINALYSIS, ROUTINE W REFLEX MICROSCOPIC (NOT AT [facility]) - Abnormal; Notable for the following:    Glucose, UA >1000 (*)    Hgb urine dipstick LARGE (*)    Ketones, ur 15 (*)    All other components within normal limits  CBC WITH DIFFERENTIAL/PLATELET - Abnormal; Notable for the following:    WBC 12.6 (*)    Neutro Abs 9.6 (*)    All other components within normal limits  BASIC METABOLIC PANEL - Abnormal; Notable for the following:    Potassium 3.3 (*)    Glucose, Bld 250 (*)  All other components within normal limits  URINE MICROSCOPIC-ADD ON - Abnormal; Notable for the following:    Squamous Epithelial / LPF 0-5 (*)    Bacteria, UA FEW (*)    All other components within normal limits    Imaging Review Ct Renal Stone Study  11/19/2015  CLINICAL DATA:  Bilateral flank pain for 6 days, nausea, 1 episode of vomiting, greater left flank pain EXAM: CT ABDOMEN AND PELVIS WITHOUT CONTRAST TECHNIQUE: Multidetector CT imaging of the abdomen and pelvis was performed following the standard  protocol without IV contrast. COMPARISON:  08/27/2015 FINDINGS: Lower chest:  The lung bases are unremarkable. Hepatobiliary: Unenhanced liver shows no biliary ductal dilatation. The gallbladder is contracted without evidence of calcified gallstones. Pancreas: Unenhanced pancreas is unremarkable. Spleen: Unenhanced spleen is unremarkable. Adrenals/Urinary Tract: Stable mild thickening of left adrenal gland. No adrenal gland mass is noted. Unenhanced kidneys are symmetrical in size. No nephrolithiasis. No hydronephrosis or hydroureter. No calcified ureteral calculi. No calcified calculi are noted within urinary bladder. Stomach/Bowel: There is no small bowel obstruction. No thickened or dilated small bowel loops. The terminal ileum is unremarkable. Normal appendix is noted in axial image 67. No pericecal inflammation. Some colonic gas noted in transverse colon. Moderate stool noted within proximal sigmoid colon. No evidence of colitis or diverticulitis. No distal colonic obstruction. Vascular/Lymphatic: No aortic aneurysm. No retroperitoneal or mesenteric adenopathy. Reproductive: No pelvic mass. Prostate gland and seminal vesicles are unremarkable. Other: No ascites or free abdominal air. Musculoskeletal: No destructive bony lesions are noted. Sagittal images of the spine are unremarkable. IMPRESSION: 1. There is no evidence of nephrolithiasis. No hydronephrosis or hydroureter. 2. No calcified ureteral calculi are noted. 3. No calcified calculi are noted within urinary bladder. 4. Normal appendix.  No pericecal inflammation. 5. No small bowel obstruction. Electronically Signed   By: Natasha Mead M.D.   On: 11/19/2015 10:47   I have personally reviewed and evaluated these images and lab results as part of my medical decision-making.   EKG Interpretation None      MDM reviewing old labs pt has hematuria on all urin specimens.  Multiple negative ct's   Ct today is negative.     Final diagnoses:  Acute  prostatitis     Meds ordered this encounter  Medications  . 0.9 %  sodium chloride infusion    Sig:   . HYDROmorphone (DILAUDID) injection 1 mg    Sig:   . ondansetron (ZOFRAN) injection 4 mg    Sig:   . ciprofloxacin (CIPRO) tablet 500 mg    Sig:   . HYDROcodone-acetaminophen (NORCO/VICODIN) 5-325 MG per tablet 2 tablet    Sig:   . ciprofloxacin (CIPRO) 500 MG tablet    Sig: Take 1 tablet (500 mg total) by mouth 2 (two) times daily.    Dispense:  28 tablet    Refill:  0    Order Specific Question:  Supervising Provider    Answer:  MILLER, BRIAN [[id number]]  . HYDROcodone-acetaminophen (NORCO/VICODIN) 5-325 MG tablet    Sig: Take 2 tablets by mouth every 4 (four) hours as needed.    Dispense:  20 tablet    Refill:  0    Order Specific Question:  Supervising Provider    Answer:  MILLER, BRIAN [[id number]]   I voided hydrocodone.   Pt has a pain management contract with [facility].  Pharmacy reports pt filled 120 percocet on 6/7.  Pt informed them he had stopped that medication and returned  to pharmacy.  (Pt needs to discuss pain medication with his MD)  Elson AreasLeslie K Kie Calvin, PA-C 11/19/15 1230  Gwyneth SproutWhitney Plunkett, MD 11/19/15 1503

## 2015-11-19 NOTE — ED Notes (Signed)
PA back at bedside with this RN for further assessment.

## 2015-11-24 ENCOUNTER — Encounter (HOSPITAL_BASED_OUTPATIENT_CLINIC_OR_DEPARTMENT_OTHER): Payer: Self-pay | Admitting: Emergency Medicine

## 2015-11-24 ENCOUNTER — Emergency Department (HOSPITAL_BASED_OUTPATIENT_CLINIC_OR_DEPARTMENT_OTHER)
Admission: EM | Admit: 2015-11-24 | Discharge: 2015-11-24 | Disposition: A | Discharge status: Home / Self Care | Payer: BLUE CROSS/BLUE SHIELD | Attending: Emergency Medicine | Admitting: Emergency Medicine

## 2015-11-24 ENCOUNTER — Emergency Department (HOSPITAL_BASED_OUTPATIENT_CLINIC_OR_DEPARTMENT_OTHER): Payer: BLUE CROSS/BLUE SHIELD

## 2015-11-24 DIAGNOSIS — F17210 Nicotine dependence, cigarettes, uncomplicated: Secondary | ICD-10-CM | POA: Insufficient documentation

## 2015-11-24 DIAGNOSIS — N410 Acute prostatitis: Secondary | ICD-10-CM | POA: Insufficient documentation

## 2015-11-24 DIAGNOSIS — R102 Pelvic and perineal pain: Secondary | ICD-10-CM | POA: Insufficient documentation

## 2015-11-24 LAB — COMPREHENSIVE METABOLIC PANEL
ALT: 46 U/L (ref 17–63)
AST: 25 U/L (ref 15–41)
Albumin: 4 g/dL (ref 3.5–5.0)
Alkaline Phosphatase: 58 U/L (ref 38–126)
Anion gap: 8 (ref 5–15)
BUN: 11 mg/dL (ref 6–20)
CO2: 24 mmol/L (ref 22–32)
Calcium: 8.7 mg/dL — ABNORMAL LOW (ref 8.9–10.3)
Chloride: 107 mmol/L (ref 101–111)
Creatinine, Ser: 0.87 mg/dL (ref 0.61–1.24)
GFR calc Af Amer: >60 mL/min (ref >60)
GFR calc non Af Amer: >60 mL/min (ref >60)
Glucose, Bld: 189 mg/dL — ABNORMAL HIGH (ref 65–99)
Potassium: 3.8 mmol/L (ref 3.5–5.1)
Sodium: 139 mmol/L (ref 135–145)
Total Bilirubin: 0.5 mg/dL (ref 0.3–1.2)
Total Protein: 6.7 g/dL (ref 6.5–8.1)

## 2015-11-24 LAB — CBC WITH DIFFERENTIAL/PLATELET
Basophils Absolute: 0 10*3/uL (ref 0.0–0.1)
Basophils Relative: 0 %
Eosinophils Absolute: 0.1 10*3/uL (ref 0.0–0.7)
Eosinophils Relative: 1 %
HCT: 40.7 % (ref 39.0–52.0)
Hemoglobin: 14.3 g/dL (ref 13.0–17.0)
Lymphocytes Relative: 23 %
Lymphs Abs: 2.4 10*3/uL (ref 0.7–4.0)
MCH: 33.5 pg (ref 26.0–34.0)
MCHC: 35.1 g/dL (ref 30.0–36.0)
MCV: 95.3 fL (ref 78.0–100.0)
Monocytes Absolute: 0.8 10*3/uL (ref 0.1–1.0)
Monocytes Relative: 7 %
Neutro Abs: 7.5 10*3/uL (ref 1.7–7.7)
Neutrophils Relative %: 69 %
Platelets: 247 10*3/uL (ref 150–400)
RBC: 4.27 MIL/uL (ref 4.22–5.81)
RDW: 12.9 % (ref 11.5–15.5)
WBC: 10.8 10*3/uL — ABNORMAL HIGH (ref 4.0–10.5)

## 2015-11-24 LAB — URINALYSIS, ROUTINE W REFLEX MICROSCOPIC
Bilirubin Urine: NEGATIVE
Glucose, UA: >1000 mg/dL — AB
Ketones, ur: NEGATIVE mg/dL
Leukocytes, UA: NEGATIVE
Nitrite: NEGATIVE
Protein, ur: NEGATIVE mg/dL
Specific Gravity, Urine: 1.026 (ref 1.005–1.030)
pH: 5.5 (ref 5.0–8.0)

## 2015-11-24 LAB — URINE MICROSCOPIC-ADD ON

## 2015-11-24 MED ORDER — SODIUM CHLORIDE 0.9 % IV BOLUS (SEPSIS)
1000.0000 mL | Freq: Once | INTRAVENOUS | Status: AC
  Administered 2015-11-24: 1000 mL via INTRAVENOUS

## 2015-11-24 MED ORDER — MORPHINE SULFATE (PF) 4 MG/ML IV SOLN
4.0000 mg | Freq: Once | INTRAVENOUS | Status: AC
Start: 2015-11-24 — End: 2015-11-24
  Administered 2015-11-24: 4 mg via INTRAVENOUS
  Filled 2015-11-24: qty 1

## 2015-11-24 MED ORDER — ONDANSETRON HCL 4 MG/2ML IJ SOLN
4.0000 mg | Freq: Once | INTRAMUSCULAR | Status: AC
  Administered 2015-11-24: 4 mg via INTRAVENOUS
  Filled 2015-11-24: qty 2

## 2015-11-24 MED ORDER — PROMETHAZINE HCL 25 MG PO TABS: 25.0000 mg | ORAL_TABLET | Freq: Three times a day (TID) | ORAL | Status: DC | PRN

## 2015-11-24 MED ORDER — OXYCODONE-ACETAMINOPHEN 5-325 MG PO TABS
2.0000 | ORAL_TABLET | Freq: Once | ORAL | Status: AC
  Administered 2015-11-24: 2 via ORAL
  Filled 2015-11-24: qty 2

## 2015-11-24 MED ORDER — PROMETHAZINE HCL 25 MG/ML IJ SOLN
25.0000 mg | Freq: Once | INTRAMUSCULAR | Status: AC
  Administered 2015-11-24: 25 mg via INTRAVENOUS
  Filled 2015-11-24: qty 1

## 2015-11-24 MED ORDER — TAMSULOSIN HCL 0.4 MG PO CAPS: 0.4000 mg | ORAL_CAPSULE | Freq: Every day | ORAL | Status: DC

## 2015-11-24 NOTE — ED Notes (Signed)
Seen 5 days ago for "prostatitis

## 2015-11-24 NOTE — ED Notes (Signed)
Pt verbalizes understanding of d/c instructions and denies any further needs at this time. 

## 2015-11-24 NOTE — Discharge Instructions (Signed)
Return here as needed.  Follow-up with the urologist provided. 

## 2015-11-24 NOTE — ED Provider Notes (Signed)
CSN: [id number]     Arrival date & time 11/24/15  1656 History   First MD Initiated Contact with Patient 11/24/15 1825     Chief Complaint  Patient presents with  . Prostatitis     (Consider location/radiation/quality/duration/timing/severity/associated sxs/prior Treatment) HPI Patient presents to the emergency department with continued pain with his diagnosis of prostatitis.  The patient was seen here several days ago and diagnosed with prostatitis.  The patient states that he continues to pain.  He states he does not have any urological appointment for another few weeks.  He is concerned that he is not having scrotal pain as well.  The patient states that nothing seems make the condition better, but movements and palpation make the pain worse.  The patient states that the medication he takes at home for pain seems to help somewhat, but does not completely resolve his symptoms. The patient denies chest pain, shortness of breath, headache,blurred vision, neck pain, fever, cough, weakness, numbness, dizziness, anorexia, edema, abdominal pain,  vomiting, diarrhea, rash, back pain, dysuria, hematemesis, bloody stool, near syncope, or syncope.  She is also complaining of some nausea as well Past Medical History  Diagnosis Date  . Prostatitis   . Reflux   . History of hematuria    Past Surgical History  Procedure Laterality Date  . Tonsillectomy    . Adenoidectomy    . Knee surgery    . Wisdom tooth extraction    . Ureter surgery     History reviewed. No pertinent family history. Social History  Substance Use Topics  . Smoking status: Current Every Day Smoker -- 0.50 packs/day for 15 years    Types: Cigarettes  . Smokeless tobacco: None  . Alcohol Use: No    Review of Systems All other systems negative except as documented in the HPI. All pertinent positives and negatives as reviewed in the HPI.   Allergies  Bactrim; Clindamycin/lincomycin; Penicillins; Pineapple; and  Toradol  Home Medications   Prior to Admission medications   Medication Sig Start Date End Date Taking? Authorizing Provider  ciprofloxacin (CIPRO) 500 MG tablet Take 1 tablet (500 mg total) by mouth 2 (two) times daily. 11/19/15   Elson Areas, PA-C  HYDROcodone-acetaminophen (NORCO/VICODIN) 5-325 MG tablet Take 2 tablets by mouth every 4 (four) hours as needed. 11/19/15   Elson Areas, PA-C  oxyCODONE-acetaminophen (PERCOCET/ROXICET) 5-325 MG tablet Take 1 tablet by mouth every 6 (six) hours as needed for severe pain. 07/02/15   Ace Gins Sam, PA-C   BP 123/81 mmHg  Pulse 80  Temp(Src) 98.7 F (37.1 C) (Oral)  Resp 18  Ht  (1.778 m)  Wt 113.399 kg  BMI 35.87 kg/m2  SpO2 98% Physical Exam  Constitutional: He is oriented to person, place, and time. He appears well-developed and well-nourished. No distress.  HENT:  Head: Normocephalic and atraumatic.  Mouth/Throat: Oropharynx is clear and moist.  Eyes: Pupils are equal, round, and reactive to light.  Neck: Normal range of motion. Neck supple.  Cardiovascular: Normal rate, regular rhythm and normal heart sounds.  Exam reveals no gallop and no friction rub.   No murmur heard. Pulmonary/Chest: Effort normal and breath sounds normal. No respiratory distress. He has no wheezes.  Abdominal: Soft. Bowel sounds are normal. He exhibits no distension. There is no tenderness.  Genitourinary: Penis normal.    Right testis shows tenderness. Left testis shows tenderness.  Neurological: He is alert and oriented to person, place, and time. He exhibits normal  muscle tone. Coordination normal.  Skin: Skin is warm and dry. No rash noted. No erythema.  Psychiatric: He has a normal mood and affect. His behavior is normal.  Nursing note and vitals reviewed.   ED Course  Procedures (including critical care time) Labs Review Labs Reviewed  URINALYSIS, ROUTINE W REFLEX MICROSCOPIC (NOT AT [facility]) - Abnormal; Notable for the following:     Glucose, UA >1000 (*)    Hgb urine dipstick MODERATE (*)    All other components within normal limits  CBC WITH DIFFERENTIAL/PLATELET - Abnormal; Notable for the following:    WBC 10.8 (*)    All other components within normal limits  COMPREHENSIVE METABOLIC PANEL - Abnormal; Notable for the following:    Glucose, Bld 189 (*)    Calcium 8.7 (*)    All other components within normal limits  URINE MICROSCOPIC-ADD ON - Abnormal; Notable for the following:    Squamous Epithelial / LPF 0-5 (*)    Bacteria, UA RARE (*)    All other components within normal limits  URINE CULTURE  GC/CHLAMYDIA PROBE AMP () NOT AT [facility]    Imaging Review [place] Scrotum  11/24/2015  CLINICAL DATA:  Abdominal/pelvic pain x 1 weeks; started having pain in scrotum L>R today EXAM: SCROTAL ULTRASOUND DOPPLER ULTRASOUND OF THE TESTICLES TECHNIQUE: Complete ultrasound examination of the testicles, epididymis, and other scrotal structures was performed. Color and spectral Doppler ultrasound were also utilized to evaluate blood flow to the testicles. COMPARISON:  None. FINDINGS: Right testicle Measurements: Normal in size and homogeneous echotexture measuring 4.3 x 2.6 x 3.4 cm. No mass or microlithiasis visualized. Left testicle Measurements: Normal size and homogeneous echotexture measuring 4.3 x 2.2 x 2.8 cm. No mass or microlithiasis visualized. Right epididymis:  Normal in size and appearance. Left epididymis:  Normal in size and appearance. Hydrocele:  Small bilateral hydroceles Varicocele:  None visualized. Pulsed Doppler interrogation of both testes demonstrates normal low resistance arterial and venous waveforms bilaterally. IMPRESSION: Normal testicles. Small bilateral hydroceles. Electronically Signed   By: Genevive BiStewart  Edmunds M.D.   On: 11/24/2015 21:05   [place] Art/ven Flow Abd Pelv Doppler  11/24/2015  CLINICAL DATA:  Abdominal/pelvic pain x 1 weeks; started having pain in scrotum L>R today EXAM: SCROTAL ULTRASOUND  DOPPLER ULTRASOUND OF THE TESTICLES TECHNIQUE: Complete ultrasound examination of the testicles, epididymis, and other scrotal structures was performed. Color and spectral Doppler ultrasound were also utilized to evaluate blood flow to the testicles. COMPARISON:  None. FINDINGS: Right testicle Measurements: Normal in size and homogeneous echotexture measuring 4.3 x 2.6 x 3.4 cm. No mass or microlithiasis visualized. Left testicle Measurements: Normal size and homogeneous echotexture measuring 4.3 x 2.2 x 2.8 cm. No mass or microlithiasis visualized. Right epididymis:  Normal in size and appearance. Left epididymis:  Normal in size and appearance. Hydrocele:  Small bilateral hydroceles Varicocele:  None visualized. Pulsed Doppler interrogation of both testes demonstrates normal low resistance arterial and venous waveforms bilaterally. IMPRESSION: Normal testicles. Small bilateral hydroceles. Electronically Signed   By: Genevive BiStewart  Edmunds M.D.   On: 11/24/2015 21:05   I have personally reviewed and evaluated these images and lab results as part of my medical decision-making.  Patient is given IV fluids and treatment for prostatitis.  The patient is advised that he will need to follow-up with urology.  Told to return here as needed.  Patient agrees the plan and all questions were answered.  Patient is advised that prostatitis is very difficult to treat and  that is why he is not better thus far.  The ultrasound of his testicle does not show any abnormality    Charlestine Night, PA-C 11/25/15 0149  Arby Barrette, MD 11/25/15 [phone]

## 2015-11-24 NOTE — ED Notes (Addendum)
Pt dx with prostatitis on 6/15, started on antibiotic and returns today with complaints of increased fatigue, pain radiating to his scrotum. Unable to get appt to see urologist until after July 1st

## 2015-11-24 NOTE — ED Notes (Signed)
Pt returned from ultrasound

## 2015-11-25 LAB — GC/CHLAMYDIA PROBE AMP (~~LOC~~) NOT AT ARMC
Chlamydia: NEGATIVE
Neisseria Gonorrhea: NEGATIVE

## 2015-11-26 LAB — URINE CULTURE: Culture: NO GROWTH

## 2016-01-08 ENCOUNTER — Emergency Department (HOSPITAL_BASED_OUTPATIENT_CLINIC_OR_DEPARTMENT_OTHER)
Admission: EM | Admit: 2016-01-08 | Discharge: 2016-01-08 | Disposition: A | Discharge status: Home / Self Care | Payer: BLUE CROSS/BLUE SHIELD | Attending: Emergency Medicine | Admitting: Emergency Medicine

## 2016-01-08 ENCOUNTER — Encounter (HOSPITAL_BASED_OUTPATIENT_CLINIC_OR_DEPARTMENT_OTHER): Payer: Self-pay | Admitting: Emergency Medicine

## 2016-01-08 DIAGNOSIS — S025XXA Fracture of tooth (traumatic), initial encounter for closed fracture: Secondary | ICD-10-CM | POA: Diagnosis not present

## 2016-01-08 DIAGNOSIS — Y9389 Activity, other specified: Secondary | ICD-10-CM | POA: Insufficient documentation

## 2016-01-08 DIAGNOSIS — Y929 Unspecified place or not applicable: Secondary | ICD-10-CM | POA: Insufficient documentation

## 2016-01-08 DIAGNOSIS — F17210 Nicotine dependence, cigarettes, uncomplicated: Secondary | ICD-10-CM | POA: Diagnosis not present

## 2016-01-08 DIAGNOSIS — X58XXXA Exposure to other specified factors, initial encounter: Secondary | ICD-10-CM | POA: Insufficient documentation

## 2016-01-08 DIAGNOSIS — R110 Nausea: Secondary | ICD-10-CM | POA: Diagnosis not present

## 2016-01-08 DIAGNOSIS — Y999 Unspecified external cause status: Secondary | ICD-10-CM | POA: Diagnosis not present

## 2016-01-08 DIAGNOSIS — K0889 Other specified disorders of teeth and supporting structures: Secondary | ICD-10-CM | POA: Diagnosis present

## 2016-01-08 MED ORDER — IBUPROFEN 800 MG PO TABS
800.0000 mg | ORAL_TABLET | Freq: Once | ORAL | Status: AC
  Administered 2016-01-08: 800 mg via ORAL
  Filled 2016-01-08: qty 1

## 2016-01-08 MED ORDER — HYDROCODONE-ACETAMINOPHEN 5-325 MG PO TABS
1.0000 | ORAL_TABLET | Freq: Once | ORAL | Status: AC
  Administered 2016-01-08: 1 via ORAL
  Filled 2016-01-08: qty 1

## 2016-01-08 NOTE — Discharge Instructions (Addendum)
Please follow up with a dentist as soon as possible for your tooth injury. List of resources have been provided for you.
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
## 2016-01-08 NOTE — ED Triage Notes (Signed)
Dental pain to front teeth.  Pt states he bit granola bar and broke front teeth.  Pt has numerous dental caries and POH.

## 2016-02-04 ENCOUNTER — Encounter (HOSPITAL_BASED_OUTPATIENT_CLINIC_OR_DEPARTMENT_OTHER): Payer: Self-pay

## 2016-02-04 ENCOUNTER — Emergency Department (HOSPITAL_BASED_OUTPATIENT_CLINIC_OR_DEPARTMENT_OTHER)
Admission: EM | Admit: 2016-02-04 | Discharge: 2016-02-04 | Disposition: A | Discharge status: Home / Self Care | Payer: BLUE CROSS/BLUE SHIELD | Attending: Emergency Medicine | Admitting: Emergency Medicine

## 2016-02-04 DIAGNOSIS — N419 Inflammatory disease of prostate, unspecified: Secondary | ICD-10-CM | POA: Diagnosis not present

## 2016-02-04 DIAGNOSIS — F17210 Nicotine dependence, cigarettes, uncomplicated: Secondary | ICD-10-CM | POA: Insufficient documentation

## 2016-02-04 DIAGNOSIS — R112 Nausea with vomiting, unspecified: Secondary | ICD-10-CM | POA: Diagnosis present

## 2016-02-04 DIAGNOSIS — R197 Diarrhea, unspecified: Secondary | ICD-10-CM | POA: Insufficient documentation

## 2016-02-04 DIAGNOSIS — R51 Headache: Secondary | ICD-10-CM | POA: Insufficient documentation

## 2016-02-04 LAB — URINE MICROSCOPIC-ADD ON

## 2016-02-04 LAB — CBC WITH DIFFERENTIAL/PLATELET
BASOS ABS: 0 10*3/uL (ref 0.0–0.1)
BASOS PCT: 0 %
EOS ABS: 0 10*3/uL (ref 0.0–0.7)
Eosinophils Relative: 0 %
HEMATOCRIT: 44.3 % (ref 39.0–52.0)
HEMOGLOBIN: 16.1 g/dL (ref 13.0–17.0)
Lymphocytes Relative: 19 %
Lymphs Abs: 2 10*3/uL (ref 0.7–4.0)
MCH: 33.6 pg (ref 26.0–34.0)
MCHC: 36.3 g/dL — ABNORMAL HIGH (ref 30.0–36.0)
MCV: 92.5 fL (ref 78.0–100.0)
Monocytes Absolute: 0.7 10*3/uL (ref 0.1–1.0)
Monocytes Relative: 6 %
NEUTROS ABS: 8.2 10*3/uL — AB (ref 1.7–7.7)
NEUTROS PCT: 75 %
Platelets: 269 10*3/uL (ref 150–400)
RBC: 4.79 MIL/uL (ref 4.22–5.81)
RDW: 12.6 % (ref 11.5–15.5)
WBC: 10.9 10*3/uL — AB (ref 4.0–10.5)

## 2016-02-04 LAB — COMPREHENSIVE METABOLIC PANEL
ALBUMIN: 4.3 g/dL (ref 3.5–5.0)
ALK PHOS: 50 U/L (ref 38–126)
ALT: 25 U/L (ref 17–63)
AST: 20 U/L (ref 15–41)
Anion gap: 8 (ref 5–15)
BILIRUBIN TOTAL: 0.5 mg/dL (ref 0.3–1.2)
BUN: 10 mg/dL (ref 6–20)
CO2: 24 mmol/L (ref 22–32)
Calcium: 9.2 mg/dL (ref 8.9–10.3)
Chloride: 106 mmol/L (ref 101–111)
Creatinine, Ser: 0.84 mg/dL (ref 0.61–1.24)
GFR calc Af Amer: >60 mL/min (ref >60)
GFR calc non Af Amer: >60 mL/min (ref >60)
GLUCOSE: 191 mg/dL — AB (ref 65–99)
POTASSIUM: 3.4 mmol/L — AB (ref 3.5–5.1)
SODIUM: 138 mmol/L (ref 135–145)
TOTAL PROTEIN: 7.4 g/dL (ref 6.5–8.1)

## 2016-02-04 LAB — URINALYSIS, ROUTINE W REFLEX MICROSCOPIC
Bilirubin Urine: NEGATIVE
Glucose, UA: NEGATIVE mg/dL
KETONES UR: NEGATIVE mg/dL
LEUKOCYTES UA: NEGATIVE
NITRITE: NEGATIVE
PROTEIN: NEGATIVE mg/dL
Specific Gravity, Urine: 1.012 (ref 1.005–1.030)
pH: 6 (ref 5.0–8.0)

## 2016-02-04 LAB — LIPASE, BLOOD: Lipase: 37 U/L (ref 11–51)

## 2016-02-04 MED ORDER — SODIUM CHLORIDE 0.9 % IV BOLUS (SEPSIS)
1000.0000 mL | Freq: Once | INTRAVENOUS | Status: AC
  Administered 2016-02-04: 1000 mL via INTRAVENOUS

## 2016-02-04 MED ORDER — ONDANSETRON HCL 4 MG/2ML IJ SOLN
4.0000 mg | Freq: Once | INTRAMUSCULAR | Status: AC
  Administered 2016-02-04: 4 mg via INTRAVENOUS
  Filled 2016-02-04: qty 2

## 2016-02-04 MED ORDER — MORPHINE SULFATE (PF) 4 MG/ML IV SOLN
4.0000 mg | Freq: Once | INTRAVENOUS | Status: AC
  Administered 2016-02-04: 4 mg via INTRAVENOUS
  Filled 2016-02-04: qty 1

## 2016-02-04 NOTE — ED Triage Notes (Signed)
C/o n/v/d,abd pain, dysuria x 2 days

## 2016-02-04 NOTE — ED Provider Notes (Signed)
[facility] Provider Note   CSN: [id number] Arrival date & time: 02/04/16  1132     History   Chief Complaint Chief Complaint  Patient presents with  . Vomiting    HPI Terrance MesaJamie E Giebel is a 40 y.o. male with history of prostatitis and chronic back pain presenting with 3 days of nausea, vomiting, diarrhea, and pain in abdomen, lower back, and groin pain.  He has been feeling poorly for the past several days with concurrent onset of multiple symptoms.  He first noticed an episode of dysuria with blood-tinged urine.  Then developed nausea, NBNB vomiting, loose stools, pain around lower back and groin (different from his chronic back pain).  No fevers, some subjective chills.  He has had serial presentations with similar symptoms without signs of bacterial infection.  Seen last at [facility] on 12/22/15 for similar symptoms.  CT renal negative, testicular US negative, GC/Chlam negative.  Treated with two weeks of ciprofloxacin, last dose about 2 weeks ago.  HPI  Past Medical History:  Diagnosis Date  . History of hematuria   . Prostatitis   . Reflux     There are no active problems to display for this patient.   Past Surgical History:  Procedure Laterality Date  . ADENOIDECTOMY    . KNEE SURGERY    . TONSILLECTOMY    . URETER SURGERY    . WISDOM TOOTH EXTRACTION         Home Medications    Prior to Admission medications   Medication Sig Start Date End Date Taking? Authorizing Provider  oxyCODONE-acetaminophen (PERCOCET/ROXICET) 5-325 MG tablet Take 1 tablet by mouth every 6 (six) hours as needed for severe pain. 07/02/15   Carlene CoriaSerena Y Sam, PA-C    Family History No family history on file.  Social History Social History  Substance Use Topics  . Smoking status: Current Every Day Smoker    Packs/day: 0.50    Years: 15.00    Types: Cigarettes  . Smokeless tobacco: Never Used  . Alcohol use No     Allergies   Bactrim [sulfamethoxazole-trimethoprim];  Clindamycin/lincomycin; Penicillins; Pineapple; and Toradol [ketorolac tromethamine]   Review of Systems Review of Systems  Constitutional: Positive for chills. Negative for fever.  HENT: Negative for sore throat.   Eyes: Negative for visual disturbance.  Respiratory: Negative for cough, chest tightness and shortness of breath.   Cardiovascular: Negative for chest pain and leg swelling.  Gastrointestinal: Positive for abdominal pain, diarrhea, nausea and vomiting. Negative for blood in stool.  Genitourinary: Positive for dysuria, flank pain, frequency and hematuria. Negative for scrotal swelling and testicular pain.  Musculoskeletal: Positive for back pain. Negative for myalgias.  Neurological: Positive for headaches. Negative for syncope and weakness.  Psychiatric/Behavioral: Negative for dysphoric mood. The patient is not nervous/anxious.      Physical Exam Updated Vital Signs BP 117/87 (BP Location: Left Arm)   Pulse 105   Temp 98.5 F (36.9 C) (Oral)   Resp 18   Ht 5\' 10"  (1.778 m)   Wt 90.7 kg   SpO2 100%   BMI 28.70 kg/m   Physical Exam  Constitutional: He is oriented to person, place, and time. He appears well-developed and well-nourished. No distress.  HENT:  Head: Normocephalic and atraumatic.  Eyes: Conjunctivae are normal. No scleral icterus.  Cardiovascular: Normal rate and regular rhythm.   Pulmonary/Chest: Effort normal and breath sounds normal.  Abdominal: Soft. He exhibits no distension. There is no tenderness.  Genitourinary: Penis  normal.  Genitourinary Comments: Prostate tenderness without enlargement or bogginess No testicular swelling or tenderness No CVA tenderness  Musculoskeletal: He exhibits no edema or tenderness.  Neurological: He is alert and oriented to person, place, and time.  Psychiatric: He has a normal mood and affect. His behavior is normal.     ED Treatments / Results  Labs (all labs ordered are listed, but only abnormal results  are displayed) Labs Reviewed  URINALYSIS, ROUTINE W REFLEX MICROSCOPIC (NOT AT [facility]) - Abnormal; Notable for the following:       Result Value   APPearance CLOUDY (*)    Hgb urine dipstick MODERATE (*)    All other components within normal limits  CBC WITH DIFFERENTIAL/PLATELET - Abnormal; Notable for the following:    WBC 10.9 (*)    MCHC 36.3 (*)    Neutro Abs 8.2 (*)    All other components within normal limits  COMPREHENSIVE METABOLIC PANEL - Abnormal; Notable for the following:    Potassium 3.4 (*)    Glucose, Bld 191 (*)    All other components within normal limits  URINE MICROSCOPIC-ADD ON - Abnormal; Notable for the following:    Squamous Epithelial / LPF 0-5 (*)    Bacteria, UA FEW (*)    All other components within normal limits  LIPASE, BLOOD    EKG  EKG Interpretation None       Radiology No results found.  Procedures Procedures (including critical care time)  Medications Ordered in ED Medications  sodium chloride 0.9 % bolus 1,000 mL (0 mLs Intravenous Stopped 02/04/16 1309)  ondansetron (ZOFRAN) injection 4 mg (4 mg Intravenous Given 02/04/16 1206)  morphine 4 MG/ML injection 4 mg (4 mg Intravenous Given 02/04/16 1244)     Initial Impression / Assessment and Plan / ED Course  I have reviewed the triage vital signs and the nursing notes.  Pertinent labs & imaging results that were available during my care of the patient were reviewed by me and considered in my medical decision making (see chart for details).  40 year old with chronic pain with consititutional and urologic symptoms who presents with similar complaints.  Recent previously workup includes CT AP, testicular US, and GC/Chlamydia testing.  Differential includes prostatitis (acute bacterial, chronic bacterial, chronic functional), UTI (cystitis, pyelo), testicular torsion, and gastroenteritis. -CBC -CMP -UA  Clinical Course  Comment By Time  4mg  IV morphine and zofran for symptom management  Alm Bustard, MD 08/31 1215      Final Clinical Impressions(s) / ED Diagnoses   Final diagnoses:  Prostatitis, unspecified prostatitis type   No evidence of bacterial infection.  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  Follow-up with urology for hematuria.  New Prescriptions Discharge Medication List as of 02/04/2016  1:03 PM       Alm Bustard, MD 02/04/16 1442    Charlynne Pander, MD 02/04/16 [phone]

## 2016-02-04 NOTE — ED Notes (Signed)
Pt requesting pain meds, Dr Silverio LayYao notified, no orders given

## 2016-02-04 NOTE — Discharge Instructions (Signed)
You symptoms are likely from chronic prostatitis.  There is no evidence of infection in your urine, so we will not prescribe you more antibiotics.  There was blood in your urine today, and has been in the past.  Please make an appointment to follow-up with a urologist.  If you have fevers, chills, or worsening of your other symptoms, please come back to the ED.

## 2016-02-09 ENCOUNTER — Emergency Department (HOSPITAL_BASED_OUTPATIENT_CLINIC_OR_DEPARTMENT_OTHER)
Admission: EM | Admit: 2016-02-09 | Discharge: 2016-02-09 | Disposition: A | Discharge status: Home / Self Care | Payer: BLUE CROSS/BLUE SHIELD | Attending: Emergency Medicine | Admitting: Emergency Medicine

## 2016-02-09 ENCOUNTER — Encounter (HOSPITAL_BASED_OUTPATIENT_CLINIC_OR_DEPARTMENT_OTHER): Payer: Self-pay | Admitting: Emergency Medicine

## 2016-02-09 DIAGNOSIS — F17210 Nicotine dependence, cigarettes, uncomplicated: Secondary | ICD-10-CM | POA: Insufficient documentation

## 2016-02-09 DIAGNOSIS — R1030 Lower abdominal pain, unspecified: Secondary | ICD-10-CM | POA: Diagnosis present

## 2016-02-09 DIAGNOSIS — K0889 Other specified disorders of teeth and supporting structures: Secondary | ICD-10-CM | POA: Diagnosis not present

## 2016-02-09 DIAGNOSIS — N419 Inflammatory disease of prostate, unspecified: Secondary | ICD-10-CM

## 2016-02-09 LAB — COMPREHENSIVE METABOLIC PANEL
ALK PHOS: 59 U/L (ref 38–126)
ALT: 25 U/L (ref 17–63)
ANION GAP: 8 (ref 5–15)
AST: 22 U/L (ref 15–41)
Albumin: 4.7 g/dL (ref 3.5–5.0)
BILIRUBIN TOTAL: 0.4 mg/dL (ref 0.3–1.2)
BUN: 9 mg/dL (ref 6–20)
CALCIUM: 9.3 mg/dL (ref 8.9–10.3)
CO2: 25 mmol/L (ref 22–32)
Chloride: 106 mmol/L (ref 101–111)
Creatinine, Ser: 0.7 mg/dL (ref 0.61–1.24)
GFR calc non Af Amer: >60 mL/min (ref >60)
Glucose, Bld: 122 mg/dL — ABNORMAL HIGH (ref 65–99)
Potassium: 3.5 mmol/L (ref 3.5–5.1)
SODIUM: 139 mmol/L (ref 135–145)
TOTAL PROTEIN: 7.4 g/dL (ref 6.5–8.1)

## 2016-02-09 LAB — URINE MICROSCOPIC-ADD ON: WBC UA: NONE SEEN WBC/hpf (ref 0–5)

## 2016-02-09 LAB — URINALYSIS, ROUTINE W REFLEX MICROSCOPIC
Bilirubin Urine: NEGATIVE
Glucose, UA: NEGATIVE mg/dL
Ketones, ur: NEGATIVE mg/dL
Leukocytes, UA: NEGATIVE
NITRITE: NEGATIVE
Protein, ur: NEGATIVE mg/dL
SPECIFIC GRAVITY, URINE: 1.004 — AB (ref 1.005–1.030)
pH: 6 (ref 5.0–8.0)

## 2016-02-09 LAB — CBC
HCT: 44.1 % (ref 39.0–52.0)
HEMOGLOBIN: 16.2 g/dL (ref 13.0–17.0)
MCH: 33.6 pg (ref 26.0–34.0)
MCHC: 36.7 g/dL — ABNORMAL HIGH (ref 30.0–36.0)
MCV: 91.5 fL (ref 78.0–100.0)
Platelets: 297 10*3/uL (ref 150–400)
RBC: 4.82 MIL/uL (ref 4.22–5.81)
RDW: 12.5 % (ref 11.5–15.5)
WBC: 9.5 10*3/uL (ref 4.0–10.5)

## 2016-02-09 LAB — LIPASE, BLOOD: Lipase: 27 U/L (ref 11–51)

## 2016-02-09 MED ORDER — ONDANSETRON HCL 4 MG/2ML IJ SOLN
4.0000 mg | Freq: Once | INTRAMUSCULAR | Status: AC
  Administered 2016-02-09: 4 mg via INTRAVENOUS
  Filled 2016-02-09: qty 2

## 2016-02-09 MED ORDER — DOXYCYCLINE HYCLATE 100 MG PO CAPS: 100.0000 mg | ORAL_CAPSULE | Freq: Two times a day (BID) | ORAL | 0 refills | Status: AC

## 2016-02-09 MED ORDER — SODIUM CHLORIDE 0.9 % IV BOLUS (SEPSIS)
1000.0000 mL | Freq: Once | INTRAVENOUS | Status: AC
  Administered 2016-02-09: 1000 mL via INTRAVENOUS

## 2016-02-09 MED ORDER — MORPHINE SULFATE (PF) 4 MG/ML IV SOLN
4.0000 mg | Freq: Once | INTRAVENOUS | Status: AC
  Administered 2016-02-09: 4 mg via INTRAVENOUS
  Filled 2016-02-09: qty 1

## 2016-02-09 MED ORDER — ONDANSETRON HCL 4 MG PO TABS: 4.0000 mg | ORAL_TABLET | Freq: Four times a day (QID) | ORAL | 0 refills | Status: DC | PRN

## 2016-02-09 MED ORDER — NAPROXEN 500 MG PO TABS: 500.0000 mg | ORAL_TABLET | Freq: Two times a day (BID) | ORAL | 0 refills | Status: DC

## 2016-02-09 NOTE — ED Provider Notes (Signed)
[facility] Note   CSN: [id number] Arrival date & time: 02/09/16  1137     History   Chief Complaint Chief Complaint  Patient presents with  . Groin Pain  . Emesis    HPI Terrance Hodges is a 40 y.o. male who presents with chronic suprapubic pain, urinary frequency, dysuria, prostate pain, low back pain. 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 Patient states he began feeling better, but began vomiting yesterday and had 2 syncopal episodes lasting 1-2 minutes. Patient had vomited prior to passing out both times. Patient's symptoms have been the same since August and prior when he was treated with Cipro many times. He reports this did not help his symptoms, and only made him sick to his stomach. Patient was seen at [facility] and had a negative renal stone study, testicular ultrasound. Patient states he has had all but the syncopal episodes with his symptoms prior. Patient has been taking Tylenol without relief. Patient also reports right upper dental pain over the past couple days. Patient states that she has an appointment with his dentist tomorrow, but needs to have mention of his dental pain in the emergency department for free dental work. Patient states cannot be treated with antibiotics for his dental pain because he is allergic to penicillin and clindamycin. Patient denies any fevers, chest pain, shortness of breath. Patient has been treated in the past with Percocet for his back pain and was on a pain contract. He is no longer seeing the pain specialist, because he states the Percocet was not helping and it was only like taking Tylenol. Patient does not want to take Tylenol anymore, because it upsets his stomach. Patient has an appointment with urology scheduled for 02/19/2016.  HPI  Past Medical History:  Diagnosis Date  . History of hematuria   . Prostatitis   . Reflux     There are no active problems to  display for this patient.   Past Surgical History:  Procedure Laterality Date  . ADENOIDECTOMY    . KNEE SURGERY    . TONSILLECTOMY    . URETER SURGERY    . WISDOM TOOTH EXTRACTION         Home Medications    Prior to Admission medications   Medication Sig Start Date End Date Taking? Authorizing Provider  doxycycline (VIBRAMYCIN) 100 MG capsule Take 1 capsule (100 mg total) by mouth 2 (two) times daily. 02/09/16 03/08/16  Waylan Boga Ewen Varnell, PA-C  naproxen (NAPROSYN) 500 MG tablet Take 1 tablet (500 mg total) by mouth 2 (two) times daily with a meal. 02/09/16   Waylan Boga Samreet [place]field, PA-C  ondansetron (ZOFRAN) 4 MG tablet Take 1 tablet (4 mg total) by mouth every 6 (six) hours as needed for nausea or vomiting. 02/09/16   Emi Holes, PA-C  oxyCODONE-acetaminophen (PERCOCET/ROXICET) 5-325 MG tablet Take 1 tablet by mouth every 6 (six) hours as needed for severe pain. 07/02/15   Carlene Coria, PA-C    Family History History reviewed. No pertinent family history.  Social History Social History  Substance Use Topics  . Smoking status: Current Every Day Smoker    Packs/day: 0.50    Years: 15.00    Types: Cigarettes  . Smokeless tobacco: Never Used  . Alcohol use No     Allergies   Bactrim [sulfamethoxazole-trimethoprim]; Clindamycin/lincomycin; Penicillins; Pineapple; and Toradol [ketorolac tromethamine]   Review of Systems Review of Systems  Constitutional: Negative  for chills and fever.  HENT: Negative for facial swelling and sore throat.   Respiratory: Negative for shortness of breath.   Cardiovascular: Negative for chest pain.  Gastrointestinal: Positive for abdominal pain (suprapubic). Negative for nausea and vomiting.  Genitourinary: Positive for dysuria and frequency.  Musculoskeletal: Positive for back pain.  Skin: Negative for rash and wound.  Neurological: Negative for headaches.  Psychiatric/Behavioral: The patient is not nervous/anxious.      Physical Exam Updated  Vital Signs BP 135/85 (BP Location: Left Arm)   Pulse 75   Temp 99 F (37.2 C) (Oral)   Resp 18   Ht 5\' 10"  (1.778 m)   Wt 90.7 kg   SpO2 98%   BMI 28.70 kg/m   Physical Exam  Constitutional: He appears well-developed and well-nourished. No distress.  HENT:  Head: Normocephalic and atraumatic.  Mouth/Throat: Oropharynx is clear and moist. No trismus in the jaw. Abnormal dentition. Dental caries present. No oropharyngeal exudate, posterior oropharyngeal edema, posterior oropharyngeal erythema or tonsillar abscesses.    Eyes: Conjunctivae are normal. Pupils are equal, round, and reactive to light. Right eye exhibits no discharge. Left eye exhibits no discharge. No scleral icterus.  Neck: Normal range of motion. Neck supple. No thyromegaly present.  Cardiovascular: Normal rate, regular rhythm, normal heart sounds and intact distal pulses.  Exam reveals no gallop and no friction rub.   No murmur heard. Pulmonary/Chest: Effort normal and breath sounds normal. No stridor. No respiratory distress. He has no wheezes. He has no rales.  Abdominal: Soft. Bowel sounds are normal. He exhibits no distension. There is tenderness in the suprapubic area. There is no rebound, no guarding and no CVA tenderness.  Genitourinary: Penis normal. Prostate is tender (significantly). Right testis shows tenderness (mild). Right testis shows no mass and no swelling. Left testis shows tenderness (mild). Left testis shows no mass and no swelling.  Genitourinary Comments: Patient with significant tenderness to prostate, mild bogginess, perineal tenderness  Musculoskeletal: He exhibits no edema.  Lymphadenopathy:    He has no cervical adenopathy.  Neurological: He is alert. Coordination normal.  Skin: Skin is warm and dry. No rash noted. He is not diaphoretic. No pallor.  Psychiatric: He has a normal mood and affect.  Nursing note and vitals reviewed.    ED Treatments / Results  Labs (all labs ordered are  listed, but only abnormal results are displayed) Labs Reviewed  COMPREHENSIVE METABOLIC PANEL - Abnormal; Notable for the following:       Result Value   Glucose, Bld 122 (*)    All other components within normal limits  CBC - Abnormal; Notable for the following:    MCHC 36.7 (*)    All other components within normal limits  URINALYSIS, ROUTINE W REFLEX MICROSCOPIC (NOT AT [facility]) - Abnormal; Notable for the following:    Specific Gravity, Urine 1.004 (*)    Hgb urine dipstick MODERATE (*)    All other components within normal limits  URINE MICROSCOPIC-ADD ON - Abnormal; Notable for the following:    Squamous Epithelial / LPF 0-5 (*)    Bacteria, UA RARE (*)    All other components within normal limits  LIPASE, BLOOD    EKG  EKG Interpretation None       Radiology No results found.  Procedures Procedures (including critical care time)  Medications Ordered in ED Medications  sodium chloride 0.9 % bolus 1,000 mL (0 mLs Intravenous Stopped 02/09/16 1430)  morphine 4 MG/ML injection 4 mg (4  mg Intravenous Given 02/09/16 1320)  ondansetron (ZOFRAN) injection 4 mg (4 mg Intravenous Given 02/09/16 1319)     Initial Impression / Assessment and Plan / ED Course  I have reviewed the triage vital signs and the nursing notes.  Pertinent labs & imaging results that were available during my care of the patient were reviewed by me and considered in my medical decision making (see chart for details).  Clinical Course    CBC unremarkable. CMP shows glucose 122. UA shows moderate hematuria, rare bacteria, negative leukocytes. Suspect prostatitis. We will treat with doxycycline for more broad spectrum coverage.  Patient discharged with Naprosyn and Zofran for symptomatic treatment. Patient to follow-up with dentist tomorrow and urologist on September 15. Will not treat dental pain with antibiotic due to allergies at this time. Return precautions discussed. Patient understands and agrees with  plan. I discussed patient case with Dr. Anitra LauthPlunkett who guided the patient's management and agrees with plan. Patient vitals stable throughout ED course and discharged in satisfactory condition.  Final Clinical Impressions(s) / ED Diagnoses   Final diagnoses:  Prostatitis, unspecified prostatitis type  Pain, dental    New Prescriptions Discharge Medication List as of 02/09/2016  2:54 PM    START taking these medications   Details  doxycycline (VIBRAMYCIN) 100 MG capsule Take 1 capsule (100 mg total) by mouth 2 (two) times daily., Starting Tue 02/09/2016, Until Tue 03/08/2016, Print    naproxen (NAPROSYN) 500 MG tablet Take 1 tablet (500 mg total) by mouth 2 (two) times daily with a meal., Starting Tue 02/09/2016, Print    ondansetron (ZOFRAN) 4 MG tablet Take 1 tablet (4 mg total) by mouth every 6 (six) hours as needed for nausea or vomiting., Starting Tue 02/09/2016, Print         Emi HolesAlexandra M Kloie Whiting, PA-C 02/09/16 1832    Gwyneth SproutWhitney Plunkett, MD 02/09/16 1941

## 2016-02-09 NOTE — Discharge Instructions (Signed)
Medications: Doxycycline, Naprosyn, Zofran  Treatment: Take doxycycline twice daily for 28 days or until told otherwise by urology. Take Naprosyn twice daily as prescribed. Take Zofran every 6 hours as needed for nausea and vomiting. Make sure to drink plenty of water, at least 8 glasses of water daily.  Follow-up: Please follow-up with your dentist tomorrow at your scheduled appointment for further evaluation and treatment of your dental pain. Please follow-up at your scheduled urology appointment on September 15. You can also try to call to see if there is any way you could be seen sooner. Please return to emergency department if you develop any new or worsening symptoms.

## 2016-02-09 NOTE — ED Triage Notes (Signed)
Pt in c/o continued groin and lower back pain since his visit for prostatitis last week. States he has now been vomiting and passing out, currently alert, interactive, in NAD. No emesis noted.

## 2016-04-09 ENCOUNTER — Encounter (HOSPITAL_COMMUNITY): Payer: Self-pay | Admitting: Emergency Medicine

## 2016-04-09 ENCOUNTER — Emergency Department (HOSPITAL_COMMUNITY): Payer: BLUE CROSS/BLUE SHIELD

## 2016-04-09 ENCOUNTER — Inpatient Hospital Stay (HOSPITAL_COMMUNITY)
Admission: EM | Admit: 2016-04-09 | Discharge: 2016-04-09 | DRG: 310 | Discharge status: Against Medical Advice | Payer: BLUE CROSS/BLUE SHIELD | Attending: Cardiovascular Disease | Admitting: Cardiovascular Disease

## 2016-04-09 DIAGNOSIS — I471 Supraventricular tachycardia, unspecified: Secondary | ICD-10-CM | POA: Diagnosis present

## 2016-04-09 DIAGNOSIS — Z881 Allergy status to other antibiotic agents status: Secondary | ICD-10-CM

## 2016-04-09 DIAGNOSIS — F17210 Nicotine dependence, cigarettes, uncomplicated: Secondary | ICD-10-CM | POA: Diagnosis present

## 2016-04-09 DIAGNOSIS — Z882 Allergy status to sulfonamides status: Secondary | ICD-10-CM

## 2016-04-09 DIAGNOSIS — Z7984 Long term (current) use of oral hypoglycemic drugs: Secondary | ICD-10-CM | POA: Diagnosis not present

## 2016-04-09 DIAGNOSIS — Z79899 Other long term (current) drug therapy: Secondary | ICD-10-CM | POA: Diagnosis not present

## 2016-04-09 DIAGNOSIS — Z91018 Allergy to other foods: Secondary | ICD-10-CM

## 2016-04-09 DIAGNOSIS — Z880 Allergy status to penicillin: Secondary | ICD-10-CM

## 2016-04-09 LAB — RAPID URINE DRUG SCREEN, HOSP PERFORMED
AMPHETAMINES: NOT DETECTED
BARBITURATES: NOT DETECTED
Benzodiazepines: NOT DETECTED
Cocaine: NOT DETECTED
Opiates: NOT DETECTED
TETRAHYDROCANNABINOL: NOT DETECTED

## 2016-04-09 LAB — CBC
HEMATOCRIT: 45.7 % (ref 39.0–52.0)
Hemoglobin: 16.4 g/dL (ref 13.0–17.0)
MCH: 34.3 pg — ABNORMAL HIGH (ref 26.0–34.0)
MCHC: 35.9 g/dL (ref 30.0–36.0)
MCV: 95.6 fL (ref 78.0–100.0)
PLATELETS: 318 10*3/uL (ref 150–400)
RBC: 4.78 MIL/uL (ref 4.22–5.81)
RDW: 12.9 % (ref 11.5–15.5)
WBC: 15.3 10*3/uL — AB (ref 4.0–10.5)

## 2016-04-09 LAB — I-STAT TROPONIN, ED: Troponin i, poc: 0 ng/mL (ref 0.00–0.08)

## 2016-04-09 LAB — BASIC METABOLIC PANEL
Anion gap: 9 (ref 5–15)
BUN: 10 mg/dL (ref 6–20)
CHLORIDE: 107 mmol/L (ref 101–111)
CO2: 25 mmol/L (ref 22–32)
CREATININE: 0.98 mg/dL (ref 0.61–1.24)
Calcium: 9.3 mg/dL (ref 8.9–10.3)
GFR calc non Af Amer: >60 mL/min (ref >60)
Glucose, Bld: 124 mg/dL — ABNORMAL HIGH (ref 65–99)
POTASSIUM: 3.5 mmol/L (ref 3.5–5.1)
SODIUM: 141 mmol/L (ref 135–145)

## 2016-04-09 MED ORDER — OXYCODONE-ACETAMINOPHEN 5-325 MG PO TABS
1.0000 | ORAL_TABLET | Freq: Once | ORAL | Status: AC
  Administered 2016-04-09: 1 via ORAL
  Filled 2016-04-09: qty 1

## 2016-04-09 MED ORDER — DILTIAZEM HCL ER COATED BEADS 120 MG PO CP24: 120.0000 mg | ORAL_CAPSULE | Freq: Every day | ORAL | Status: DC

## 2016-04-09 MED ORDER — ACETAMINOPHEN 325 MG PO TABS: 650.0000 mg | ORAL_TABLET | ORAL | Status: DC | PRN

## 2016-04-09 MED ORDER — ONDANSETRON HCL 4 MG/2ML IJ SOLN: 4.0000 mg | Freq: Four times a day (QID) | INTRAMUSCULAR | Status: DC | PRN

## 2016-04-09 MED ORDER — ADENOSINE 6 MG/2ML IV SOLN
6.0000 mg | Freq: Once | INTRAVENOUS | Status: DC
  Filled 2016-04-09: qty 2

## 2016-04-09 MED ORDER — OXYCODONE-ACETAMINOPHEN 5-325 MG PO TABS: 1.0000 | ORAL_TABLET | Freq: Four times a day (QID) | ORAL | Status: DC | PRN

## 2016-04-09 MED ORDER — FLECAINIDE ACETATE 50 MG PO TABS
150.0000 mg | ORAL_TABLET | Freq: Two times a day (BID) | ORAL | Status: DC
  Filled 2016-04-09: qty 1

## 2016-04-09 NOTE — H&P (Signed)
History & Physical    Patient ID: Terrance Hodges MRN: [id number], DOB/AGE: 6[name]/17/1977   Admit date: 04/09/2016   Primary Physician: No PCP Per Patient Primary Cardiologist: none  Patient Profile    40 y o man with SVT  Past Medical History    Past Medical History:  Diagnosis Date  . History of hematuria   . Prostatitis   . Reflux     Past Surgical History:  Procedure Laterality Date  . ADENOIDECTOMY    . KNEE SURGERY    . TONSILLECTOMY    . URETER SURGERY    . WISDOM TOOTH EXTRACTION       Allergies  Allergies  Allergen Reactions  . Bactrim [Sulfamethoxazole-Trimethoprim]   . Clindamycin/Lincomycin Hives  . Penicillins   . Pineapple   . Toradol [Ketorolac Tromethamine] Hives    History of Present Illness    Terrance Hodges has a PMH significant for borderline DM and intermittent palpitations. He developed palpitations 6 weeks ago for the first time. Episodes occur daily up to two times and last seconds to couple minutes. He has not passed out. He had a near syncopal episode once he was started on metoprolol after diagnosis. Drug was shortly after discontinued and he was switched to dilt and flecainaide. He has not seen a cardiologist yet but was seen in the ED and admitted to the hospital at [place] times. He required adenosine once at OSH ED.   He is waiting to see an EP locally which has been scheduled far out. He and his wife are in dispair. The patient appears to be in a great amount of distress about this. He feels dysfunctional. Denies SOB or CP. He cont to have as many SVT episodes as before despite new meds. He does not miss meds. Does not consume illicit drugs or ethanol. Does smoke. 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    No family history of heart disease or sudden death.   Workup in the past included: TTE (last month which was nromal) and stress which was negative.  Previously documents ECG report  Afib at rates  of 110. Here he had an ECG with SVT rate of 170 and 200.    Home Medications    Prior to Admission medications   Medication Sig Start Date End Date Taking? Authorizing Provider  diltiazem (CARDIZEM CD) 120 MG 24 hr capsule Take 120 mg by mouth daily.   Yes Historical Provider, MD  flecainide (TAMBOCOR) 150 MG tablet Take 150 mg by mouth 2 (two) times daily.   Yes Historical Provider, MD  metformin (FORTAMET) 500 MG (OSM) 24 hr tablet Take 500 mg by mouth daily with breakfast.   Yes Historical Provider, MD  oxyCODONE-acetaminophen (PERCOCET/ROXICET) 5-325 MG tablet Take 1 tablet by mouth every 6 (six) hours as needed for severe pain. 07/02/15  Yes Ace GinsSerena Y Sam, PA-C  naproxen (NAPROSYN) 500 MG tablet Take 1 tablet (500 mg total) by mouth 2 (two) times daily with a meal. 02/09/16   Waylan BogaAlexandra M Law, PA-C  ondansetron (ZOFRAN) 4 MG tablet Take 1 tablet (4 mg total) by mouth every 6 (six) hours as needed for nausea or vomiting. 02/09/16   Emi HolesAlexandra M Law, PA-C    Family History    No family history on file.  Social History    Social History   Social History  . Marital status: Married    Spouse name: N/A  .  Number of children: N/A  . Years of education: N/A   Occupational History  . Not on file.   Social History Main Topics  . Smoking status: Current Every Day Smoker    Packs/day: 0.50    Years: 15.00    Types: Cigarettes  . Smokeless tobacco: Never Used  . Alcohol use No  . Drug use: No  . Sexual activity: Not on file   Other Topics Concern  . Not on file   Social History Narrative  . No narrative on file     Review of Systems    General:  No chills, fever, night sweats or weight changes.  Cardiovascular:  No chest pain, dyspnea on exertion, edema, orthopnea, palpitations, paroxysmal nocturnal dyspnea. Dermatological: No rash, lesions/masses Respiratory: No cough, dyspnea Urologic: No hematuria, dysuria Abdominal:   No nausea, vomiting, diarrhea, bright red blood per  rectum, melena, or hematemesis Neurologic:  No visual changes, wkns, changes in mental status. All other systems reviewed and are otherwise negative except as noted above.  Physical Exam    Blood pressure 129/84, pulse 89, temperature 98.9 F (37.2 C), temperature source Oral, resp. rate 19, height 5\' 10"  (1.778 m), weight 93 kg (205 lb), SpO2 97 %.  General: Pleasant, NAD Psych: Normal affect. Neuro: Alert and oriented X 3. Moves all extremities spontaneously. HEENT: Normal  Neck: Supple without bruits or JVD. Lungs:  Resp regular and unlabored, CTA. Heart: RRR no s3, s4, or murmurs. Abdomen: Soft, non-tender, non-distended, BS + x 4.  Extremities: No clubbing, cyanosis or edema. DP/PT/Radials 2+ and equal bilaterally.  Labs    Troponin [facility](Point of Care Test)  Recent Labs  04/09/16 1715  TROPIPOC 0.00   No results for input(s): CKTOTAL, CKMB, TROPONINI in the last 72 hours. Lab Results  Component Value Date   WBC 15.3 (H) 04/09/2016   HGB 16.4 04/09/2016   HCT 45.7 04/09/2016   MCV 95.6 04/09/2016   PLT 318 04/09/2016    Recent Labs Lab 04/09/16 1658  NA 141  K 3.5  CL 107  CO2 25  BUN 10  CREATININE 0.98  CALCIUM 9.3  GLUCOSE 124*   No results found for: CHOL, HDL, LDLCALC, TRIG No results found for: [facility]   Radiology Studies    Dg Chest Portable 1 View  Result Date: 04/09/2016 CLINICAL DATA:  Chest pain.  Supraventricular tachycardia. EXAM: PORTABLE CHEST 1 VIEW COMPARISON:  04/07/2016 FINDINGS: The heart size and mediastinal contours are within normal limits. Both lungs are clear. No evidence of pneumothorax or pleural effusion. The visualized skeletal structures are unremarkable. IMPRESSION: No active disease. Electronically Signed   By: Myles RosenthalJohn  Stahl M.D.   On: 04/09/2016 18:00    ECG & Cardiac Imaging    SVT with rate of 200. Regular. NO ST changes.   Assessment & Plan    Terrance Hodges has no sign PMH. Recent onset of paroxysmal SVT. Possible that he has  Afib as documented on previous ECG. Cant see actual ECGs to confirm. Here he is in Sinus tach. The documented episode of SVT in the ED appears to be regular. DD includes AVNRT or aflutter with 1:1 (type II flutter).   Plan: - Admit for possible ablation next week - Cont on home meds for now - hold metformin - hold on anticaog, his Chadsvasc is 1 if he truly has Afib  Signed, Macario GoldsMarat Pippa Hanif, MD 04/09/2016, 6:15 PM

## 2016-04-09 NOTE — ED Notes (Signed)
2nd unsuccessful  Attempt to give rfeport

## 2016-04-09 NOTE — Progress Notes (Signed)
Patient irate about the unit's policy for visitors spending the night in the patient's room. Visitation policy given and explained to patient and wife by primary nurse. Patient states he will leave if his wife can not stay the night because he does not like to be in the hospital and it makes him anxious. Charge nurse in to talk with patient and explained that we would make an exception for his wife to stay over night for him to be comfortable, however, she would need to step out into the waiting room during the unit shift change of [phone] and [phone] to ensure the privacy of patient information. The patient and his wife continue to be verbally aggressive toward staff and is not willing to stay despite the willingness of the unit in addressing his comfort needs. The primary nurse paged the MD but patient signed AMA paper and IV removed before MD able to see patient.

## 2016-04-09 NOTE — Progress Notes (Signed)
Pt and wife are told that it is the unit's visitor policy that pt cannot have overnight visitors under his circumstances. Pt becomes very disgruntle and situation is discussed with charge nurse. Pt is then told that an exception can be made tonight, but his wife will need to go to the lobby during change of shift times. Pt still insists on leaving. Pt will not await for his admitting doctor to be called before leaving and [facility]. Charge nurse comes to the pt's room to rationalize with pt.

## 2016-04-09 NOTE — ED Notes (Signed)
Report given to rn on 2900 

## 2016-04-09 NOTE — ED Triage Notes (Signed)
Pt brought in by family with c/o chest pain. In triage pt was found to have heart rate of 200. Per family, pt has been to [place] this month for the same symptoms. Pt is taking medications for the tachycardia without relief.

## 2016-04-09 NOTE — ED Notes (Signed)
Report attempted unsuccessful 

## 2016-04-09 NOTE — ED Provider Notes (Signed)
[facility]-EM[facility]ENCY DEPT Provider Note   CSN: [id number] Arrival date & time: 04/09/16  1651     History   Chief Complaint Chief Complaint  Patient presents with  . Tachycardia    HPI Terrance Hodges is a 40 y.o. male.   Palpitations   This is a recurrent problem. The current episode started less than 1 hour ago. The problem occurs constantly. The problem has been gradually worsening. Associated symptoms include diaphoresis, malaise/fatigue, chest pressure, irregular heartbeat and nausea. Pertinent negatives include no syncope and no shortness of breath.    Past Medical History:  Diagnosis Date  . History of hematuria   . Prostatitis   . Reflux     There are no active problems to display for this patient.   Past Surgical History:  Procedure Laterality Date  . ADENOIDECTOMY    . KNEE SURGERY    . TONSILLECTOMY    . URETER SURGERY    . WISDOM TOOTH EXTRACTION         Home Medications    Prior to Admission medications   Medication Sig Start Date End Date Taking? Authorizing Provider  naproxen (NAPROSYN) 500 MG tablet Take 1 tablet (500 mg total) by mouth 2 (two) times daily with a meal. 02/09/16   Waylan BogaAlexandra M Law, PA-C  ondansetron (ZOFRAN) 4 MG tablet Take 1 tablet (4 mg total) by mouth every 6 (six) hours as needed for nausea or vomiting. 02/09/16   Emi HolesAlexandra M Law, PA-C  oxyCODONE-acetaminophen (PERCOCET/ROXICET) 5-325 MG tablet Take 1 tablet by mouth every 6 (six) hours as needed for severe pain. 07/02/15   Carlene CoriaSerena Y Sam, PA-C    Family History No family history on file.  Social History Social History  Substance Use Topics  . Smoking status: Current Every Day Smoker    Packs/day: 0.50    Years: 15.00    Types: Cigarettes  . Smokeless tobacco: Never Used  . Alcohol use No     Allergies   Bactrim [sulfamethoxazole-trimethoprim]; Clindamycin/lincomycin; Penicillins; Pineapple; and Toradol [ketorolac tromethamine]   Review of Systems Review of Systems    Constitutional: Positive for diaphoresis and malaise/fatigue.  Respiratory: Negative for shortness of breath.   Cardiovascular: Positive for palpitations. Negative for syncope.  Gastrointestinal: Positive for nausea.  All other systems reviewed and are negative.    Physical Exam Updated Vital Signs BP 126/84   Pulse 117   Temp 98.9 F (37.2 C) (Oral)   Resp 11   Ht 5\' 10"  (1.778 m)   Wt 93 kg   SpO2 100%   BMI 29.41 kg/m   Physical Exam  Constitutional: He is oriented to person, place, and time. He appears well-developed and well-nourished. He appears distressed.  HENT:  Head: Normocephalic and atraumatic.  Eyes: Pupils are equal, round, and reactive to light.  Neck: Neck supple.  Cardiovascular: Regular rhythm.   No murmur heard. Tachycardic in 120s with sinus tach on the monitor  Pulmonary/Chest: Effort normal and breath sounds normal. No respiratory distress.  Abdominal: Soft. There is no tenderness.  Musculoskeletal: He exhibits no edema.  Neurological: He is alert and oriented to person, place, and time. No cranial nerve deficit.  Skin: Skin is warm. Capillary refill takes less than 2 seconds. He is diaphoretic.  Psychiatric: He has a normal mood and affect.  Nursing note and vitals reviewed.  ED Treatments / Results  Labs (all labs ordered are listed, but only abnormal results are displayed) Labs Reviewed  BASIC METABOLIC PANEL - Abnormal;  Notable for the following:       Result Value   Glucose, Bld 124 (*)    All other components within normal limits  CBC - Abnormal; Notable for the following:    WBC 15.3 (*)    MCH 34.3 (*)    All other components within normal limits  MRSA PCR SCREENING  RAPID URINE DRUG SCREEN, HOSP PERFORMED  BASIC METABOLIC PANEL  CBC  PROTIME-INR  I-STAT TROPOININ, ED    EKG  EKG Interpretation  Date/Time:  Saturday April 09 2016 16:55:28 EDT Ventricular Rate:  198 PR Interval:    QRS Duration: 86 QT  Interval:  232 QTC Calculation: 421 R Axis:   82 Text Interpretation:  Supraventricular tachycardia Nonspecific ST abnormality Confirmed by Denton LankSTEINL  MD, Caryn BeeKEVIN ([id number]) on 04/09/2016 6:03:56 PM       Radiology No results found.  Procedures Procedures (including critical care time)  Medications Ordered in ED Medications  adenosine (ADENOCARD) 6 MG/2ML injection 6 mg (not administered)   Initial Impression / Assessment and Plan / ED Course  I have reviewed the triage vital signs and the nursing notes.  Pertinent labs & imaging results that were available during my care of the patient were reviewed by me and considered in my medical decision making (see chart for details).  Clinical Course   Patient presents to emergency department today with a heart rate of 200 in triage.  Patient has history of SVT that has been occurring for the past month. [facility] and prescribed him flecainide 150 mg twice a day as well as Cardizem 120 mg daily.  Patient states that the frequency has been increasing considerably and today couldn't take it. Patient denies any drug use. He denies any infectious etiology. He denies any caffeine intake.  Patient intermittently goes into SVT approximately 200 bpm which responded to valsalva maneuvers.   Cardiology was consulted and will admit patient for further management and evaluation.   Final Clinical Impressions(s) / ED Diagnoses   Final diagnoses:  SVT (supraventricular tachycardia) (HCC)     Deirdre PeerJeremiah Covey Baller, MD 04/10/16 0022    Cathren LaineKevin Steinl, MD 04/10/16 1538

## 2016-05-03 ENCOUNTER — Emergency Department (HOSPITAL_BASED_OUTPATIENT_CLINIC_OR_DEPARTMENT_OTHER)
Admission: EM | Admit: 2016-05-03 | Discharge: 2016-05-03 | Disposition: A | Discharge status: Home / Self Care | Payer: BLUE CROSS/BLUE SHIELD | Attending: Emergency Medicine | Admitting: Emergency Medicine

## 2016-05-03 ENCOUNTER — Encounter (HOSPITAL_BASED_OUTPATIENT_CLINIC_OR_DEPARTMENT_OTHER): Payer: Self-pay | Admitting: *Deleted

## 2016-05-03 DIAGNOSIS — Z7984 Long term (current) use of oral hypoglycemic drugs: Secondary | ICD-10-CM | POA: Insufficient documentation

## 2016-05-03 DIAGNOSIS — L600 Ingrowing nail: Secondary | ICD-10-CM | POA: Diagnosis not present

## 2016-05-03 DIAGNOSIS — F17210 Nicotine dependence, cigarettes, uncomplicated: Secondary | ICD-10-CM | POA: Insufficient documentation

## 2016-05-03 MED ORDER — ACETAMINOPHEN 325 MG PO TABS
650.0000 mg | ORAL_TABLET | Freq: Once | ORAL | Status: AC
  Administered 2016-05-03: 650 mg via ORAL
  Filled 2016-05-03: qty 2

## 2016-05-03 MED ORDER — HYDROCODONE-ACETAMINOPHEN 5-325 MG PO TABS
1.0000 | ORAL_TABLET | Freq: Once | ORAL | Status: AC
  Administered 2016-05-03: 1 via ORAL
  Filled 2016-05-03: qty 1

## 2016-05-03 MED ORDER — LIDOCAINE-EPINEPHRINE (PF) 2 %-1:200000 IJ SOLN
20.0000 mL | Freq: Once | INTRAMUSCULAR | Status: AC
  Administered 2016-05-03: 20 mL
  Filled 2016-05-03: qty 20

## 2016-05-03 NOTE — ED Notes (Signed)
Ambulatory at d/c. Work note given. D/c home with family to drive

## 2016-05-03 NOTE — ED Provider Notes (Signed)
[facility]   CSN: [id number] Arrival date & time: 05/03/16  1020     History   Chief Complaint Chief Complaint  Patient presents with  . Ingrown Toenail    HPI Terrance MesaJamie E Kohlmeyer is a 40 y.o. male.  HPI Patient reports persistent and an increasing pain to his right great toe over the past several days.  He believes he has ingrown toenail.  He feels like there is a small amount of inflammation and redness of the great toe as well.  His pain is moderate in severity and worse with palpation of his toe.   Past Medical History:  Diagnosis Date  . History of hematuria   . Prostatitis   . Reflux     Patient Active Problem List   Diagnosis Date Noted  . SVT (supraventricular tachycardia) (HCC) 04/09/2016    Past Surgical History:  Procedure Laterality Date  . ADENOIDECTOMY    . KNEE SURGERY    . TONSILLECTOMY    . URETER SURGERY    . WISDOM TOOTH EXTRACTION         Home Medications    Prior to Admission medications   Medication Sig Start Date End Date Taking? Authorizing Provider  diltiazem (CARDIZEM CD) 120 MG 24 hr capsule Take 120 mg by mouth daily.    Historical Provider, MD  flecainide (TAMBOCOR) 150 MG tablet Take 150 mg by mouth 2 (two) times daily.    Historical Provider, MD  metformin (FORTAMET) 500 MG (OSM) 24 hr tablet Take 500 mg by mouth daily with breakfast.    Historical Provider, MD  naproxen (NAPROSYN) 500 MG tablet Take 1 tablet (500 mg total) by mouth 2 (two) times daily with a meal. 02/09/16   Waylan BogaAlexandra M Law, PA-C  ondansetron (ZOFRAN) 4 MG tablet Take 1 tablet (4 mg total) by mouth every 6 (six) hours as needed for nausea or vomiting. 02/09/16   Emi HolesAlexandra M Law, PA-C  oxyCODONE-acetaminophen (PERCOCET/ROXICET) 5-325 MG tablet Take 1 tablet by mouth every 6 (six) hours as needed for severe pain. 07/02/15   Carlene CoriaSerena Y Sam, PA-C    Family History History reviewed. No pertinent family history.  Social History Social History    Substance Use Topics  . Smoking status: Current Every Day Smoker    Packs/day: 0.50    Years: 15.00    Types: Cigarettes  . Smokeless tobacco: Never Used  . Alcohol use No     Allergies   Bactrim [sulfamethoxazole-trimethoprim]; Clindamycin/lincomycin; Penicillins; Pineapple; and Toradol [ketorolac tromethamine]   Review of Systems Review of Systems  All other systems reviewed and are negative.    Physical Exam Updated Vital Signs BP 152/86 (BP Location: Left Arm)   Pulse 108   Temp 97.8 F (36.6 C) (Oral)   Resp 18   Ht 5\' 10"  (1.778 m)   Wt 205 lb (93 kg)   SpO2 100%   BMI 29.41 kg/m   Physical Exam  Constitutional: He is oriented to person, place, and time. He appears well-developed and well-nourished.  HENT:  Head: Normocephalic.  Eyes: EOM are normal.  Neck: Normal range of motion.  Pulmonary/Chest: Effort normal.  Abdominal: He exhibits no distension.  Musculoskeletal: Normal range of motion.  Ingrown toenail of the right great toe on the medial aspect. Tubular shaped nails at baseline  Neurological: He is alert and oriented to person, place, and time.  Psychiatric: He has a normal mood and affect.  Nursing note and vitals reviewed.  ED Treatments / Results  Labs (all labs ordered are listed, but only abnormal results are displayed) Labs Reviewed - No data to display  EKG  EKG Interpretation None       Radiology No results found.  Procedures .Nerve Block Performed by: Azalia BilisAMPOS, Scorpio Fortin Authorized by: Azalia BilisAMPOS, Ewin Rehberg   Consent:    Consent obtained:  Verbal   Consent given by:  Patient Indications:    Indications:  Procedural anesthesia Location:    Body area:  Lower extremity   Laterality:  Right Pre-procedure details:    Preparation: Patient was prepped and draped in usual sterile fashion   Procedure details (see MAR for exact dosages):    Block needle gauge:  24 G   Anesthetic injected:  Lidocaine 2% WITH epi   Injection  procedure:  Anatomic landmarks identified and introduced needle Post-procedure details:    Dressing:  None   Outcome:  Anesthesia achieved   Patient tolerance of procedure:  Tolerated well, no immediate complications .Nail Removal Performed by: Azalia BilisAMPOS, Felicidad Sugarman Authorized by: Azalia BilisAMPOS, Shronda Boeh   Consent:    Consent obtained:  Verbal   Consent given by:  Patient Location:    Foot:  R big toe Pre-procedure details:    Skin preparation:  Betadine   Preparation: Patient was prepped and draped in the usual sterile fashion   Anesthesia (see MAR for exact dosages):    Anesthesia method:  Nerve block   Block location:  SEE NOTE Ingrown nail:    Wedge excision of skin: yes     Nail matrix removed or ablated:  Partial Post-procedure details:    Dressing:  Antibiotic ointment   Patient tolerance of procedure:  Tolerated well, no immediate complications   (including critical care time)  Medications Ordered in ED Medications  lidocaine-EPINEPHrine (XYLOCAINE W/EPI) 2 %-1:200000 (PF) injection 20 mL (not administered)     Initial Impression / Assessment and Plan / ED Course  I have reviewed the triage vital signs and the nursing notes.  Pertinent labs & imaging results that were available during my care of the patient were reviewed by me and considered in my medical decision making (see chart for details).  Clinical Course     Patient given the option of conservative measures for his ingrown toenail versus toenail removal.  He chooses removal.  We will remove 1/6 of the nail here.  No signs of infection.  Patient will need follow-up with the podiatrist.  Final Clinical Impressions(s) / ED Diagnoses   Final diagnoses:  Ingrown toenail without infection    New Prescriptions New Prescriptions   No medications on file     Azalia BilisKevin Aldrick Derrig, MD 05/03/16 1124

## 2016-05-03 NOTE — ED Triage Notes (Signed)
Pt reports ingrown toenail to right great toe, redness noted.

## 2016-05-10 ENCOUNTER — Ambulatory Visit: Payer: Self-pay | Admitting: Podiatry

## 2016-06-24 ENCOUNTER — Emergency Department (HOSPITAL_BASED_OUTPATIENT_CLINIC_OR_DEPARTMENT_OTHER)
Admission: EM | Admit: 2016-06-24 | Discharge: 2016-06-24 | Disposition: A | Discharge status: Home / Self Care | Payer: BLUE CROSS/BLUE SHIELD | Attending: Emergency Medicine | Admitting: Emergency Medicine

## 2016-06-24 ENCOUNTER — Emergency Department (HOSPITAL_BASED_OUTPATIENT_CLINIC_OR_DEPARTMENT_OTHER): Payer: BLUE CROSS/BLUE SHIELD

## 2016-06-24 ENCOUNTER — Encounter (HOSPITAL_BASED_OUTPATIENT_CLINIC_OR_DEPARTMENT_OTHER): Payer: Self-pay

## 2016-06-24 DIAGNOSIS — F17210 Nicotine dependence, cigarettes, uncomplicated: Secondary | ICD-10-CM | POA: Insufficient documentation

## 2016-06-24 DIAGNOSIS — M62830 Muscle spasm of back: Secondary | ICD-10-CM | POA: Diagnosis not present

## 2016-06-24 DIAGNOSIS — Z79899 Other long term (current) drug therapy: Secondary | ICD-10-CM | POA: Insufficient documentation

## 2016-06-24 DIAGNOSIS — W19XXXA Unspecified fall, initial encounter: Secondary | ICD-10-CM

## 2016-06-24 DIAGNOSIS — S3992XA Unspecified injury of lower back, initial encounter: Secondary | ICD-10-CM | POA: Diagnosis present

## 2016-06-24 DIAGNOSIS — Y929 Unspecified place or not applicable: Secondary | ICD-10-CM | POA: Diagnosis not present

## 2016-06-24 DIAGNOSIS — M545 Low back pain, unspecified: Secondary | ICD-10-CM

## 2016-06-24 DIAGNOSIS — M5136 Other intervertebral disc degeneration, lumbar region: Secondary | ICD-10-CM | POA: Insufficient documentation

## 2016-06-24 DIAGNOSIS — Y939 Activity, unspecified: Secondary | ICD-10-CM | POA: Diagnosis not present

## 2016-06-24 DIAGNOSIS — Y999 Unspecified external cause status: Secondary | ICD-10-CM | POA: Diagnosis not present

## 2016-06-24 DIAGNOSIS — W109XXA Fall (on) (from) unspecified stairs and steps, initial encounter: Secondary | ICD-10-CM | POA: Diagnosis not present

## 2016-06-24 DIAGNOSIS — M47817 Spondylosis without myelopathy or radiculopathy, lumbosacral region: Secondary | ICD-10-CM

## 2016-06-24 MED ORDER — CYCLOBENZAPRINE HCL 10 MG PO TABS: 10.0000 mg | ORAL_TABLET | Freq: Three times a day (TID) | ORAL | 0 refills | Status: DC | PRN

## 2016-06-24 MED ORDER — OXYCODONE-ACETAMINOPHEN 5-325 MG PO TABS: 1.0000 | ORAL_TABLET | Freq: Four times a day (QID) | ORAL | 0 refills | Status: DC | PRN

## 2016-06-24 MED ORDER — HYDROCODONE-ACETAMINOPHEN 5-325 MG PO TABS: 1.0000 | ORAL_TABLET | Freq: Four times a day (QID) | ORAL | 0 refills | Status: DC | PRN

## 2016-06-24 MED ORDER — NAPROXEN 250 MG PO TABS
500.0000 mg | ORAL_TABLET | Freq: Once | ORAL | Status: AC
  Administered 2016-06-24: 500 mg via ORAL
  Filled 2016-06-24: qty 2

## 2016-06-24 MED ORDER — NAPROXEN 500 MG PO TABS: 500.0000 mg | ORAL_TABLET | Freq: Two times a day (BID) | ORAL | 0 refills | Status: DC | PRN

## 2016-06-24 NOTE — ED Provider Notes (Signed)
[facility]   CSN: [id number] Arrival date & time: 06/24/16  1331  By signing my name below, I, Terrance Hodges, attest that this documentation has been prepared under the direction and in the presence of Kennett Symes Camprubi-Soms, PA. Electronically Signed: Alyssa GroveMartin Hodges, ED Scribe. 06/24/16. 4:26 PM.  History   Chief Complaint Chief Complaint  Patient presents with  . Fall    The history is provided by the patient and medical records. No language interpreter was used.  Fall  This is a new problem. The current episode started 6 to 12 hours ago. The problem occurs constantly. The problem has not changed since onset.Pertinent negatives include no chest pain, no abdominal pain and no shortness of breath. The symptoms are aggravated by standing. The symptoms are relieved by acetaminophen, NSAIDs and heat. He has tried acetaminophen and a warm compress for the symptoms. The treatment provided mild relief.   HPI Comments: Terrance MesaJamie E Hodges is a 41 y.o. male who presents to the Emergency Department complaining of lower back pain s/p slipping and falling down ~7 steps this morning around 9 AM after missing a step. He describes his pain as 7/10 constant dull to stabbing lower back pain that radiates to the bilateral hips and up his spine that is exacerbated with standing and sitting. Pt has taken Tylenol, Ibuprofen, and use heat with mild relief. Pt states he has a slight tingling sensation in the 1st and 2nd toes on the right foot. Denies head inj/[name], CP, SOB, abd pain, N/V, incontinence of urine/stool, saddle anesthesia/cauda equina symptoms, myalgias, arthralgias, numbness, weakness, bruises, lacerations, or any other complaints at this time. Pt is not currently taking any blood thinners. He reports an allergic reaction of hives and vomiting with Toradol. He is driving today.   Past Medical History:  Diagnosis Date  . History of hematuria   . Prostatitis   . Reflux     Patient  Active Problem List   Diagnosis Date Noted  . SVT (supraventricular tachycardia) (HCC) 04/09/2016    Past Surgical History:  Procedure Laterality Date  . ADENOIDECTOMY    . KNEE SURGERY    . TONSILLECTOMY    . URETER SURGERY    . WISDOM TOOTH EXTRACTION       Home Medications    Prior to Admission medications   Medication Sig Start Date End Date Taking? Authorizing Provider  OMEPRAZOLE PO Take by mouth.   Yes Historical Provider, MD  diltiazem (CARDIZEM CD) 120 MG 24 hr capsule Take 120 mg by mouth daily.    Historical Provider, MD  flecainide (TAMBOCOR) 150 MG tablet Take 150 mg by mouth 2 (two) times daily.    Historical Provider, MD  metformin (FORTAMET) 500 MG (OSM) 24 hr tablet Take 500 mg by mouth daily with breakfast.    Historical Provider, MD    Family History No family history on file.  Social History Social History  Substance Use Topics  . Smoking status: Current Every Day Smoker    Packs/day: 0.50    Years: 15.00    Types: Cigarettes  . Smokeless tobacco: Never Used  . Alcohol use No     Allergies   Bactrim [sulfamethoxazole-trimethoprim]; Clindamycin/lincomycin; Penicillins; Pineapple; and Toradol [ketorolac tromethamine]   Review of Systems Review of Systems  HENT: Negative for facial swelling (no head inj).   Respiratory: Negative for shortness of breath.   Cardiovascular: Negative for chest pain.  Gastrointestinal: Negative for abdominal pain, nausea and vomiting.  Genitourinary:  Negative for difficulty urinating (no incontinence) and dysuria.  Musculoskeletal: Positive for back pain. Negative for arthralgias and myalgias.  Skin: Negative for color change and wound.  Allergic/Immunologic: Negative for immunocompromised state.  Neurological: Negative for syncope, weakness and numbness.  Hematological: Does not bruise/bleed easily.  Psychiatric/Behavioral: Negative for confusion.  10 Systems reviewed and are negative for acute change except as  noted in the HPI.   Physical Exam Updated Vital Signs BP 129/81 (BP Location: Right Arm)   Pulse 86   Temp 98.4 F (36.9 C) (Oral)   Resp 18   Ht 5\' 10"  (1.778 m)   Wt 205 lb (93 kg)   SpO2 100%   BMI 29.41 kg/m   Physical Exam  Constitutional: He is oriented to person, place, and time. Vital signs are normal. He appears well-developed and well-nourished.  Non-toxic appearance. No distress.  Afebrile, nontoxic, NAD  HENT:  Head: Normocephalic and atraumatic.  Mouth/Throat: Mucous membranes are normal.  Eyes: Conjunctivae and EOM are normal. Right eye exhibits no discharge. Left eye exhibits no discharge.  Neck: Normal range of motion. Neck supple.  Cardiovascular: Normal rate and intact distal pulses.   Pulmonary/Chest: Effort normal. No respiratory distress.  Abdominal: Normal appearance. He exhibits no distension.  Musculoskeletal: Normal range of motion.       Lumbar back: He exhibits tenderness, bony tenderness and spasm. He exhibits normal range of motion and no deformity.  Lumbar spine with FROM intact with mild midline spinous process TTP, no bony stepoffs or deformities, with mild bilateral paraspinous muscle TTP and muscle spasms. Strength and sensation grossly intact in all extremities, negative SLR bilaterally, gait steady and nonantalgic. No overlying skin changes. Distal pulses intact.  Neurological: He is alert and oriented to person, place, and time. He has normal strength. No sensory deficit.  Skin: Skin is warm, dry and intact. No rash noted.  Psychiatric: He has a normal mood and affect.  Nursing note and vitals reviewed.    ED Treatments / Results  DIAGNOSTIC STUDIES: Oxygen Saturation is 100% on RA, normal by my interpretation.    COORDINATION OF CARE: 4:22 PM Discussed treatment plan with pt at bedside which includes DG Lumbar Spine and pt agreed to plan.  Labs (all labs ordered are listed, but only abnormal results are displayed) Labs Reviewed - No  data to display  EKG  EKG Interpretation None       Radiology Dg Lumbar Spine Complete  Result Date: 06/24/2016 CLINICAL DATA:  Low back pain due to a slip and fall today. Initial encounter. EXAM: LUMBAR SPINE - COMPLETE 4+ VIEW COMPARISON:  CT abdomen and pelvis 11/19/2015. FINDINGS: There is no evidence of lumbar spine fracture. Alignment is normal. Intervertebral disc spaces are maintained. There is some facet degenerative change at L5-S1. IMPRESSION: No acute abnormality.  Facet degenerative disease L5-S1 noted. Electronically Signed   By: Drusilla Kanner M.D.   On: 06/24/2016 16:50    Procedures Procedures (including critical care time)  Medications Ordered in ED Medications  naproxen (NAPROSYN) tablet 500 mg (500 mg Oral Given 06/24/16 1628)     Initial Impression / Assessment and Plan / ED Course  I have reviewed the triage vital signs and the nursing notes.  Pertinent labs & imaging results that were available during my care of the patient were reviewed by me and considered in my medical decision making (see chart for details).    41 y.o. male here with back pain after mechanical slip and fall;  No red flag s/s of low back pain. No s/s of central cord compression or cauda equina. Lower extremities are neurovascularly intact and patient is ambulating without difficulty. Mild tenderness to midline spinous processes and paraspinous muscles; pt driving so can't get narcotics/muscle relaxers here but will give naprosyn for now since pt can't take toradol. Will obtain xray and reassess shortly.   5:50 PM Xray neg aside from facet degenerative findings at L5-S1 area which is likely contributing to pain but overall seems likely to be a muscle strain/contusion. Doubt need for further emergent work up at this time. Patient was counseled on back pain precautions and told to do activity as tolerated but do not lift, push, or pull heavy objects more than 10 pounds for the next week.  Patient counseled to use ice or heat on back for no longer than 15 minutes every hour.   [place] reviewed prior to dispensing controlled substance medications, and was notable for: vicodin #16 tabs on 02/11/16, and percocet #90 tabs 02/11/16 Risks/benefits/alternatives and expectations discussed regarding controlled substances. Side effects of medications discussed. Informed consent obtained.   Rx given for muscle relaxer and counseled on proper use of muscle relaxant medication. Rx given for narcotic pain medicine and counseled on proper use of narcotic pain medications. Told that they can increase to every 4 hrs if needed while pain is worse. Counseled not to combine this medication with others containing tylenol. Urged patient not to drink alcohol, drive, or perform any other activities that requires focus while taking either of these medications. Naprosyn rx given as well.  Patient urged to follow-up with PCP if pain does not improve with treatment and rest or if pain becomes recurrent. Urged to return with worsening severe pain, loss of bowel or bladder control, trouble walking. The patient verbalizes understanding and agrees with the plan.   I personally performed the services described in this documentation, which was scribed in my presence. The recorded information has been reviewed and is accurate.   Final Clinical Impressions(s) / ED Diagnoses   Final diagnoses:  Fall, initial encounter  Acute midline low back pain without sciatica  Muscle spasm of back  Facet arthritis, degenerative, L5-S1 level, lumbosacral spine    New Prescriptions New Prescriptions   CYCLOBENZAPRINE (FLEXERIL) 10 MG TABLET    Take 1 tablet (10 mg total) by mouth 3 (three) times daily as needed for muscle spasms.   HYDROCODONE-ACETAMINOPHEN (NORCO) 5-325 MG TABLET    Take 1 tablet by mouth every 6 (six) hours as needed for severe pain.   NAPROXEN (NAPROSYN) 500 MG TABLET    Take 1 tablet (500 mg total) by  mouth 2 (two) times daily as needed for mild pain, moderate pain or headache (TAKE WITH MEALS.).      [address], PA-C 06/24/16 1751    Lavera Guise, MD 06/25/16 [id number]

## 2016-06-24 NOTE — ED Notes (Signed)
Patient able to void without difficulty.

## 2016-06-24 NOTE — Discharge Instructions (Signed)
Back Pain: °Your back pain should be treated with medicines such as ibuprofen or aleve and this back pain should get better over the next 2 weeks.  However if you develop severe or worsening pain, low back pain with fever, numbness, weakness or inability to walk or urinate, you should return to the ER immediately.  Please follow up with your doctor this week for a recheck if still having symptoms.  Avoid heavy lifting over 10 pounds over the next two weeks. ° °Low back pain is discomfort in the lower back that may be due to injuries to muscles and ligaments around the spine.  Occasionally, it may be caused by a a problem to a part of the spine called a disc.  The pain may last several days or a week;  However, most patients get completely well in 4 weeks. ° °Self - care:  The application of heat can help soothe the pain.  Maintaining your daily activities, including walking, is encourged, as it will help you get better faster than just staying in bed. Perform gentle stretching as discussed. Drink plenty of fluids. ° °Medications are also useful to help with pain control.  A commonly prescribed medication includes norco.  Do not drive or operate heavy machinery while taking this medication. ° °Non steroidal anti inflammatory medications including Ibuprofen and naproxen;  These medications help both pain and swelling and are very useful in treating back pain.  They should be taken with food, as they can cause stomach upset, and more seriously, stomach bleeding.   ° °Muscle relaxants:  These medications can help with muscle tightness that is a cause of lower back pain.  Most of these medications can cause drowsiness, and it is not safe to drive or use dangerous machinery while taking them. ° °SEEK IMMEDIATE MEDICAL ATTENTION IF: °New numbness, tingling, weakness, or problem with the use of your arms or legs.  °Severe back pain not relieved with medications.  °Difficulty with or loss of control of your bowel or bladder  control.  °Increasing pain in any areas of the body (such as chest or abdominal pain).  °Shortness of breath, dizziness or fainting.  °Nausea (feeling sick to your stomach), vomiting, fever, or sweats. ° °You will need to follow up with  Your primary healthcare provider in 1-2 weeks for reassessment. °

## 2016-06-24 NOTE — ED Notes (Addendum)
Patient reports falling this morning and injuring his lower back.  Denies [name], head injury. Reports the pain radiates up his back.  Patient ambulatory to room without difficulty.

## 2016-06-24 NOTE — ED Triage Notes (Signed)
Slipped/fell this am-pain to lower back-NAD-steady gait

## 2016-07-04 ENCOUNTER — Encounter (HOSPITAL_BASED_OUTPATIENT_CLINIC_OR_DEPARTMENT_OTHER): Payer: Self-pay | Admitting: *Deleted

## 2016-07-04 ENCOUNTER — Emergency Department (HOSPITAL_BASED_OUTPATIENT_CLINIC_OR_DEPARTMENT_OTHER)
Admission: EM | Admit: 2016-07-04 | Discharge: 2016-07-04 | Disposition: A | Discharge status: Home / Self Care | Payer: BLUE CROSS/BLUE SHIELD | Attending: Emergency Medicine | Admitting: Emergency Medicine

## 2016-07-04 DIAGNOSIS — F17210 Nicotine dependence, cigarettes, uncomplicated: Secondary | ICD-10-CM | POA: Diagnosis not present

## 2016-07-04 DIAGNOSIS — M545 Low back pain: Secondary | ICD-10-CM | POA: Diagnosis present

## 2016-07-04 DIAGNOSIS — Z79899 Other long term (current) drug therapy: Secondary | ICD-10-CM | POA: Diagnosis not present

## 2016-07-04 DIAGNOSIS — L0390 Cellulitis, unspecified: Secondary | ICD-10-CM | POA: Insufficient documentation

## 2016-07-04 DIAGNOSIS — B356 Tinea cruris: Secondary | ICD-10-CM

## 2016-07-04 HISTORY — DX: Supraventricular tachycardia: I47.1

## 2016-07-04 HISTORY — DX: Supraventricular tachycardia, unspecified: I47.10

## 2016-07-04 HISTORY — DX: Unspecified atrial fibrillation: I48.91

## 2016-07-04 LAB — URINALYSIS, ROUTINE W REFLEX MICROSCOPIC
Bilirubin Urine: NEGATIVE
Glucose, UA: NEGATIVE mg/dL
KETONES UR: NEGATIVE mg/dL
LEUKOCYTES UA: NEGATIVE
Nitrite: NEGATIVE
PROTEIN: NEGATIVE mg/dL
Specific Gravity, Urine: 1.024 (ref 1.005–1.030)
pH: 6 (ref 5.0–8.0)

## 2016-07-04 LAB — URINALYSIS, MICROSCOPIC (REFLEX)

## 2016-07-04 MED ORDER — DOXYCYCLINE HYCLATE 100 MG PO CAPS: 100.0000 mg | ORAL_CAPSULE | Freq: Two times a day (BID) | ORAL | 0 refills | Status: DC

## 2016-07-04 MED ORDER — CLOTRIMAZOLE 1 % EX CREA
TOPICAL_CREAM | CUTANEOUS | 0 refills | Status: DC
Start: 2016-07-04 — End: 2016-09-28

## 2016-07-04 MED ORDER — FLUCONAZOLE 200 MG PO TABS: 200.0000 mg | ORAL_TABLET | Freq: Every day | ORAL | 0 refills | Status: AC

## 2016-07-04 MED ORDER — OXYCODONE-ACETAMINOPHEN 5-325 MG PO TABS: 1.0000 | ORAL_TABLET | Freq: Four times a day (QID) | ORAL | 0 refills | Status: DC | PRN

## 2016-07-04 NOTE — Discharge Instructions (Signed)
Return if any problems.  See your Physician for recheck  °

## 2016-07-04 NOTE — ED Triage Notes (Signed)
C/o back pain x 1 week after he fell off his back steps and was seen here last week for same and now has raw open area to right groin since yesterday with foul odor. C/o pain with urination and denies d/c from penis.

## 2016-07-04 NOTE — ED Provider Notes (Signed)
[facility] Provider Note   CSN: [id number] Arrival date & time: 07/04/16  1510 By signing my name below, I, Bridgette Habermann, attest that this documentation has been prepared under the direction and in the presence of Langston Masker, [place]. Electronically Signed: Bridgette Habermann, ED Scribe. 07/04/16. 4:39 PM.  History   Chief Complaint Chief Complaint  Patient presents with  . Groin Pain    HPI The history is provided by the patient. No language interpreter was used.   HPI Comments: Terrance Hodges is a 41 y.o. male with h/o A-fib, SVT, and prostatitis, who presents to the Emergency Department complaining of a moderate, gradually worsening area of pain and erythema with raw open area to the right groin onset yesterday. Pt notes there is occasionally a foul odor to the area. Pt states pain is exacerbated with palpation and direct pressure. Denies drainage from the area.   Additionally, pt is complaining of worsening lower back pain following a mechanical fall one week ago. Pt was seen for this last week for the same symptoms and was given Naproxen, Oxycodone, and Flexeril with minimal relief. Pt denies fever, chills, or any other associated symptoms.  Past Medical History:  Diagnosis Date  . A-fib (HCC)   . History of hematuria   . Prostatitis   . Reflux   . SVT (supraventricular tachycardia) [facility])     Patient Active Problem List   Diagnosis Date Noted  . SVT (supraventricular tachycardia) (HCC) 04/09/2016    Past Surgical History:  Procedure Laterality Date  . ADENOIDECTOMY    . KNEE SURGERY    . TONSILLECTOMY    . URETER SURGERY    . WISDOM TOOTH EXTRACTION         Home Medications    Prior to Admission medications   Medication Sig Start Date End Date Taking? Authorizing Provider  diltiazem (CARDIZEM CD) 120 MG 24 hr capsule Take 120 mg by mouth daily.   Yes Historical Provider, MD  flecainide (TAMBOCOR) 150 MG tablet Take 150 mg by mouth 2 (two) times daily.   Yes  Historical Provider, MD  metformin (FORTAMET) 500 MG (OSM) 24 hr tablet Take 500 mg by mouth daily with breakfast.   Yes Historical Provider, MD  OMEPRAZOLE PO Take by mouth.   Yes Historical Provider, MD  naproxen (NAPROSYN) 500 MG tablet Take 1 tablet (500 mg total) by mouth 2 (two) times daily as needed for mild pain, moderate pain or headache (TAKE WITH MEALS.). 06/24/16   Mercedes [address]pp [address], PA-C  oxyCODONE-acetaminophen (PERCOCET) 5-325 MG tablet Take 1 tablet by mouth every 6 (six) hours as needed for severe pain. 06/24/16   Mercedes Strupp Street, PA-C    Family History No family history on file.  Social History Social History  Substance Use Topics  . Smoking status: Current Every Day Smoker    Packs/day: 0.50    Years: 15.00    Types: Cigarettes  . Smokeless tobacco: Never Used  . Alcohol use No     Allergies   Bactrim [sulfamethoxazole-trimethoprim]; Clindamycin/lincomycin; Penicillins; Pineapple; Toradol [ketorolac tromethamine]; and Vicodin [hydrocodone-acetaminophen]   Review of Systems Review of Systems  Constitutional: Negative for chills and fever.  Musculoskeletal: Positive for back pain.  Skin: Positive for color change and wound.  All other systems reviewed and are negative.  Physical Exam Updated Vital Signs BP 139/88 (BP Location: Right Arm)   Pulse 103   Temp 98.5 F (36.9 C) (Oral)   Resp 22   Ht  5\' 10"  (1.778 m)   Wt 205 lb (93 kg)   SpO2 100%   BMI 29.41 kg/m   Physical Exam  Constitutional: He appears well-developed and well-nourished.  HENT:  Head: Normocephalic.  Eyes: Conjunctivae are normal.  Cardiovascular: Normal rate.   Pulmonary/Chest: Effort normal. No respiratory distress.  Abdominal: He exhibits no distension.  Musculoskeletal: Normal range of motion.  Red area to right groin, fungal-ish odor. Tenderness to palpation.  Neurological: He is alert.  Skin: Skin is warm and dry.  Psychiatric: He has a normal mood and affect.  His behavior is normal.  Nursing note and vitals reviewed.    ED Treatments / Results  DIAGNOSTIC STUDIES: Oxygen Saturation is 100% on RA, normal by my interpretation.    COORDINATION OF CARE: 4:38 PM Discussed treatment plan with pt at bedside and pt agreed to plan.  Labs (all labs ordered are listed, but only abnormal results are displayed) Labs Reviewed  URINALYSIS, ROUTINE W REFLEX MICROSCOPIC - Abnormal; Notable for the following:       Result Value   Hgb urine dipstick LARGE (*)    All other components within normal limits  URINALYSIS, MICROSCOPIC (REFLEX) - Abnormal; Notable for the following:    Bacteria, UA RARE (*)    Squamous Epithelial / LPF 0-5 (*)    All other components within normal limits    EKG  EKG Interpretation None       Radiology No results found.  Procedures Procedures (including critical care time)  Medications Ordered in ED Medications - No data to display   Initial Impression / Assessment and Plan / ED Course  I have reviewed the triage vital signs and the nursing notes.  Pertinent labs & imaging results that were available during my care of the patient were reviewed by me and considered in my medical decision making (see chart for details).       Final Clinical Impressions(s) / ED Diagnoses   Final diagnoses:  Tinea cruris  Cellulitis, unspecified cellulitis site   Appears to be tinea with secondary bacterial infection.   New Prescriptions Discharge Medication List as of 07/04/2016  4:47 PM    START taking these medications   Details  clotrimazole (LOTRIMIN) 1 % cream Apply to affected area 2 times daily, Print    doxycycline (VIBRAMYCIN) 100 MG capsule Take 1 capsule (100 mg total) by mouth 2 (two) times daily., Starting Mon 07/04/2016, Print    fluconazole (DIFLUCAN) 200 MG tablet Take 1 tablet (200 mg total) by mouth daily., Starting Mon 07/04/2016, Until Mon 07/11/2016, Print      An After Visit Summary was printed and  given to the patient.  [name], which was scribed in my presence.  The recorded information has been reviewed and considered.   Barnet PallKaren SofiaPAC.    Lonia SkinnerLeslie K NewburgSofia, PA-C 07/05/16 0100    Vanetta MuldersScott Zackowski, MD 07/07/16 [phone]

## 2016-09-13 ENCOUNTER — Other Ambulatory Visit: Payer: Self-pay | Admitting: Orthopedic Surgery

## 2016-09-14 ENCOUNTER — Other Ambulatory Visit: Payer: Self-pay | Admitting: Orthopedic Surgery

## 2016-09-14 DIAGNOSIS — M545 Low back pain: Secondary | ICD-10-CM

## 2016-09-16 ENCOUNTER — Telehealth: Payer: Self-pay | Admitting: *Deleted

## 2016-09-16 NOTE — Telephone Encounter (Signed)
NOTES SENT TO SCHEDULING.  °

## 2016-09-28 ENCOUNTER — Ambulatory Visit (INDEPENDENT_AMBULATORY_CARE_PROVIDER_SITE_OTHER): Payer: BLUE CROSS/BLUE SHIELD | Admitting: Cardiology

## 2016-09-28 ENCOUNTER — Encounter: Payer: Self-pay | Admitting: Cardiology

## 2016-09-28 ENCOUNTER — Ambulatory Visit
Admission: RE | Admit: 2016-09-28 | Discharge: 2016-09-28 | Disposition: A | Discharge status: Home / Self Care | Payer: BLUE CROSS/BLUE SHIELD | Source: Ambulatory Visit | Attending: Orthopedic Surgery | Admitting: Orthopedic Surgery

## 2016-09-28 VITALS — BP 136/74 | HR 108 | Ht 69.0 in | Wt 206.4 lb

## 2016-09-28 DIAGNOSIS — I471 Supraventricular tachycardia: Secondary | ICD-10-CM | POA: Diagnosis not present

## 2016-09-28 DIAGNOSIS — M545 Low back pain: Secondary | ICD-10-CM

## 2016-09-28 MED ORDER — DILTIAZEM HCL ER COATED BEADS 360 MG PO CP24: 360.0000 mg | ORAL_CAPSULE | Freq: Every day | ORAL | 2 refills | Status: DC

## 2016-09-28 NOTE — Patient Instructions (Signed)
Medication Instructions: Increase Diltiazem to 360 mg once daily  Labwork: None ordered  Procedures/Testing: None ordered  Follow-Up: Your physician recommends that you schedule a follow-up appointment in:6 months with Dr. Elberta Fortis  Any Additional Special Instructions Will Be Listed Below (If Applicable).     If you need a refill on your cardiac medications before your next appointment, please call your pharmacy.

## 2016-09-28 NOTE — Progress Notes (Signed)
Electrophysiology Office Note   Date:  09/28/2016   ID:  Terrance Hodges, DOB 12-21-1975, MRN [id number]  PCP:  No PCP Per Patient  Primary Electrophysiologist:  Will Jorja Loa, MD    Chief Complaint  Patient presents with  . Advice Only     History of Present Illness: Terrance Hodges is a 41 y.o. male who is being seen today for the evaluation of SVT and atrial fibrillation at the request of No ref. provider found. Presenting today for electrophysiology evaluation.  He was seen in the emergency room on 04/09/16 for an episode of SVT with heart rates of 200. SVT responded to Valsalva maneuvers and converted to sinus rhythm. He also has a history of atrial fibrillation. He apparently presented to [facility] with palpitations. This occurred in November. He has had an echocardiogram and a stress test; we are trying to obtain those records.    Today, he denie symptoms of palpitations, chest pain, shortness of breath, orthopnea, PND, lower extremity edema, claudication, dizziness, presyncope, syncope, bleeding, or neurologic sequela. The patient is tolerating medications without difficulties.    Past Medical History:  Diagnosis Date  . A-fib (HCC)   . History of hematuria   . Prostatitis   . Reflux   . SVT (supraventricular tachycardia) (HCC)    Past Surgical History:  Procedure Laterality Date  . ADENOIDECTOMY    . KNEE SURGERY    . TONSILLECTOMY    . URETER SURGERY    . WISDOM TOOTH EXTRACTION       Current Outpatient Prescriptions  Medication Sig Dispense Refill  . diltiazem (CARDIZEM CD) 360 MG 24 hr capsule Take 1 capsule (360 mg total) by mouth daily. 30 capsule 2  . flecainide (TAMBOCOR) 150 MG tablet Take 150 mg by mouth 2 (two) times daily.    [name] oxyCODONE-acetaminophen (PERCOCET) 5-325 MG tablet Take 1 tablet by mouth every 6 (six) hours as needed for severe pain. 16 tablet 0   No current facility-administered medications for this visit.     Allergies:    Bactrim [sulfamethoxazole-trimethoprim]; Clindamycin/lincomycin; Penicillins; Pineapple; Toradol [ketorolac tromethamine]; and Vicodin [hydrocodone-acetaminophen]   Social History:  The patient  reports that he has been smoking Cigarettes.  He has a 7.50 pack-year smoking history. He has never used smokeless tobacco. He reports that he does not drink alcohol or use drugs.   Family History:  The patient's family history includes Heart attack in his father; Hypertension in his mother.    ROS:  Please see the history of present illness.   Otherwise, review of systems is positive for palpitations.   All other systems are reviewed and negative.    PHYSICAL EXAM: VS:  BP 136/74   Pulse (!) 108   Ht  (1.753 m)   Wt 206 lb 6.4 oz (93.6 kg)   BMI 30.48 kg/m  , BMI Body mass index is 30.48 kg/m. GEN: Well nourished, well developed, in no acute distress  HEENT: normal  Neck: no JVD, carotid bruits, or masses Cardiac: RRR; no murmurs, rubs, or gallops,no edema  Respiratory:  clear to auscultation bilaterally, normal work of breathing GI: soft, nontender, nondistended, + BS MS: no deformity or atrophy  Skin: warm and dry Neuro:  Strength and sensation are intact Psych: euthymic mood, full affect  EKG:  EKG is sinus tachycardia, rate 108 ordered today. Personal review of the ekg ordered shows sinus tachycardia, rate 108  Recent Labs: 02/09/2016: ALT 25 04/09/2016: BUN 10;  Creatinine, Ser 0.98; Hemoglobin 16.4; Platelets 318; Potassium 3.5; Sodium 141    Lipid Panel  No results found for: CHOL, TRIG, HDL, CHOLHDL, VLDL, LDLCALC, LDLDIRECT   Wt Readings from Last 3 Encounters:  09/28/16 206 lb 6.4 oz (93.6 kg)  07/04/16 205 lb (93 kg)  06/24/16 205 lb (93 kg)      Other studies Reviewed: Additional studies/ records that were reviewed today include: Primary physician outside records  ASSESSMENT AND PLAN:  1.  SVT: Seen on EKG from 04/09/17. 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 He also has a vague history of atrial fibrillation, but I have yet to see an EKG that shows this rhythm. Discussed the options for therapy for his SVT including medications versus ablation. He is feeling well on his current medications. Will not plan for ablation at this time.  2. Atrial fibrillation: At this time we do not have records of his atrial fibrillation. He is on diltiazem 120 mg twice a day, as well as 150 mg twice a day flecainide. He is in sinus rhythm today, though he is quite tachycardic. We'll adjust his diltiazem and to 360 mg daily. He does not require anticoagulation as his stroke risk is low.  This patients CHA2DS2-VASc Score and unadjusted Ischemic Stroke Rate (% per year) is equal to 0.2 % stroke rate/year from a score of 0  Above score calculated as 1 point each if present [CHF, HTN, DM, Vascular=MI/PAD/Aortic Plaque, Age if 65-74, or Male] Above score calculated as 2 points each if present [Age > 75, or Stroke/TIA/TE]   Current medicines are reviewed at length with the patient today.   The patient does not have concerns regarding his medicines.  The following changes were made today:  Increase diltiazem to 360 mg daily  Labs/ tests ordered today include:  Orders Placed This Encounter  Procedures  . EKG 12-Lead     Disposition:   FU with Will Camnitz 6 months  Signed, Will Jorja Loa, MD  09/28/2016 3:23 PM     [facility] [address] [place] [address] [phone] (office) [phone] (fax)

## 2016-10-09 ENCOUNTER — Encounter (HOSPITAL_BASED_OUTPATIENT_CLINIC_OR_DEPARTMENT_OTHER): Payer: Self-pay | Admitting: *Deleted

## 2016-10-09 ENCOUNTER — Emergency Department (HOSPITAL_BASED_OUTPATIENT_CLINIC_OR_DEPARTMENT_OTHER)
Admission: EM | Admit: 2016-10-09 | Discharge: 2016-10-09 | Disposition: A | Discharge status: Home / Self Care | Payer: BLUE CROSS/BLUE SHIELD | Attending: Dermatology | Admitting: Dermatology

## 2016-10-09 DIAGNOSIS — Z5321 Procedure and treatment not carried out due to patient leaving prior to being seen by health care provider: Secondary | ICD-10-CM | POA: Diagnosis not present

## 2016-10-09 DIAGNOSIS — Z79899 Other long term (current) drug therapy: Secondary | ICD-10-CM | POA: Diagnosis not present

## 2016-10-09 DIAGNOSIS — R51 Headache: Secondary | ICD-10-CM | POA: Diagnosis not present

## 2016-10-09 DIAGNOSIS — F17210 Nicotine dependence, cigarettes, uncomplicated: Secondary | ICD-10-CM | POA: Diagnosis not present

## 2016-10-09 NOTE — ED Notes (Signed)
Call x 2 no answer

## 2016-10-09 NOTE — ED Triage Notes (Signed)
Pt reports L facial pain that began today. Reports chills and sweats and fatigue. Denies cough/cold symptoms.

## 2016-12-12 ENCOUNTER — Other Ambulatory Visit: Payer: Self-pay | Admitting: Cardiology

## 2016-12-12 MED ORDER — DILTIAZEM HCL ER COATED BEADS 360 MG PO CP24: 360.0000 mg | ORAL_CAPSULE | Freq: Every day | ORAL | 2 refills | Status: DC

## 2017-01-16 ENCOUNTER — Other Ambulatory Visit: Payer: Self-pay | Admitting: *Deleted

## 2017-01-16 MED ORDER — FLECAINIDE ACETATE 150 MG PO TABS: 150.0000 mg | ORAL_TABLET | Freq: Two times a day (BID) | ORAL | 2 refills | Status: DC

## 2017-01-16 NOTE — Telephone Encounter (Signed)
Ok to fill through November

## 2017-01-16 NOTE — Telephone Encounter (Signed)
Patients wife called and requested a refill on flecainide for the patient. Dr Elberta Fortisamnitz has never filled this for him. Okay to refill? Please advise. Thanks, MI

## 2017-02-22 ENCOUNTER — Telehealth: Payer: Self-pay | Admitting: Cardiology

## 2017-02-22 NOTE — Telephone Encounter (Signed)
New message    Pt wife calling concerned about pt HR due to increased after colonscopy on 02/22/17. Prior to procedure HR 97. Wife states continues to increase each day.

## 2017-02-22 NOTE — Telephone Encounter (Signed)
OK to speak with wife, per verbal ok from patient.  Last two weeks HRs have been increasing. Reports upper 100s. Vagal maneuvers working sometimes but mostly they won't. Almost feels like it was before he started medication. Symptoms include: weakness/fatigue/worn out easily/SOB/sweating This morning pt had colonoscopy & EGD.  HR went up during procedure and Metoprolol given to patient. Will discuss with Dr. Elberta Fortis and call back with plan. Wife is agreeable and understands I will call her back this afternoon.

## 2017-02-22 NOTE — Telephone Encounter (Signed)
Advised to start Metoprolol 25 mg BID. Wife tells me that pt does not tolerate Metoprolol -- states that every time he takes that he passes out.  Will re-review with Dr. Elberta Fortis tomorrow and call her then. She is agreeable to plan.

## 2017-02-23 MED ORDER — CARVEDILOL 6.25 MG PO TABS: 6.2500 mg | ORAL_TABLET | Freq: Two times a day (BID) | ORAL | 3 refills | Status: DC

## 2017-02-23 NOTE — Telephone Encounter (Signed)
Advised to try a different beta blocker called Carvedilol 6.25 mg BID. Rx sent to [facility] Advised to contact the office if this does not help w/ HR and/or BPs drop too low and/or pt is symptomatic. Wife verbalized understanding and agreeable to plan - she will pass information along to pt.

## 2017-03-09 ENCOUNTER — Telehealth: Payer: Self-pay | Admitting: Cardiology

## 2017-03-09 MED ORDER — CARVEDILOL 6.25 MG PO TABS: 6.2500 mg | ORAL_TABLET | Freq: Two times a day (BID) | ORAL | 3 refills | Status: DC

## 2017-03-09 NOTE — Telephone Encounter (Signed)
Sent new rx to [facility] per pt request.

## 2017-03-09 NOTE — Telephone Encounter (Signed)
New message     Pt c/o medication issue:  1. Name of Medication: carvedilol   2. How are you currently taking this medication (dosage and times per day)?  2x a day 3. Are you having a reaction (difficulty breathing--STAT)? no  4. What is your medication issue? His follow up appt is 10/31 - and they only called in a 2 week supply please call

## 2017-03-14 ENCOUNTER — Other Ambulatory Visit: Payer: Self-pay | Admitting: *Deleted

## 2017-03-14 MED ORDER — CARVEDILOL 6.25 MG PO TABS: 6.2500 mg | ORAL_TABLET | Freq: Two times a day (BID) | ORAL | 0 refills | Status: DC

## 2017-03-23 ENCOUNTER — Telehealth: Payer: Self-pay | Admitting: Cardiology

## 2017-03-23 ENCOUNTER — Other Ambulatory Visit: Payer: Self-pay

## 2017-03-23 MED ORDER — CARVEDILOL 6.25 MG PO TABS: 6.2500 mg | ORAL_TABLET | Freq: Two times a day (BID) | ORAL | 0 refills | Status: DC

## 2017-03-23 NOTE — Telephone Encounter (Signed)
New message      *STAT* If patient is at the pharmacy, call can be transferred to refill team.   1. Which medications need to be refilled? (please list name of each medication and dose if known) carvedilol 6.25mg    2. Which pharmacy/location (including street and city if local pharmacy) is medication to be sent to[facility] [address] [facility] [place]. Do they need a 30 day or 90 day supply? 30

## 2017-03-23 NOTE — Progress Notes (Signed)
One month supply of carvedilol sent to pharmacy for pt. Additional refills will need to be discussed at follow up appointment on 10/31.

## 2017-03-23 NOTE — Telephone Encounter (Signed)
This is a [facility] pt 

## 2017-04-04 NOTE — Progress Notes (Deleted)
Electrophysiology Office Note   Date:  04/04/2017   ID:  Terrance Hodges, DOB [birth year], MRN [id number]  PCP:  Patient, No Pcp Per  Primary Electrophysiologist:  Evelisse Szalkowski Jorja LoaMartin Aleigh Grunden, MD    No chief complaint on file.    History of Present Illness: Terrance Hodges is a 41 y.o. male who is being seen today for the evaluation of SVT and atrial fibrillation at the request of No ref. provider found. Presenting today for electrophysiology evaluation.  He was seen in the emergency room on 04/09/16 for an episode of SVT with heart rates of 200. SVT responded to Valsalva maneuvers and converted to sinus rhythm. He also has a history of atrial fibrillation. He apparently presented to [facility] with palpitations. This occurred in November. He has had an echocardiogram and a stress test; we are trying to obtain those records.  Today, denies symptoms of palpitations, chest pain, shortness of breath, orthopnea, PND, lower extremity edema, claudication, dizziness, presyncope, syncope, bleeding, or neurologic sequela. The patient is tolerating medications without difficulties. ***    Past Medical History:  Diagnosis Date  . A-fib (HCC)   . History of hematuria   . Prostatitis   . Reflux   . SVT (supraventricular tachycardia) (HCC)    Past Surgical History:  Procedure Laterality Date  . ADENOIDECTOMY    . KNEE SURGERY    . TONSILLECTOMY    . URETER SURGERY    . WISDOM TOOTH EXTRACTION       Current Outpatient Prescriptions  Medication Sig Dispense Refill  . carvedilol (COREG) 6.25 MG tablet Take 1 tablet (6.25 mg total) by mouth 2 (two) times daily. 60 tablet 0  . diltiazem (CARDIZEM CD) 360 MG 24 hr capsule Take 1 capsule (360 mg total) by mouth daily. 90 capsule 2  . flecainide (TAMBOCOR) 150 MG tablet Take 1 tablet (150 mg total) by mouth 2 (two) times daily. 60 tablet 2  . oxyCODONE-acetaminophen (PERCOCET) 5-325 MG tablet Take 1 tablet by mouth every 6 (six) hours as needed for  severe pain. 16 tablet 0  . ranitidine (ZANTAC) 300 MG capsule Take 300 mg by mouth 2 (two) times daily.     No current facility-administered medications for this visit.     Allergies:   Bactrim [sulfamethoxazole-trimethoprim]; Clindamycin/lincomycin; Penicillins; Pineapple; Toradol [ketorolac tromethamine]; and Vicodin [hydrocodone-acetaminophen]   Social History:  The patient  reports that he has been smoking Cigarettes.  He has a 7.50 pack-year smoking history. He has never used smokeless tobacco. He reports that he does not drink alcohol or use drugs.   Family History:  The patient's family history includes Heart attack in his father; Hypertension in his mother.    ROS:  Please see the history of present illness.   Otherwise, review of systems is positive for ***.   All other systems are reviewed and negative.   PHYSICAL EXAM: VS:  There were no vitals taken for this visit. , BMI There is no height or weight on file to calculate BMI. GEN: Well nourished, well developed, in no acute distress  HEENT: normal  Neck: no JVD, carotid bruits, or masses Cardiac: ***RRR; no murmurs, rubs, or gallops,no edema  Respiratory:  clear to auscultation bilaterally, normal work of breathing GI: soft, nontender, nondistended, + BS MS: no deformity or atrophy  Skin: warm and dry Neuro:  Strength and sensation are intact Psych: euthymic mood, full affect  EKG:  EKG {ACTION; IS/IS [id number]} ordered today. Personal review of  the ekg ordered *** shows ***   Recent Labs: 04/09/2016: BUN 10; Creatinine, Ser 0.98; Hemoglobin 16.4; Platelets 318; Potassium 3.5; Sodium 141    Lipid Panel  No results found for: CHOL, TRIG, HDL, CHOLHDL, VLDL, LDLCALC, LDLDIRECT   Wt Readings from Last 3 Encounters:  10/09/16 205 lb (93 kg)  09/28/16 206 lb 6.4 oz (93.6 kg)  07/04/16 205 lb (93 kg)      Other studies Reviewed: Additional studies/ records that were reviewed today include: Primary physician  outside records  ASSESSMENT AND PLAN:  1.  SVT: Seen on EKG from 04/09/17. 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 He also has a vague history of atrial fibrillation, but I have yet to see an EKG that shows this rhythm. Discussed the options for therapy for his SVT including medications versus ablation. He is feeling well on his current medications. Dechelle Attaway not plan for ablation at this time.***  2. Atrial fibrillation: At this time we do not have records of his atrial fibrillation. He is on diltiazem 120 mg twice a day, as well as 150 mg twice a day flecainide. He is in sinus rhythm today, though he is quite tachycardic. We'll adjust his diltiazem and to 360 mg daily. He does not require anticoagulation as his stroke risk is low.***  This patients CHA2DS2-VASc Score and unadjusted Ischemic Stroke Rate (% per year) is equal to 0.2 % stroke rate/year from a score of 0  Above score calculated as 1 point each if present [CHF, HTN, DM, Vascular=MI/PAD/Aortic Plaque, Age if 65-74, or Male] Above score calculated as 2 points each if present [Age > 75, or Stroke/TIA/TE]  Current medicines are reviewed at length with the patient today.   The patient does not have concerns regarding his medicines.  The following changes were made today:  ***  Labs/ tests ordered today include:  No orders of the defined types were placed in this encounter.    Disposition:   FU with Khylie Larmore *** months  Signed, Marry Kusch Jorja Loa, MD  04/04/2017 3:02 PM     [facility] [address] [place] [address] [phone] (office) [phone] (fax)

## 2017-04-05 ENCOUNTER — Ambulatory Visit: Payer: BLUE CROSS/BLUE SHIELD | Admitting: Cardiology

## 2017-04-09 ENCOUNTER — Other Ambulatory Visit: Payer: Self-pay | Admitting: Cardiology

## 2017-04-13 ENCOUNTER — Other Ambulatory Visit: Payer: Self-pay | Admitting: Cardiology

## 2017-04-13 MED ORDER — FLECAINIDE ACETATE 150 MG PO TABS: 150.0000 mg | ORAL_TABLET | Freq: Two times a day (BID) | ORAL | 1 refills | Status: DC

## 2017-04-26 ENCOUNTER — Ambulatory Visit: Payer: BLUE CROSS/BLUE SHIELD | Admitting: Cardiology

## 2017-07-10 ENCOUNTER — Other Ambulatory Visit: Payer: Self-pay | Admitting: Cardiology

## 2017-07-10 MED ORDER — CARVEDILOL 6.25 MG PO TABS: 6.2500 mg | ORAL_TABLET | Freq: Two times a day (BID) | ORAL | 0 refills | Status: DC

## 2017-07-21 ENCOUNTER — Ambulatory Visit: Payer: BLUE CROSS/BLUE SHIELD | Admitting: Cardiology

## 2017-08-11 ENCOUNTER — Ambulatory Visit: Payer: BLUE CROSS/BLUE SHIELD | Admitting: Cardiology

## 2017-08-24 ENCOUNTER — Other Ambulatory Visit: Payer: Self-pay | Admitting: Cardiology

## 2017-08-24 MED ORDER — FLECAINIDE ACETATE 150 MG PO TABS: 150.0000 mg | ORAL_TABLET | Freq: Two times a day (BID) | ORAL | 1 refills | Status: DC

## 2017-08-26 IMAGING — CT CT RENAL STONE PROTOCOL
2 of 4 series · 16 of 46 positions shown, 18 images · non-contrast
Comparison: 08/27/2015

CLINICAL DATA: Bilateral flank pain for 6 days, nausea, 1 episode
of vomiting, greater left flank pain

EXAM:
CT ABDOMEN AND PELVIS WITHOUT CONTRAST
TECHNIQUE: Multidetector CT imaging of the abdomen and pelvis was performed
following the standard protocol without IV contrast.

[Series 2: axial st · axial · 0.80mm/px · z∈[-648,-178]mm · 13 of 104 slices shown, 15 images]
[im 5/104  soft-tissue]
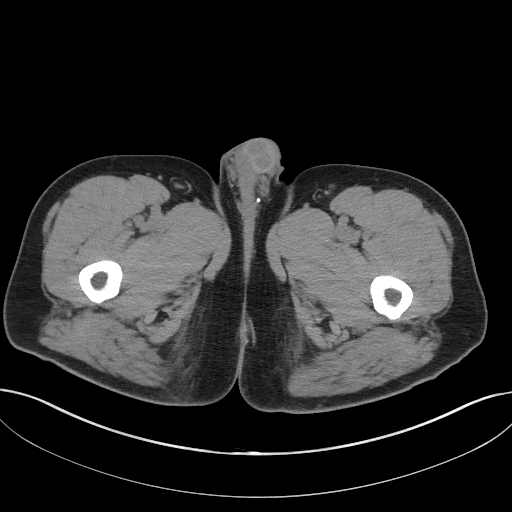
[im 5/104  bone]
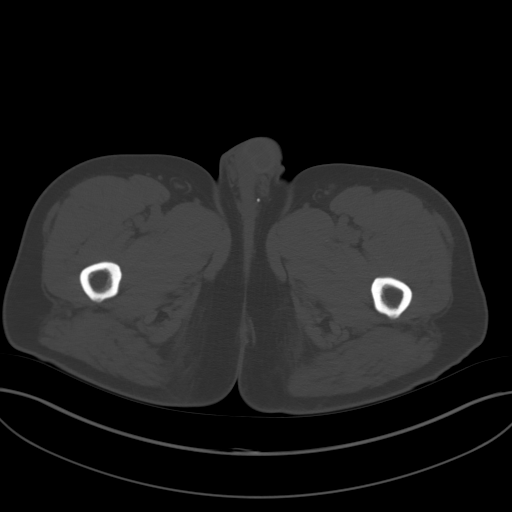
[im 13/104  soft-tissue]
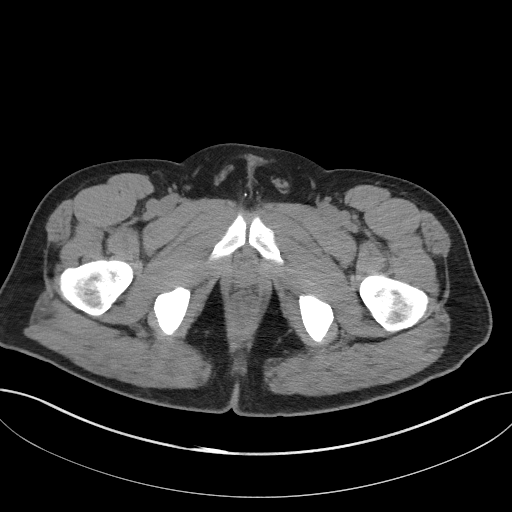
[im 21/104  soft-tissue]
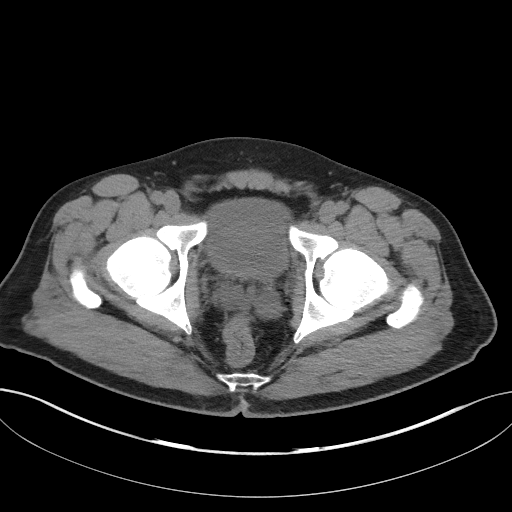
[im 29/104  soft-tissue]
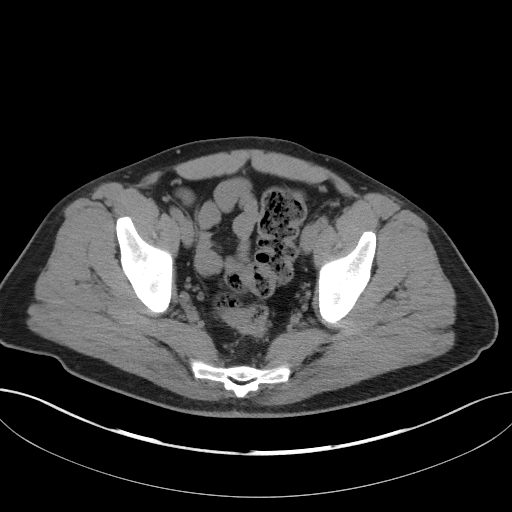
[im 38/104  soft-tissue]
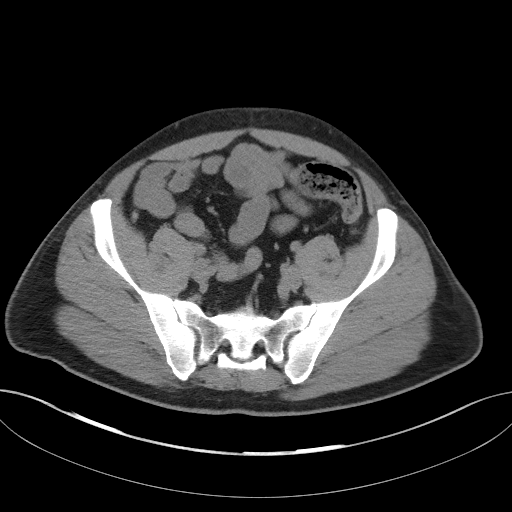
[im 46/104  soft-tissue]
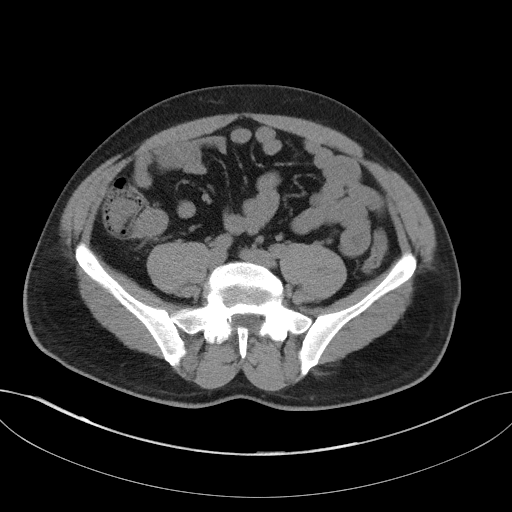
[im 54/104  soft-tissue]
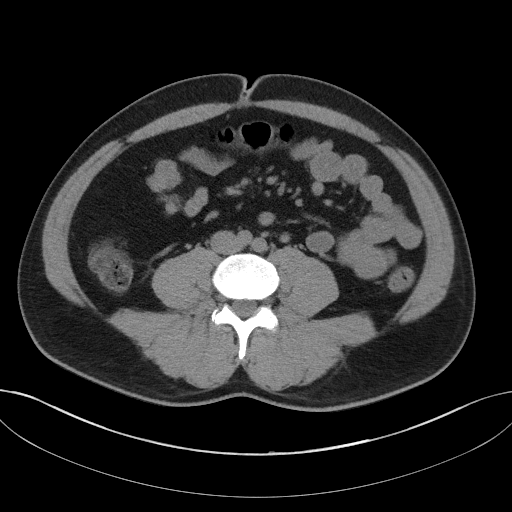
[im 58/104  soft-tissue]
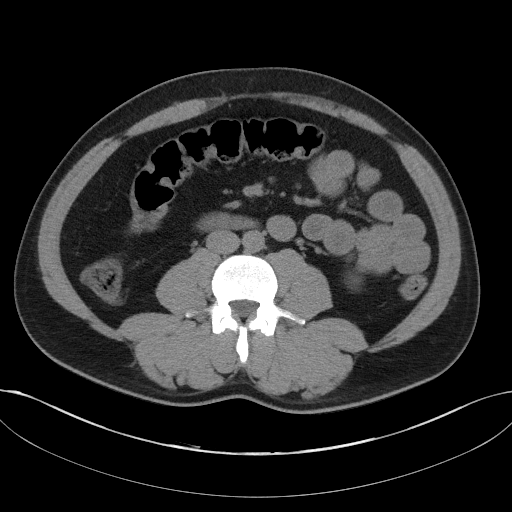
[im 66/104  soft-tissue]
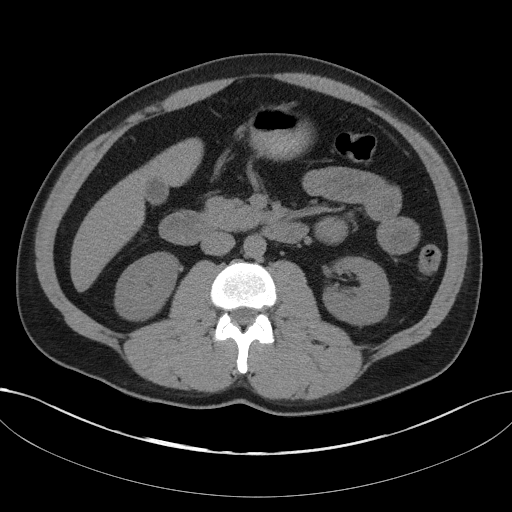
[im 66/104  bone]
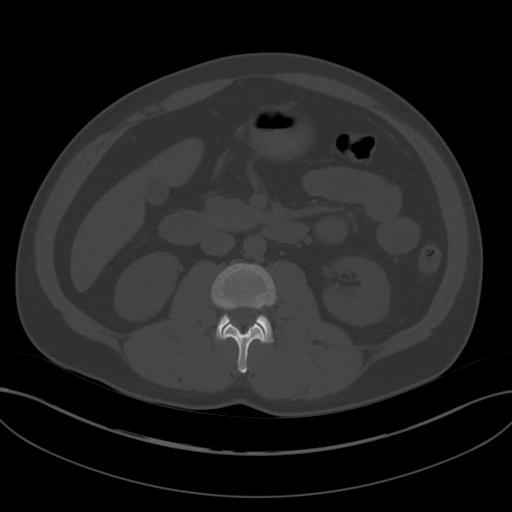
[im 75/104  soft-tissue]
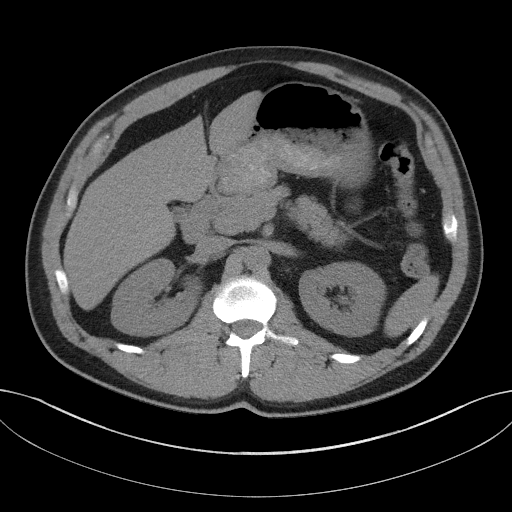
[im 83/104  soft-tissue]
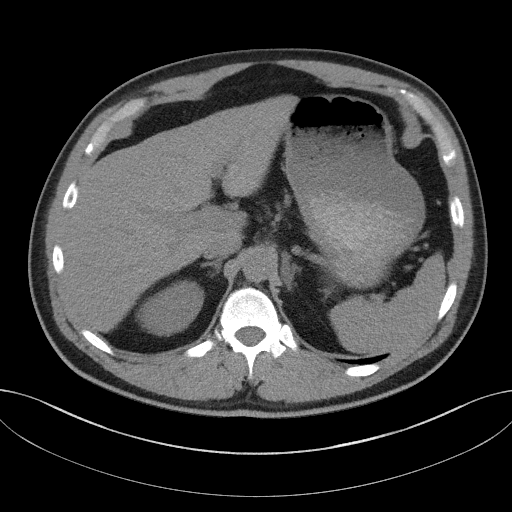
[im 91/104  soft-tissue]
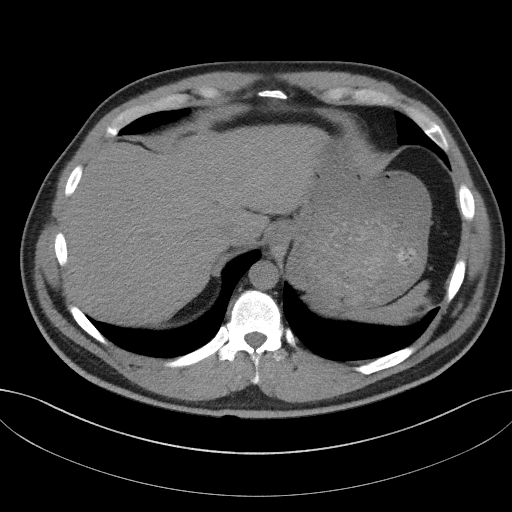
[im 99/104  soft-tissue]
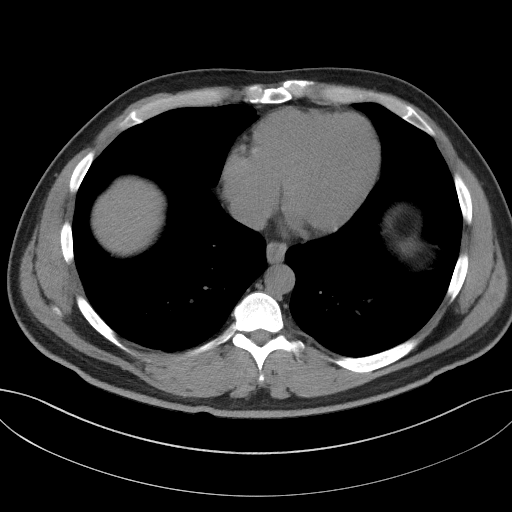

[Series 4: coronal st · coronal · 0.84mm/px · 3 of 105 slices shown]
[im 35/105  soft-tissue]
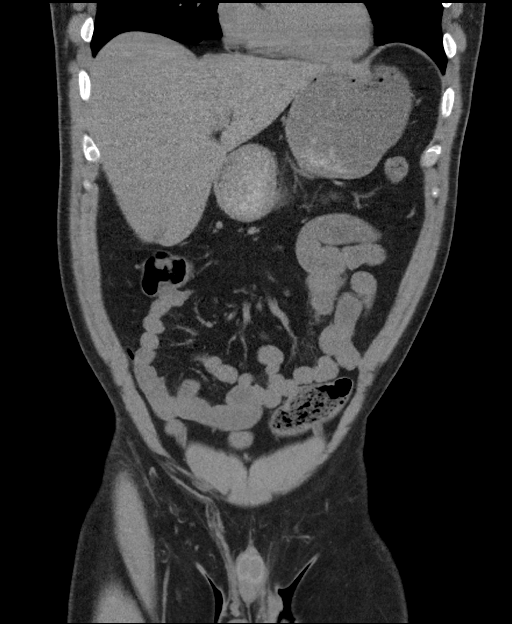
[im 47/105  soft-tissue]
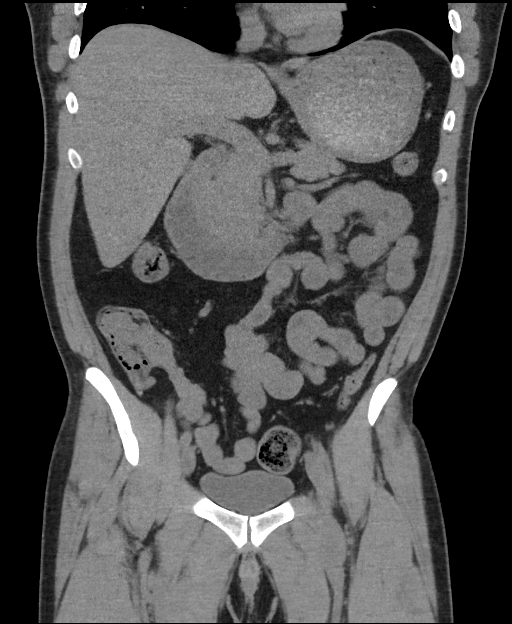
[im 58/105  soft-tissue]
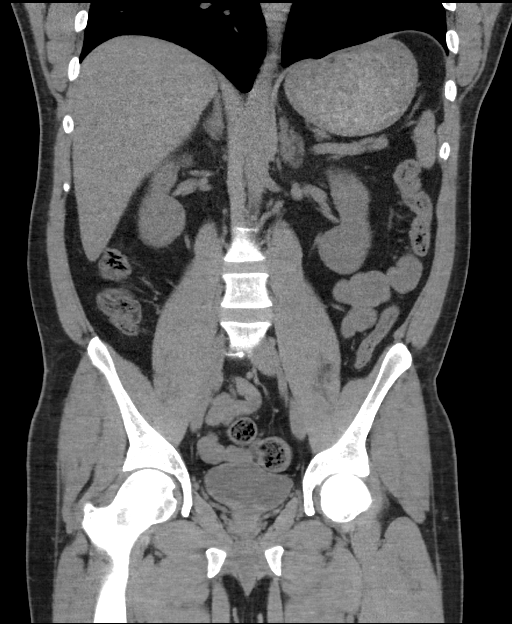

[16 of 46 positions shown; findings below may reference images not displayed]

FINDINGS: Lower chest:  The lung bases are unremarkable.

Hepatobiliary: Unenhanced liver shows no biliary ductal dilatation.
The gallbladder is contracted without evidence of calcified
gallstones.

Pancreas: Unenhanced pancreas is unremarkable.

Spleen: Unenhanced spleen is unremarkable.

Adrenals/Urinary Tract: Stable mild thickening of left adrenal
gland. No adrenal gland mass is noted. Unenhanced kidneys are
symmetrical in size. No nephrolithiasis. No hydronephrosis or
hydroureter. No calcified ureteral calculi. No calcified calculi are
noted within urinary bladder.

Stomach/Bowel: There is no small bowel obstruction. No thickened or
dilated small bowel loops. The terminal ileum is unremarkable.
Normal appendix is noted in axial image 67. No pericecal
inflammation. Some colonic gas noted in transverse colon. Moderate
stool noted within proximal sigmoid colon. No evidence of colitis or
diverticulitis. No distal colonic obstruction.

Vascular/Lymphatic: No aortic aneurysm. No retroperitoneal or
mesenteric adenopathy.

Reproductive: No pelvic mass. Prostate gland and seminal vesicles
are unremarkable.

Other: No ascites or free abdominal air.

Musculoskeletal: No destructive bony lesions are noted. Sagittal
images of the spine are unremarkable.
IMPRESSION: 1. There is no evidence of nephrolithiasis. No hydronephrosis or
hydroureter.
2. No calcified ureteral calculi are noted.
3. No calcified calculi are noted within urinary bladder.
4. Normal appendix.  No pericecal inflammation.
5. No small bowel obstruction.

## 2017-09-04 ENCOUNTER — Ambulatory Visit: Payer: BLUE CROSS/BLUE SHIELD | Admitting: Cardiology

## 2017-10-06 ENCOUNTER — Other Ambulatory Visit: Payer: Self-pay | Admitting: Cardiology

## 2017-10-06 MED ORDER — DILTIAZEM HCL ER COATED BEADS 360 MG PO CP24: 360.0000 mg | ORAL_CAPSULE | Freq: Every day | ORAL | 0 refills | Status: DC

## 2017-10-22 ENCOUNTER — Other Ambulatory Visit: Payer: Self-pay | Admitting: Cardiology

## 2017-11-01 ENCOUNTER — Other Ambulatory Visit: Payer: Self-pay | Admitting: Cardiology

## 2017-11-02 ENCOUNTER — Ambulatory Visit (INDEPENDENT_AMBULATORY_CARE_PROVIDER_SITE_OTHER): Payer: BLUE CROSS/BLUE SHIELD | Admitting: Cardiology

## 2017-11-02 ENCOUNTER — Encounter: Payer: Self-pay | Admitting: Cardiology

## 2017-11-02 VITALS — BP 114/68 | HR 71 | Ht 70.0 in | Wt 211.8 lb

## 2017-11-02 DIAGNOSIS — R600 Localized edema: Secondary | ICD-10-CM

## 2017-11-02 DIAGNOSIS — I471 Supraventricular tachycardia: Secondary | ICD-10-CM | POA: Diagnosis not present

## 2017-11-02 DIAGNOSIS — I480 Paroxysmal atrial fibrillation: Secondary | ICD-10-CM | POA: Diagnosis not present

## 2017-11-02 NOTE — Progress Notes (Signed)
Electrophysiology Office Note   Date:  11/02/2017   ID:  Terrance Hodges, DOB 28-Jun-1975, MRN [id number]  PCP:  Patient, No Pcp Per  Primary Electrophysiologist:  Tuff Clabo Jorja Loa, MD    Chief Complaint  Patient presents with  . Supraventricular tachycardia     History of Present Illness: Terrance Hodges is a 42 y.o. male who is being seen today for the evaluation of SVT and atrial fibrillation at the request of No ref. provider found. Presenting today for electrophysiology evaluation.  He was seen in the emergency room on 04/09/16 for an episode of SVT with heart rates of 200. SVT responded to Valsalva maneuvers and converted to sinus rhythm. He also has a history of atrial fibrillation. He apparently presented to [facility] with palpitations. This occurred in November. He has had an echocardiogram and a stress test; we are trying to obtain those records.  Today, denies symptoms of palpitations, chest pain, shortness of breath, orthopnea, PND, lower extremity edema, claudication, dizziness, presyncope, syncope, bleeding, or neurologic sequela. The patient is tolerating medications without difficulties.  Overall he is doing well.  He has had no further episodes of palpitations.  He has noted leg swelling.  Swelling happens more towards the end of the day, but this has occurred more often over the last few weeks.  Swelling is improved with raising his legs.   Past Medical History:  Diagnosis Date  . A-fib (HCC)   . History of hematuria   . Prostatitis   . Reflux   . SVT (supraventricular tachycardia) (HCC)    Past Surgical History:  Procedure Laterality Date  . ADENOIDECTOMY    . KNEE SURGERY    . TONSILLECTOMY    . URETER SURGERY    . WISDOM TOOTH EXTRACTION       Current Outpatient Medications  Medication Sig Dispense Refill  . carvedilol (COREG) 6.25 MG tablet Take 1 tablet (6.25 mg total) by mouth 2 (two) times daily. Please keep upcoming appt with Dr. Elberta Fortis.  Final Attempt 20 tablet 0  . dexlansoprazole (DEXILANT) 60 MG capsule Take 60 mg by mouth daily.    [name] diltiazem (CARDIZEM CD) 360 MG 24 hr capsule Take 1 capsule (360 mg total) by mouth daily. Please make overdue yearly appt with Dr. Elberta Fortis before anymore refills. 1st attempt 30 capsule 0  . flecainide (TAMBOCOR) 150 MG tablet Take 1 tablet (150 mg total) by mouth 2 (two) times daily. Please keep upcoming appt for future refills. Thank you 180 tablet 1  . omega-3 acid ethyl esters (LOVAZA) 1 g capsule Take 2 capsules by mouth 2 (two) times daily.  3  . oxyCODONE-acetaminophen (PERCOCET) 5-325 MG tablet Take 1 tablet by mouth every 6 (six) hours as needed for severe pain. 16 tablet 0   No current facility-administered medications for this visit.     Allergies:   Bactrim [sulfamethoxazole-trimethoprim]; Clindamycin/lincomycin; Penicillins; Pineapple; Toradol [ketorolac tromethamine]; and Vicodin [hydrocodone-acetaminophen]   Social History:  The patient  reports that he has been smoking cigarettes.  He has a 7.50 pack-year smoking history. He has never used smokeless tobacco. He reports that he does not drink alcohol or use drugs.   Family History:  The patient's family history includes Heart attack in his father; Hypertension in his mother.    ROS:  Please see the history of present illness.   Otherwise, review of systems is positive for leg swelling, hearing loss, blood in urine, difficulty urinating, back pain, dizziness.  All other systems are reviewed and negative.   PHYSICAL EXAM: VS:  BP 114/68   Pulse 71   Ht  (1.778 m)   Wt 211 lb 12.8 oz (96.1 kg)   BMI 30.39 kg/m  , BMI Body mass index is 30.39 kg/m. GEN: Well nourished, well developed, in no acute distress  HEENT: normal  Neck: no JVD, carotid bruits, or masses Cardiac: RRR; no murmurs, rubs, or gallops,no edema  Respiratory:  clear to auscultation bilaterally, normal work of breathing GI: soft, nontender,  nondistended, + BS MS: no deformity or atrophy  Skin: warm and dry Neuro:  Strength and sensation are intact Psych: euthymic mood, full affect  EKG:  EKG is ordered today. Personal review of the ekg ordered shows SR, 1dAVB, rate 71   Recent Labs: No results found for requested labs within last 8760 hours.    Lipid Panel  No results found for: CHOL, TRIG, HDL, CHOLHDL, VLDL, LDLCALC, LDLDIRECT   Wt Readings from Last 3 Encounters:  11/02/17 211 lb 12.8 oz (96.1 kg)  10/09/16 205 lb (93 kg)  09/28/16 206 lb 6.4 oz (93.6 kg)      Other studies Reviewed: Additional studies/ records that were reviewed today include: Primary physician outside records  ASSESSMENT AND PLAN:  1.  SVT: Seen on EKG from 04/09/2017.  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  He has been put on flecainide and diltiazem which is greatly improved his symptoms.  He has not had a recurrence.  He would prefer to continue medical management at this time and is not interested in ablation.    2. Atrial fibrillation: Do not have records of atrial fibrillation.  He is currently on flecainide and diltiazem.  No further recurrences.  No changes.  This patients CHA2DS2-VASc Score and unadjusted Ischemic Stroke Rate (% per year) is equal to 0.2 % stroke rate/year from a score of 0  Above score calculated as 1 point each if present [CHF, HTN, DM, Vascular=MI/PAD/Aortic Plaque, Age if 65-74, or Male] Above score calculated as 2 points each if present [Age > 75, or Stroke/TIA/TE]  3.  Leg swelling: He is currently being worked up for prostate issues by his urologist.  Per the patient, his kidney function has been checked and is somewhat normal.  We Amara Manalang check a BNP to further determine if this is cardiac in nature.   Current medicines are reviewed at length with the patient today.   The patient does not have concerns regarding his medicines.  The following changes were made today: None  Labs/  tests ordered today include:  Orders Placed This Encounter  Procedures  . Pro b natriuretic peptide (BNP)  . EKG 12-Lead     Disposition:   FU with Burton Gahan 12 months  Signed, Arnoldo Hildreth Jorja Loa, MD  11/02/2017 11:54 AM     [facility] [address] [place] [address] [phone] (office) [phone] (fax)

## 2017-11-02 NOTE — Patient Instructions (Signed)
Medication Instructions:  Your physician recommends that you continue on your current medications as directed. Please refer to the Current Medication list given to you today.  Labwork: BNP today     *We will only notify you of abnormal results, otherwise continue current treatment plan.  Testing/Procedures: None ordered  Follow-Up: Your physician wants you to follow-up in: 1 year with Dr. Elberta Fortis.  You will receive a reminder letter in the mail two months in advance. If you don't receive a letter, please call our office to schedule the follow-up appointment.   * If you need a refill on your cardiac medications before your next appointment, please call your pharmacy.   *Please note that any paperwork needing to be filled out by the provider will need to be addressed at the front desk prior to seeing the provider. Please note that any FMLA, disability or other documents regarding health condition is subject to a $25.00 charge that must be received prior to completion of paperwork in the form of a money order or check.  Thank you for choosing [facility]!!   Dory Horn, RN [phone]

## 2017-11-03 LAB — PRO B NATRIURETIC PEPTIDE: NT-PRO BNP: 26 pg/mL (ref 0–86)

## 2017-11-05 ENCOUNTER — Other Ambulatory Visit: Payer: Self-pay | Admitting: Cardiology

## 2017-11-10 ENCOUNTER — Other Ambulatory Visit: Payer: Self-pay | Admitting: Cardiology

## 2017-11-10 MED ORDER — FLECAINIDE ACETATE 150 MG PO TABS: 150.0000 mg | ORAL_TABLET | Freq: Two times a day (BID) | ORAL | 1 refills | Status: DC

## 2017-11-10 NOTE — Telephone Encounter (Signed)
Pt's medication was sent to pt's pharmacy as requested. Confirmation received.  °

## 2017-11-10 NOTE — Telephone Encounter (Signed)
New message     *STAT* If patient is at the pharmacy, call can be transferred to refill team.   1. Which medications need to be refilled? (please list name of each medication and dose if known) flecainide (TAMBOCOR) 150 MG tablet  2. Which pharmacy/location (including street and city if local pharmacy) is medication to be sent to? [id number] - [place], [place] - [phone]  3. Do they need a 30 day or 90 day supply? 30

## 2017-11-16 ENCOUNTER — Telehealth: Payer: Self-pay | Admitting: Cardiology

## 2017-11-16 NOTE — Telephone Encounter (Signed)
New Message:      Pt c/o Shortness Of Breath: STAT if SOB developed within the last 24 hours or pt is noticeably SOB on the phone  1. Are you currently SOB (can you hear that pt is SOB on the phone)? Pt's wife  2. How long have you been experiencing SOB? Last couple of days  3. Are you SOB when sitting or when up moving around? Both  4. Are you currently experiencing any other symptoms? Pt's O2 levels are low

## 2017-11-16 NOTE — Telephone Encounter (Signed)
Wife calls in reporting patient not feeling well all night.  Patient has been experiencing CP, slightly elevated HRs (110s), and O2 sats low-mid 90s.  Advised to go to ED for evaluation of chest pain. Wife verbalized understanding and agreeable to plan.

## 2017-11-19 ENCOUNTER — Other Ambulatory Visit: Payer: Self-pay

## 2017-11-19 ENCOUNTER — Emergency Department (HOSPITAL_BASED_OUTPATIENT_CLINIC_OR_DEPARTMENT_OTHER)
Admission: EM | Admit: 2017-11-19 | Discharge: 2017-11-19 | Disposition: A | Discharge status: Home / Self Care | Payer: BLUE CROSS/BLUE SHIELD | Attending: Emergency Medicine | Admitting: Emergency Medicine

## 2017-11-19 ENCOUNTER — Encounter (HOSPITAL_BASED_OUTPATIENT_CLINIC_OR_DEPARTMENT_OTHER): Payer: Self-pay | Admitting: Emergency Medicine

## 2017-11-19 ENCOUNTER — Emergency Department (HOSPITAL_BASED_OUTPATIENT_CLINIC_OR_DEPARTMENT_OTHER): Payer: BLUE CROSS/BLUE SHIELD

## 2017-11-19 DIAGNOSIS — I10 Essential (primary) hypertension: Secondary | ICD-10-CM | POA: Diagnosis not present

## 2017-11-19 DIAGNOSIS — R600 Localized edema: Secondary | ICD-10-CM | POA: Diagnosis not present

## 2017-11-19 DIAGNOSIS — F17210 Nicotine dependence, cigarettes, uncomplicated: Secondary | ICD-10-CM | POA: Diagnosis not present

## 2017-11-19 DIAGNOSIS — Z79899 Other long term (current) drug therapy: Secondary | ICD-10-CM | POA: Insufficient documentation

## 2017-11-19 HISTORY — DX: Essential (primary) hypertension: I10

## 2017-11-19 LAB — CBC WITH DIFFERENTIAL/PLATELET
BASOS ABS: 0 10*3/uL (ref 0.0–0.1)
BASOS PCT: 0 %
EOS ABS: 0.1 10*3/uL (ref 0.0–0.7)
EOS PCT: 2 %
HCT: 37.6 % — ABNORMAL LOW (ref 39.0–52.0)
Hemoglobin: 13.1 g/dL (ref 13.0–17.0)
Lymphocytes Relative: 29 %
Lymphs Abs: 2.1 10*3/uL (ref 0.7–4.0)
MCH: 33.1 pg (ref 26.0–34.0)
MCHC: 34.8 g/dL (ref 30.0–36.0)
MCV: 94.9 fL (ref 78.0–100.0)
MONO ABS: 0.7 10*3/uL (ref 0.1–1.0)
Monocytes Relative: 10 %
Neutro Abs: 4.2 10*3/uL (ref 1.7–7.7)
Neutrophils Relative %: 59 %
PLATELETS: 226 10*3/uL (ref 150–400)
RBC: 3.96 MIL/uL — ABNORMAL LOW (ref 4.22–5.81)
RDW: 12.6 % (ref 11.5–15.5)
WBC: 7.2 10*3/uL (ref 4.0–10.5)

## 2017-11-19 LAB — BRAIN NATRIURETIC PEPTIDE: B NATRIURETIC PEPTIDE 5: 6.6 pg/mL (ref 0.0–100.0)

## 2017-11-19 LAB — TROPONIN I

## 2017-11-19 LAB — BASIC METABOLIC PANEL
Anion gap: 10 (ref 5–15)
BUN: 15 mg/dL (ref 6–20)
CALCIUM: 8.7 mg/dL — AB (ref 8.9–10.3)
CO2: 27 mmol/L (ref 22–32)
CREATININE: 1.04 mg/dL (ref 0.61–1.24)
Chloride: 101 mmol/L (ref 101–111)
GFR calc Af Amer: >60 mL/min (ref >60)
GFR calc non Af Amer: >60 mL/min (ref >60)
GLUCOSE: 186 mg/dL — AB (ref 65–99)
Potassium: 3.7 mmol/L (ref 3.5–5.1)
Sodium: 138 mmol/L (ref 135–145)

## 2017-11-19 LAB — D-DIMER, QUANTITATIVE: D-Dimer, Quant: <0.27 ug/mL-FEU (ref 0.00–0.50)

## 2017-11-19 MED ORDER — HYDROCHLOROTHIAZIDE 25 MG PO TABS: 25.0000 mg | ORAL_TABLET | Freq: Every day | ORAL | 0 refills | Status: DC

## 2017-11-19 NOTE — ED Provider Notes (Signed)
[facility] EMERGENCY DEPARTMENT Provider Note   CSN: [id number] Arrival date & time: 11/19/17  1320     History   Chief Complaint Chief Complaint  Patient presents with  . Leg Swelling    HPI Terrance Hodges is a 42 y.o. male.  HPI   Terrance Hodges is a 42yo male with a history of a-fib, hypertension, hyperlipidemia, tobacco use who presents to the emergency department from his PCP office for evaluation of intermittent wheezing, shortness of breath, chest pain and bilateral leg swelling.  Patient reports that over the past week he noticed swelling progressively moving from his ankles to his knees bilaterally.  Denies any associated pain.  States he also has had intermittent wheezing, usually right before he is about to fall asleep.  Denies history of asthma or COPD.  States that he has also felt intermittently short of breath, usually with exertion.  States that his wife has a pulse ox monitor and he has been fluctuating between 87% and 93% on room air.  He also reports 2 episodes of chest pain lasting less than 30 minutes which occurred earlier this week, last episode was 4 days ago.  He reports pain felt like chest tightness and was substernal and nonradiating.  He felt somewhat short of breath at the time, but denies nausea/vomiting or diaphoresis.  He denies history of exertional chest pain.  Denies history of DVT/PE, active cancer, recent surgery or prolonged immobilization, calf pain.  He denies fevers, chills, congestion, sore throat, cough, abdominal pain, nausea/vomiting, numbness, weakness.  Reports that he has not had chest pain for 4 days now.  At his PCP office his O2 sat dropped to 88% and they sent him here for potential CHF exacerbation.  Past Medical History:  Diagnosis Date  . A-fib (HCC)   . History of hematuria   . Hypertension   . Prostatitis   . Reflux   . SVT [facility]supraventricular tachycardia) [facility])     Patient Active Problem List   Diagnosis Date Noted  .  SVT (supraventricular tachycardia) (HCC) 04/09/2016    Past Surgical History:  Procedure Laterality Date  . ADENOIDECTOMY    . KNEE SURGERY    . TONSILLECTOMY    . URETER SURGERY    . WISDOM TOOTH EXTRACTION          Home Medications    Prior to Admission medications   Medication Sig Start Date End Date Taking? Authorizing Provider  carvedilol (COREG) 6.25 MG tablet TAKE 1 TABLET BY MOUTH 2 TIMES A DAY 11/10/17   Camnitz, Will Daphine Deutscher, MD  dexlansoprazole (DEXILANT) 60 MG capsule Take 60 mg by mouth daily. 05/12/17   [provider]  diltiazem (CARDIZEM CD) 360 MG 24 hr capsule TAKE 1 CAPSULE BY MOUTH DAILY. *MUST MAKE APPT FOR MORE REFILLS 11/06/17   Camnitz, Andree Coss, MD  flecainide (TAMBOCOR) 150 MG tablet Take 1 tablet (150 mg total) by mouth 2 (two) times daily. 11/10/17   Camnitz, Andree Coss, MD  omega-3 acid ethyl esters (LOVAZA) 1 g capsule Take 2 capsules by mouth 2 (two) times daily. 09/12/17   [provider]  oxyCODONE-acetaminophen (PERCOCET) 5-325 MG tablet Take 1 tablet by mouth every 6 (six) hours as needed for severe pain. 07/04/16   Elson Areas, PA-C    Family History Family History  Problem Relation Age of Onset  . Hypertension Mother   . Heart attack Father     Social History Social History   Tobacco  Use  . Smoking status: Current Every Day Smoker    Packs/day: 0.50    Years: 15.00    Pack years: 7.50    Types: Cigarettes  . Smokeless tobacco: Never Used  Substance Use Topics  . Alcohol use: No  . Drug use: No     Allergies   Bactrim [sulfamethoxazole-trimethoprim]; Clindamycin/lincomycin; Metoprolol; Penicillins; Pineapple; Toradol [ketorolac tromethamine]; Tramadol; and Vicodin [hydrocodone-acetaminophen]   Review of Systems Review of Systems  Constitutional: Negative for chills, diaphoresis and fever.  HENT: Negative for congestion and sore throat.   Eyes: Negative for visual disturbance.  Respiratory: Positive for  shortness of breath (intermittent) and wheezing (intermittent). Negative for cough.   Cardiovascular: Positive for leg swelling (bilateral). Negative for chest pain (last had episode of chest pain four days ago which lasted <30sec).  Gastrointestinal: Negative for abdominal pain, nausea and vomiting.  Genitourinary: Negative for difficulty urinating and frequency.  Musculoskeletal: Negative for back pain and gait problem.  Skin: Negative for color change, rash and wound.  Neurological: Negative for weakness, numbness and headaches.  Psychiatric/Behavioral: Negative for agitation.     Physical Exam Updated Vital Signs BP 127/79 (BP Location: Left Arm)   Pulse 74   Temp 98.2 F (36.8 C) (Oral)   Resp 15   Ht 5\' 10"  (1.778 m)   Wt 99.8 kg (220 lb)   SpO2 93%   BMI 31.57 kg/m   Physical Exam  Constitutional: He is oriented to person, place, and time. He appears well-developed and well-nourished. No distress.  Sitting at bedside in no apparent distress, nontoxic-appearing.  HENT:  Head: Normocephalic and atraumatic.  Mouth/Throat: Oropharynx is clear and moist. No oropharyngeal exudate.  Eyes: Pupils are equal, round, and reactive to light. Conjunctivae are normal. Right eye exhibits no discharge. Left eye exhibits no discharge.  Neck: Normal range of motion. Neck supple. No JVD present. No tracheal deviation present.  Cardiovascular: Normal rate, regular rhythm and intact distal pulses.  No murmur heard. Pulmonary/Chest: Effort normal and breath sounds normal. No stridor. No respiratory distress. He has no wheezes. He has no rales.  Abdominal: Soft. There is no tenderness.  Musculoskeletal:  1+ pitting edema to mid shin bilaterally.  No erythema, warmth or induration.  Bilateral calves nontender to palpation.  DP pulses 2+ bilaterally.  Left ankle with tenderness to palpation over lateral malleolus with mild overlying swelling. Full ankle ROM. No erythema, ecchymosis, or deformity  appreciated. No break in skin. No pain to fifth metatarsal area or navicular region. Achilles intact. Good pedal pulse and cap refill of toes. Sensation grossly intact.   Neurological: He is alert and oriented to person, place, and time. Coordination normal.  Skin: Skin is warm and dry. He is not diaphoretic.  Psychiatric: He has a normal mood and affect. His behavior is normal.  Nursing note and vitals reviewed.    ED Treatments / Results  Labs (all labs ordered are listed, but only abnormal results are displayed) Labs Reviewed  BASIC METABOLIC PANEL - Abnormal; Notable for the following components:      Result Value   Glucose, Bld 186 (*)    Calcium 8.7 (*)    All other components within normal limits  CBC WITH DIFFERENTIAL/PLATELET - Abnormal; Notable for the following components:   RBC 3.96 (*)    HCT 37.6 (*)    All other components within normal limits  TROPONIN I  BRAIN NATRIURETIC PEPTIDE  D-DIMER, QUANTITATIVE (NOT AT [facility])    EKG None  Radiology Dg Chest 2 View  Result Date: 11/19/2017 CLINICAL DATA:  42 y/o M; hypoxia and leg swelling. Shortness of breath. EXAM: CHEST - 2 VIEW COMPARISON:  04/09/2016 chest radiograph. FINDINGS: Stable heart size and mediastinal contours are within normal limits. Both lungs are clear. The visualized skeletal structures are unremarkable. IMPRESSION: No acute pulmonary process identified. Electronically Signed   By: Mitzi Hansen M.D.   On: 11/19/2017 13:59    Procedures Procedures (including critical care time)  Medications Ordered in ED Medications - No data to display   Initial Impression / Assessment and Plan / ED Course  I have reviewed the triage vital signs and the nursing notes.  Pertinent labs & imaging results that were available during my care of the patient were reviewed by me and considered in my medical decision making (see chart for details).     Presents for evaluation of intermittent shortness of  breath, bilateral leg swelling and 2 episodes of chest pain which occurred earlier this week.  His pulse ox was reported to be 88% at his PCP office earlier today.  On exam he is afebrile and vital signs stable. On exam he has mild 1+ pitting edema to the midshin bilaterally, denies overlying pain.  Lungs clear to auscultation.    Patient denies chest pain for >4d, troponin negative and EKG nonischemic therefore doubt ACS. BNP WNL and CXR without pulmonary edema or cardiomegaly, no concern for CHF. Ddimer negative, doubt PE. Kidney function normal. CBC and BMP unremarkable.  Patient was on continuous monitor while in the ED and his O2 sat never dropped below 93%. Plan to discharge home with HCTZ daily for the next week to aid in bilateral leg swelling and have counseled patient on use of compression stockings. His blood sugar was elevated today (bg 186), have counseled him to follow up with PCP for further monitoring. Discussed this patient with Dr. Fayrene Fearing who agrees with plan and discharge home.   Patient also requesting a left ankle brace, reporting that he twisted the ankle about 4 days ago.  I offered x-ray, and he declines as he does not think that it is broken.  On exam left foot is neurovascularly intact.  No erythema, warmth or signs of infection.  Patient placed in ankle brace for comfort.  Have counseled him on RICE protocol and NSAID use.  I told him that he can come back at any time for x-ray.  Final Clinical Impressions(s) / ED Diagnoses   Final diagnoses:  Bilateral lower extremity edema    ED Discharge Orders        Ordered    hydrochlorothiazide (HYDRODIURIL) 25 MG tablet  Daily     11/19/17 1550       Lawrence Marseilles 11/20/17 0047    Rolland Porter, MD 12/01/17 [phone]

## 2017-11-19 NOTE — Discharge Instructions (Signed)
Your blood work, EKG and chest xray were reassuring.   Your blood sugar was elevated (bg 186), please have this rechecked by your regular doctor.   Take the HCTZ daily as needed for leg swelling.  You can also wear compression stockings.  Please elevate the legs at home when you can.  Return to the ER if you have any new or concerning symptoms like new trouble breathing or chest pain.

## 2017-11-19 NOTE — ED Triage Notes (Signed)
Pt went to UC today for bilat leg swelling and sent here due to O2 sats of 88%. Denies SOB, cough.

## 2017-12-30 ENCOUNTER — Other Ambulatory Visit: Payer: Self-pay | Admitting: Cardiology

## 2018-02-08 ENCOUNTER — Other Ambulatory Visit: Payer: Self-pay | Admitting: Cardiology

## 2018-02-27 ENCOUNTER — Institutional Professional Consult (permissible substitution): Payer: BLUE CROSS/BLUE SHIELD | Admitting: Emergency Medicine

## 2018-03-08 ENCOUNTER — Telehealth: Payer: Self-pay | Admitting: *Deleted

## 2018-03-08 NOTE — Telephone Encounter (Signed)
Wife calling for her husband, who is also a patient of Dr. Elberta Fortis. She mentions that his cholesterol is not good and continues to remain elevated. They would like to know if Dr. Elberta Fortis has recommendation b/c they feel he needs more that what his PCP is recommending. Advised to have his lab work dropped off/faxed to our office for Dr. Elberta Fortis to review.   Will send our fax number via MyChart per request. I will copy this into husbands chart being this call is related to his healthcare.

## 2018-03-09 ENCOUNTER — Encounter: Payer: Self-pay | Admitting: Emergency Medicine

## 2018-03-09 ENCOUNTER — Ambulatory Visit (INDEPENDENT_AMBULATORY_CARE_PROVIDER_SITE_OTHER): Payer: BLUE CROSS/BLUE SHIELD | Admitting: Emergency Medicine

## 2018-03-09 VITALS — BP 142/90 | HR 87 | Wt 226.0 lb

## 2018-03-09 DIAGNOSIS — R061 Stridor: Secondary | ICD-10-CM | POA: Insufficient documentation

## 2018-03-09 DIAGNOSIS — R400 Somnolence: Secondary | ICD-10-CM

## 2018-03-09 DIAGNOSIS — R0902 Hypoxemia: Secondary | ICD-10-CM

## 2018-03-09 DIAGNOSIS — R609 Edema, unspecified: Secondary | ICD-10-CM

## 2018-03-09 NOTE — Assessment & Plan Note (Signed)
Based on their description it sounds like he is having noise more consistent with stridor than wheezing.  Interestingly it can happen when he is falling asleep, suspect related to some upper airway collapse and possibly also sleep disordered breathing.  Its minimally bothersome with exertion.  I think he needs a sleep study to assess for obstructive sleep apnea based on his sleepiness here today and the noise in his upper airway.  He also needs spirometry so that we can assess for a possible fixed upper airway obstruction, also lower airways disease like occult asthma.  If he continues to have stridor or if there is evidence for fixed obstruction on his testing then bronchoscopy would be warranted to visualize his upper and lower airways.  We will arrange for a sleep study We will perform full pulmonary function testing  We may decide to perform an echocardiogram in the future - we can discuss with Dr Barrington Ellison going forward.  Walking oximetry today on room air.  Follow with Dr. Delton Coombes after your sleep study with pulmonary function testing on the same day

## 2018-03-09 NOTE — Patient Instructions (Addendum)
We will arrange for a sleep study We will perform full pulmonary function testing  We may decide to perform an echocardiogram in the future - we can discuss with Dr Barrington Ellison going forward.  Walking oximetry today on room air.  Follow with Dr. Delton Coombes after your sleep study with pulmonary function testing on the same day

## 2018-03-09 NOTE — Progress Notes (Signed)
Subjective:    Patient ID: Terrance Hodges, male    DOB: 30-Dec-1975, 42 y.o.   MRN: [id number]  HPI 42 year old man with a history of tobacco use (25 pack years), atrial fibrillation, hypertension, GERD with Barrett's esophagus, chronic headache and back pain that is been managed with opioids.  He is referred today for evaluation of dyspnea, wheezing and hypoxemia.   He has had dyspnea and has heard some wheeze for the last year, since he had a bronchitis, was treated with steroids and abx. He also started breo at that time. He occasionally can have wheeze with exertion, but seems to be most notable when he is falling asleep. He notes poor sleep initiation, cannot stay asleep. Takes naps occasionally during the day. No real cough. He can exercise without dyspnea, but has trouble with stairs  Spirometry is available for review from 04/17/2017.  This was a poor study with an expiratory time of only 2 seconds.  Unable to fully interpret but consistent with restriction.   CXR 11/19/17 -- normal CXR  Review of Systems  Constitutional: Negative for fever and unexpected weight change.  HENT: Negative for congestion, dental problem, ear pain, nosebleeds, postnasal drip, rhinorrhea, sinus pressure, sneezing, sore throat and trouble swallowing.   Eyes: Negative for redness and itching.  Respiratory: Positive for shortness of breath and wheezing. Negative for cough and chest tightness.   Cardiovascular: Negative for palpitations and leg swelling.  Gastrointestinal: Negative for nausea and vomiting.  Genitourinary: Negative for dysuria.  Musculoskeletal: Negative for joint swelling.  Skin: Negative for rash.  Neurological: Negative for headaches.  Hematological: Does not bruise/bleed easily.  Psychiatric/Behavioral: Negative for dysphoric mood. The patient is not nervous/anxious.    Past Medical History:  Diagnosis Date  . A-fib (HCC)   . History of hematuria   . Hypertension   . Prostatitis   .  Reflux   . SVT (supraventricular tachycardia) (HCC)      Family History  Problem Relation Age of Onset  . Hypertension Mother   . Heart attack Father      Social History   Socioeconomic History  . Marital status: Married    Spouse name: Not on file  . Number of children: Not on file  . Years of education: Not on file  . Highest education level: Not on file  Occupational History  . Not on file  Social Needs  . Financial resource strain: Not on file  . Food insecurity:    Worry: Not on file    Inability: Not on file  . Transportation needs:    Medical: Not on file    Non-medical: Not on file  Tobacco Use  . Smoking status: Current Every Day Smoker    Packs/day: 0.50    Years: 15.00    Pack years: 7.50    Types: Cigarettes  . Smokeless tobacco: Never Used  Substance and Sexual Activity  . Alcohol use: No  . Drug use: No  . Sexual activity: Not on file  Lifestyle  . Physical activity:    Days per week: Not on file    Minutes per session: Not on file  . Stress: Not on file  Relationships  . Social connections:    Talks on phone: Not on file    Gets together: Not on file    Attends religious service: Not on file    Active member of club or organization: Not on file    Attends meetings of clubs or organizations:  Not on file    Relationship status: Not on file  . Intimate partner violence:    Fear of current or ex partner: Not on file    Emotionally abused: Not on file    Physically abused: Not on file    Forced sexual activity: Not on file  Other Topics Concern  . Not on file  Social History Narrative  . Not on file     Allergies  Allergen Reactions  . Bactrim [Sulfamethoxazole-Trimethoprim]   . Clindamycin/Lincomycin Hives  . Metoprolol   . Penicillins   . Pineapple   . Toradol [Ketorolac Tromethamine] Hives  . Tramadol   . Vicodin [Hydrocodone-Acetaminophen] Hives     Outpatient Medications Prior to Visit  Medication Sig Dispense Refill  .  carvedilol (COREG) 6.25 MG tablet TAKE 1 TABLET BY MOUTH TWICE A DAY 180 tablet 2  . dexlansoprazole (DEXILANT) 60 MG capsule Take 60 mg by mouth daily.    [name] diltiazem (CARDIZEM CD) 360 MG 24 hr capsule TAKE 1 CAPSULE BY MOUTH DAILY. *MUST MAKE APPT FOR MORE REFILLS 90 capsule 3  . fenofibrate micronized (LOFIBRA) 134 MG capsule Take 134 mg by mouth daily before breakfast.    . flecainide (TAMBOCOR) 150 MG tablet Take 1 tablet (150 mg total) by mouth 2 (two) times daily. 180 tablet 1  . oxyCODONE-acetaminophen (PERCOCET) 5-325 MG tablet Take 1 tablet by mouth every 6 (six) hours as needed for severe pain. 16 tablet 0  . tamsulosin (FLOMAX) 0.4 MG CAPS capsule Take 0.4 mg by mouth daily.    [name] diltiazem (TIAZAC) 360 MG 24 hr capsule TAKE 1 CAPSULE BY MOUTH EVERY DAY 90 capsule 2  . hydrochlorothiazide (HYDRODIURIL) 25 MG tablet Take 1 tablet (25 mg total) by mouth daily. Take daily as needed for leg swelling 14 tablet 0  . omega-3 acid ethyl esters (LOVAZA) 1 g capsule Take 2 capsules by mouth 2 (two) times daily.  3   No facility-administered medications prior to visit.         Objective:   Physical Exam Vitals:   03/09/18 1519  BP: (!) 142/90  Pulse: 87  SpO2: 94%  Weight: 226 lb (102.5 kg)   Gen: Pleasant, a bit slow to respond, sleepy, in no distress,  Blunted affect  ENT: No lesions,  mouth clear,  oropharynx clear, no postnasal drip  Neck: No JVD, no stridor when awake, but I heard some slight stridor when he was about to dose   Lungs: No use of accessory muscles, no wheeze or crackles.   Cardiovascular: RRR, heart sounds normal, no murmur or gallops, no peripheral edem  Musculoskeletal: No deformities, no cyanosis or clubbing  Neuro: alert, appears to be oriented, but a bit slow. His wife answers most questions for him.   Skin: Warm, no lesions or rash      Assessment & Plan:  Stridor Based on their description it sounds like he is having noise more consistent with  stridor than wheezing.  Interestingly it can happen when he is falling asleep, suspect related to some upper airway collapse and possibly also sleep disordered breathing.  Its minimally bothersome with exertion.  I think he needs a sleep study to assess for obstructive sleep apnea based on his sleepiness here today and the noise in his upper airway.  He also needs spirometry so that we can assess for a possible fixed upper airway obstruction, also lower airways disease like occult asthma.  If he continues to have stridor  or if there is evidence for fixed obstruction on his testing then bronchoscopy would be warranted to visualize his upper and lower airways.  We will arrange for a sleep study We will perform full pulmonary function testing  We may decide to perform an echocardiogram in the future - we can discuss with Dr Barrington Ellison going forward.  Walking oximetry today on room air.  Follow with Dr. Delton Coombes after your sleep study with pulmonary function testing on the same day  Hypoxemia The mentioned hypoxemia but I do not see where it has been documented.  Question whether respiratory suppression from narcotics may be a factor.  Question whether his episodes of daytime sleepiness and upper airway obstruction are contributing.  We will perform walking oximetry today to ensure that he does not desaturate with exertion.  The sleep study should help [place] as far as his nocturnal saturations  Edema He notes lower extremity edema, question whether this is related to pulmonary hypertension from what is discussed above.  He may need an echocardiogram to better investigate.  Levy Pupa, MD, PhD 03/09/2018, 3:51 PM [facility] [phone] or if no answer [phone]

## 2018-03-09 NOTE — Assessment & Plan Note (Signed)
The mentioned hypoxemia but I do not see where it has been documented.  Question whether respiratory suppression from narcotics may be a factor.  Question whether his episodes of daytime sleepiness and upper airway obstruction are contributing.  We will perform walking oximetry today to ensure that he does not desaturate with exertion.  The sleep study should help [place] as far as his nocturnal saturations

## 2018-03-09 NOTE — Assessment & Plan Note (Signed)
He notes lower extremity edema, question whether this is related to pulmonary hypertension from what is discussed above.  He may need an echocardiogram to better investigate.

## 2018-03-20 ENCOUNTER — Other Ambulatory Visit: Payer: Self-pay | Admitting: Emergency Medicine

## 2018-03-20 DIAGNOSIS — R400 Somnolence: Secondary | ICD-10-CM

## 2018-03-21 MED ORDER — ROSUVASTATIN CALCIUM 10 MG PO TABS: 10.0000 mg | ORAL_TABLET | Freq: Every day | ORAL | 6 refills | Status: DC

## 2018-03-21 NOTE — Telephone Encounter (Signed)
Spoke with pt's wife. Advised to have pt start Crestor 10 mg daily. She is currently in the hospital right now and will contact me sometime next week to schedule pt for follow up Lipid lab work. Rx sent to [facility] per request.

## 2018-03-21 NOTE — Addendum Note (Signed)
Addended by: Baird Lyons on: 03/21/2018 05:15 PM   Modules accepted: Orders

## 2018-04-01 IMAGING — DX DG LUMBAR SPINE COMPLETE 4+V
5 series · 5 of 5 positions shown · non-contrast
Comparison: CT abdomen and pelvis 11/19/2015.

CLINICAL DATA: Low back pain due to a slip and fall today. Initial
encounter.

EXAM:
LUMBAR SPINE - COMPLETE 4+ VIEW

[l-spine ap]
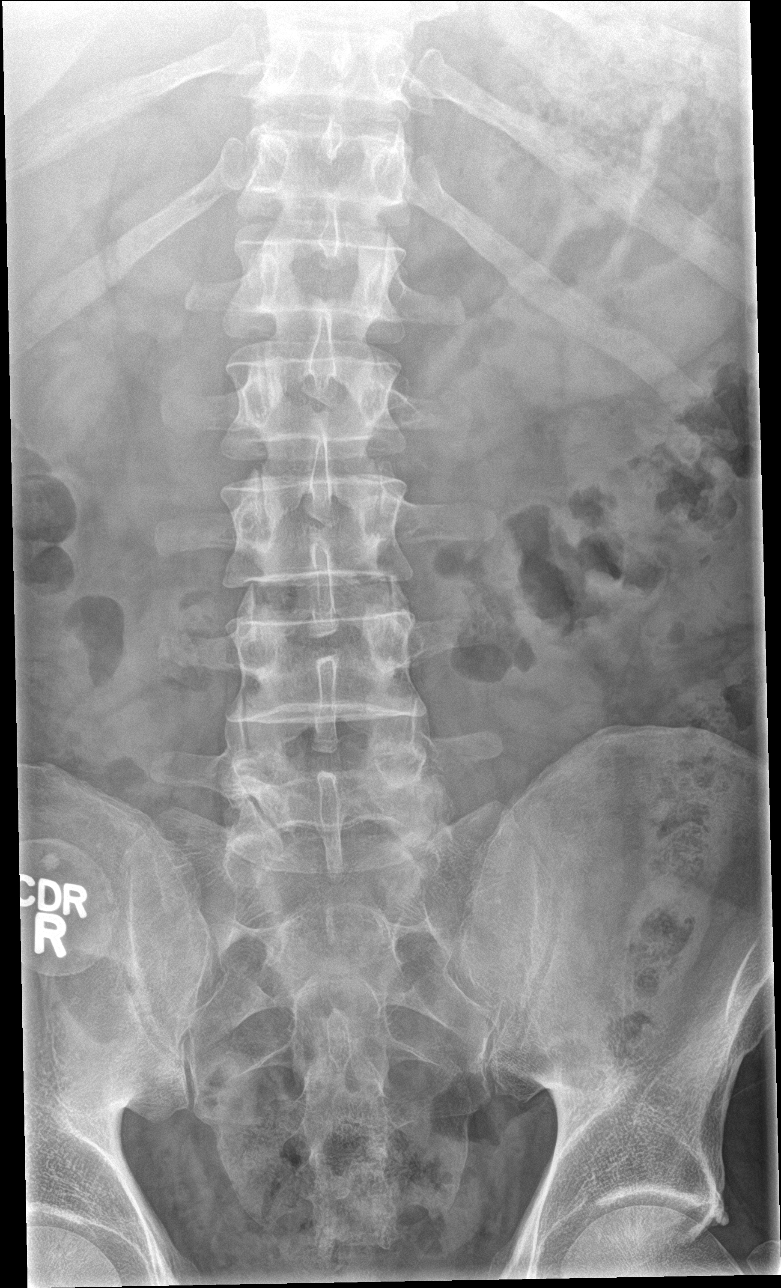

[l-spine obl (1 of 2)]
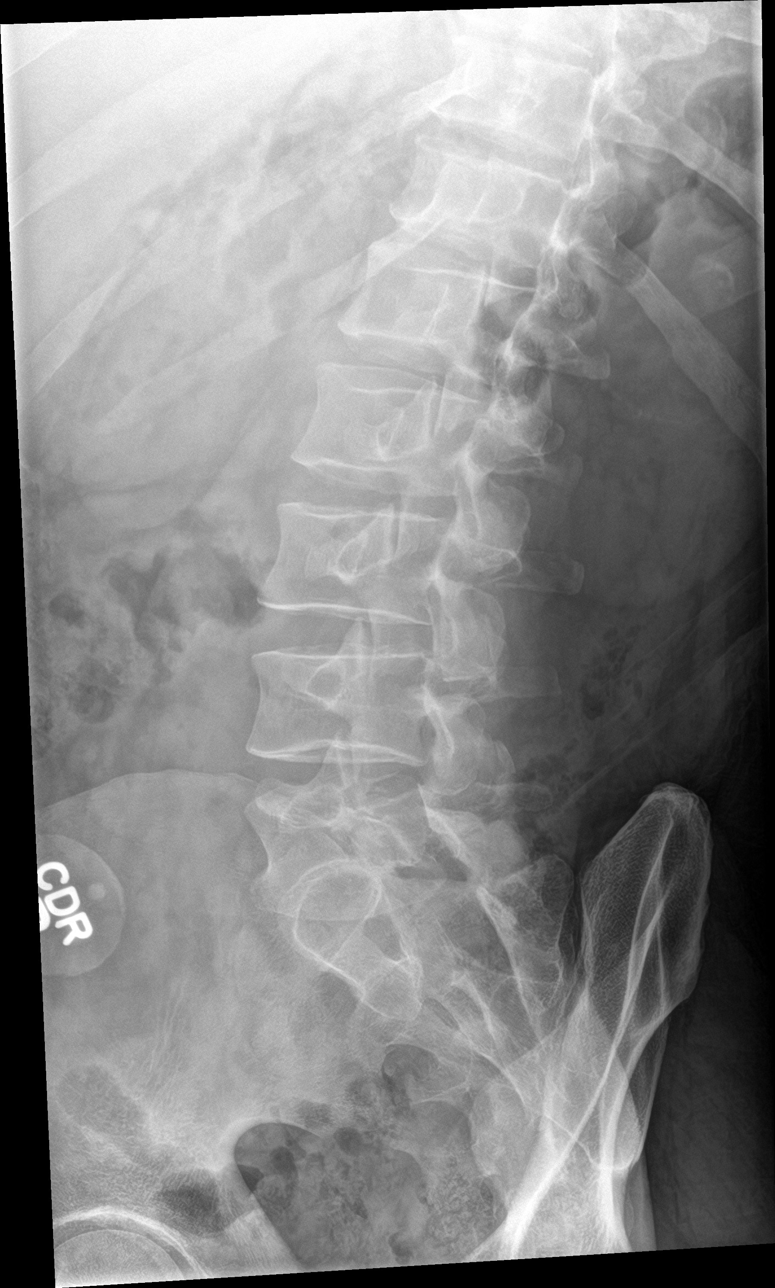

[l-spine obl (2 of 2)]
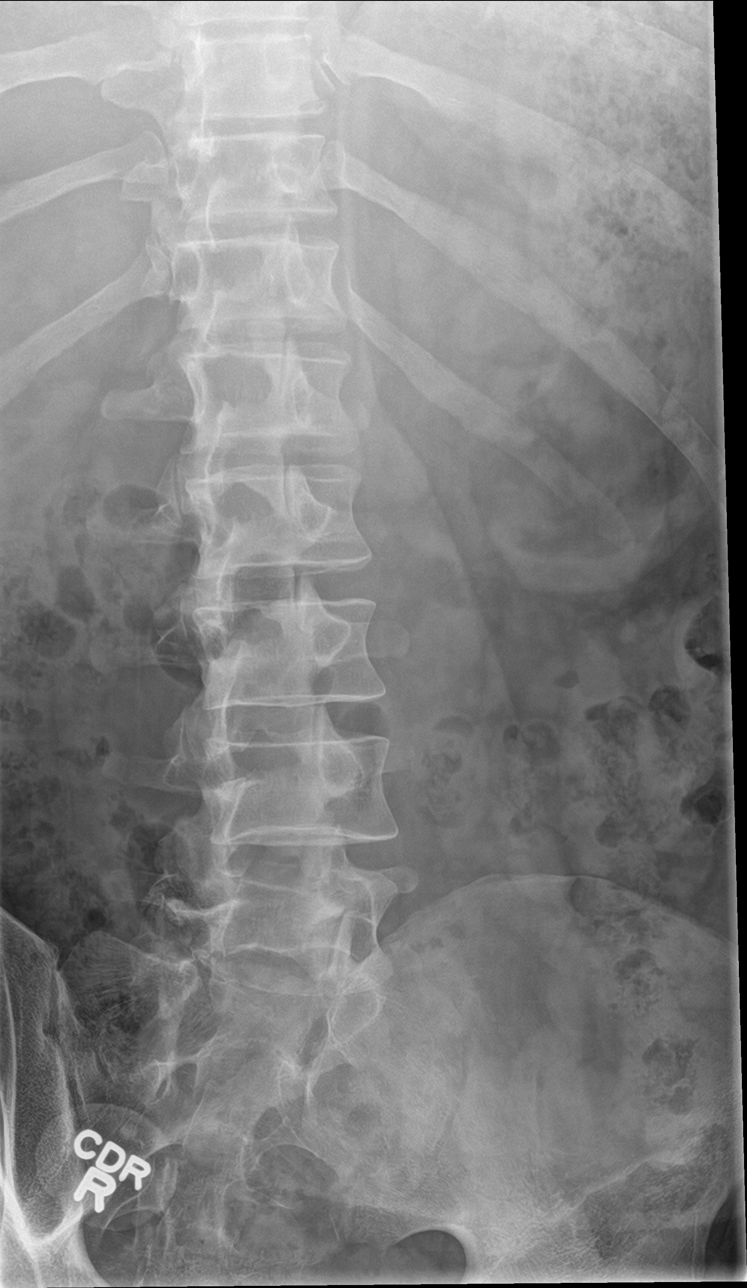

[l-spine lat]
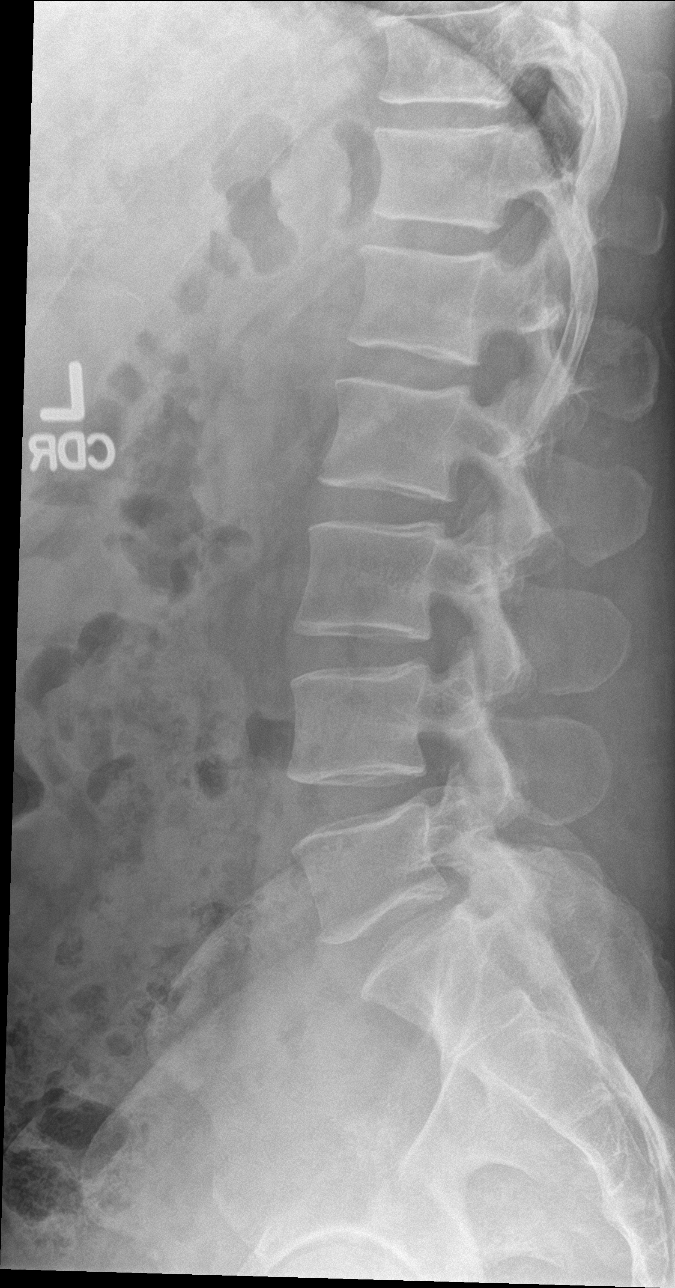

[l-spine spot]
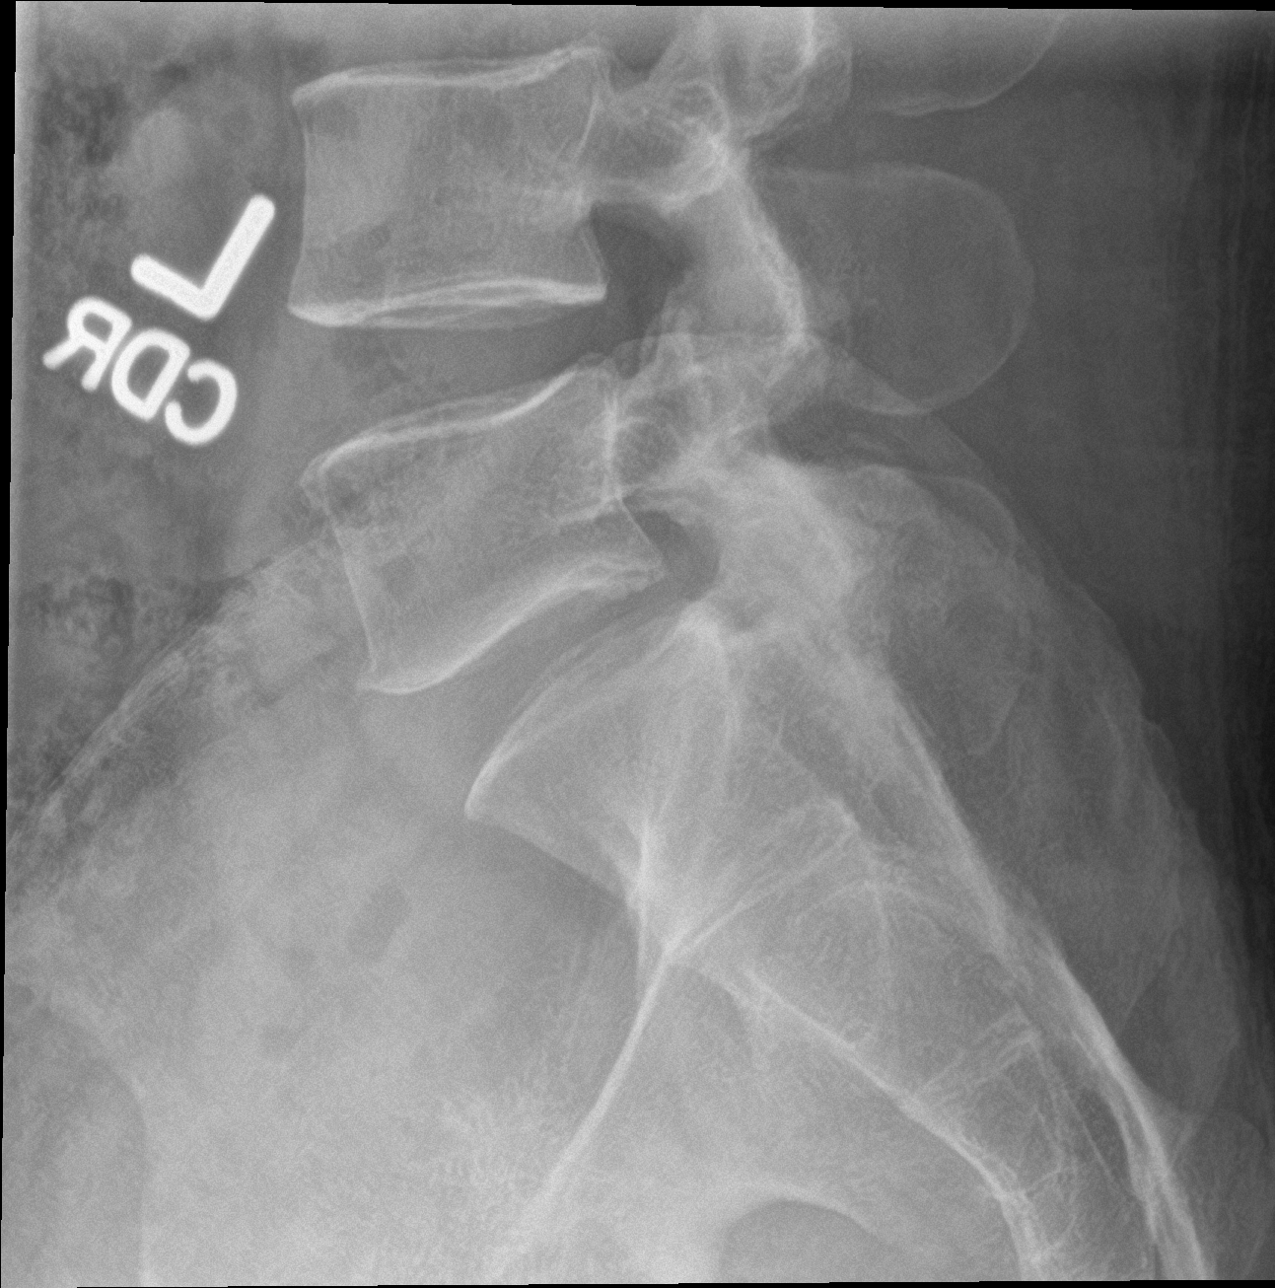

[5 of 5 positions shown; findings below may reference images not displayed]

FINDINGS: There is no evidence of lumbar spine fracture. Alignment is normal.
Intervertebral disc spaces are maintained. There is some facet
degenerative change at L5-S1.
IMPRESSION: No acute abnormality.  Facet degenerative disease L5-S1 noted.

## 2018-04-02 DIAGNOSIS — G4710 Hypersomnia, unspecified: Secondary | ICD-10-CM | POA: Diagnosis not present

## 2018-04-03 ENCOUNTER — Other Ambulatory Visit: Payer: Self-pay | Admitting: *Deleted

## 2018-04-03 DIAGNOSIS — G4710 Hypersomnia, unspecified: Secondary | ICD-10-CM | POA: Diagnosis not present

## 2018-04-03 DIAGNOSIS — R400 Somnolence: Secondary | ICD-10-CM

## 2018-04-04 ENCOUNTER — Telehealth: Payer: Self-pay | Admitting: Pulmonary Disease

## 2018-04-04 DIAGNOSIS — G4734 Idiopathic sleep related nonobstructive alveolar hypoventilation: Secondary | ICD-10-CM

## 2018-04-04 DIAGNOSIS — R0902 Hypoxemia: Secondary | ICD-10-CM

## 2018-04-04 NOTE — Telephone Encounter (Signed)
Dr. Wynona Neat has reviewed the home sleep test this test was negative for sleep apnea. But is did show moderately severe oxygen desaturation.   Dr . Wynona Neat recommendation are weight loss and also doing an over night oximetry test for nocturnal hypoxemia .  Avoid driving while sleepy and avoid taking drugs that caused sedative measures.    Patient is aware and order  Place nothig further needed.

## 2018-04-11 ENCOUNTER — Ambulatory Visit: Payer: BLUE CROSS/BLUE SHIELD | Admitting: Emergency Medicine

## 2018-04-15 ENCOUNTER — Encounter (HOSPITAL_BASED_OUTPATIENT_CLINIC_OR_DEPARTMENT_OTHER): Payer: BLUE CROSS/BLUE SHIELD

## 2018-04-27 ENCOUNTER — Telehealth: Payer: Self-pay | Admitting: Pulmonary Disease

## 2018-04-27 DIAGNOSIS — G4734 Idiopathic sleep related nonobstructive alveolar hypoventilation: Secondary | ICD-10-CM

## 2018-04-27 NOTE — Telephone Encounter (Signed)
Dr. Wynona Neatlalere advised that the ono show oxygen desaturation while sleeping he would like to start you on 1l of oxygen and repeat the ono on the 1l of o2.   Left massage on machine to call back.

## 2018-04-30 NOTE — Telephone Encounter (Signed)
Called and spoke with patients wife, she is aware and verbalized understanding. Orders have been placed. Nothing further needed.

## 2018-04-30 NOTE — Telephone Encounter (Signed)
Pt's wife [place] is calling back [phone]

## 2018-05-15 ENCOUNTER — Telehealth: Payer: Self-pay | Admitting: Emergency Medicine

## 2018-05-15 DIAGNOSIS — G4734 Idiopathic sleep related nonobstructive alveolar hypoventilation: Secondary | ICD-10-CM

## 2018-05-15 NOTE — Telephone Encounter (Signed)
[facility]   Will leave in box

## 2018-05-15 NOTE — Telephone Encounter (Signed)
Called and spoke with Terrance Hodges, the patient was not able to have the ONO done due to the order just being signed today and returned. The order was originally placed back in November.   RB please advise if you would like the patient to still have the test, if so a new order will need to be placed and signed.

## 2018-05-15 NOTE — Telephone Encounter (Signed)
Ok to order a repeat ONO to see if he still needs it.

## 2018-05-16 ENCOUNTER — Ambulatory Visit: Payer: BLUE CROSS/BLUE SHIELD | Admitting: Primary Care

## 2018-05-16 NOTE — Telephone Encounter (Signed)
Called pt's wife, there was no answer, LM to call back.

## 2018-05-16 NOTE — Telephone Encounter (Signed)
Pt's wife [place]  [phone]

## 2018-05-16 NOTE — Telephone Encounter (Signed)
Called and spoke with patients wife, she stated that the patient had the ONO done last week with [facility]. RB please advise

## 2018-05-16 NOTE — Telephone Encounter (Signed)
[place] returned call, CB is [phone].

## 2018-05-18 NOTE — Telephone Encounter (Signed)
Make sure [facility]  If there is documented desaturation then I will order 2 L/min while sleeping.

## 2018-05-18 NOTE — Telephone Encounter (Signed)
Called [place] with [facility]. Left message to return our phone call.

## 2018-05-21 NOTE — Telephone Encounter (Signed)
[facility] and the the patient was started on o2 from the and we would need to send the ono order to them. The patient will still have the ono done but [facility] advised [facility] did not need anything further from us at this time.

## 2018-05-21 NOTE — Addendum Note (Signed)
Addended by: Zane HeraldMOORE, Jordanny Waddington R on: 05/21/2018 12:26 PM   Modules accepted: Orders

## 2018-05-21 NOTE — Telephone Encounter (Signed)
[facility] needed a visit within 30 days of the order but he did not and now the patient will need another ono and and ov to be able to order o2.    Patient has ov with Buelah ManisBeth walsh on 05/28/18.[employer] states that the ov needs to go over need for o2 at night. I will resend in the order for ono on o2 1l. Patient is currently using 1l at night per Dr.Olalere. Note    Dr. Wynona Neatlalere advised that the ono show oxygen desaturation while sleeping he would like to start you on 1l of oxygen and repeat the ono on the 1l of o2.       This was advised on 04/27/18.  Sending to RB and US[place]beth Walsh as [phone]

## 2018-05-22 NOTE — Telephone Encounter (Signed)
Order was placed by Zane HeraldHeather Moore, LPN. Nothing further needed.

## 2018-05-22 NOTE — Telephone Encounter (Signed)
Please order the ONO on 1L/min O2 to establish that this dose is adequate

## 2018-05-28 ENCOUNTER — Ambulatory Visit: Payer: BLUE CROSS/BLUE SHIELD | Admitting: Primary Care

## 2018-05-28 ENCOUNTER — Telehealth: Payer: Self-pay | Admitting: Emergency Medicine

## 2018-05-28 NOTE — Telephone Encounter (Signed)
Left message informing patient his sleep study results will be available at his office visit today and if anything else was needed to call us back. Nothing further is needed at this time.

## 2018-06-05 NOTE — Telephone Encounter (Signed)
Pt's wife (ok to speak w/ wife per pt) returned my call from yesterday.  Informed her I was following up on pt starting Crestor and follow up blood work. She reports pt started Crestor, as instructed, in November.  She will have pt's PCP do follow up lab work in several months.  If he doesn't have done before his f/u w/ Camnitz closer to summer, then she understands we will draw then.

## 2018-06-26 ENCOUNTER — Telehealth: Payer: Self-pay | Admitting: Cardiology

## 2018-06-26 NOTE — Telephone Encounter (Signed)
Cholesterol is now 153 Triglycerides are now 169 HDL is now 34 LDL is now 86.  Will ask PCP to fax result over to our office. Will forward information to Dr. Elberta Fortis for his FYI. Aware that we will probably re-address repeat lab work when they see [place] in several months. Wife verbalized understanding and agreeable to plan.

## 2018-06-26 NOTE — Telephone Encounter (Signed)
° ° °  Patient's spouse calling to report cholesterol results

## 2018-06-27 NOTE — Telephone Encounter (Signed)
Dr. Flossie Buffy office faxing lab results today

## 2018-06-27 NOTE — Telephone Encounter (Signed)
Results received.  Given to medical records to scan into chart.

## 2018-07-23 ENCOUNTER — Other Ambulatory Visit: Payer: Self-pay

## 2018-07-23 ENCOUNTER — Encounter (HOSPITAL_BASED_OUTPATIENT_CLINIC_OR_DEPARTMENT_OTHER): Payer: Self-pay | Admitting: *Deleted

## 2018-07-23 ENCOUNTER — Emergency Department (HOSPITAL_BASED_OUTPATIENT_CLINIC_OR_DEPARTMENT_OTHER)
Admission: EM | Admit: 2018-07-23 | Discharge: 2018-07-23 | Disposition: A | Discharge status: Home / Self Care | Payer: BLUE CROSS/BLUE SHIELD | Attending: Emergency Medicine | Admitting: Emergency Medicine

## 2018-07-23 DIAGNOSIS — Z79899 Other long term (current) drug therapy: Secondary | ICD-10-CM | POA: Insufficient documentation

## 2018-07-23 DIAGNOSIS — Z7984 Long term (current) use of oral hypoglycemic drugs: Secondary | ICD-10-CM | POA: Diagnosis not present

## 2018-07-23 DIAGNOSIS — K0889 Other specified disorders of teeth and supporting structures: Secondary | ICD-10-CM | POA: Diagnosis not present

## 2018-07-23 DIAGNOSIS — I10 Essential (primary) hypertension: Secondary | ICD-10-CM | POA: Diagnosis not present

## 2018-07-23 DIAGNOSIS — E119 Type 2 diabetes mellitus without complications: Secondary | ICD-10-CM | POA: Diagnosis not present

## 2018-07-23 DIAGNOSIS — F17210 Nicotine dependence, cigarettes, uncomplicated: Secondary | ICD-10-CM | POA: Diagnosis not present

## 2018-07-23 HISTORY — DX: Type 2 diabetes mellitus without complications: E11.9

## 2018-07-23 NOTE — Discharge Instructions (Addendum)
Follow up with a dentist is very important for ongoing evaluation and management of recurrent dental pain. Return to emergency department for emergent changing or worsening symptoms.  Apply warm compresses to jaw throughout the day. Take antibiotic until completion.   Take your home dose of pain medication as directed, as needed for pain but do not drive or operate machinery with pain medication use.

## 2018-07-23 NOTE — ED Triage Notes (Signed)
Tooth broke off last Monday, pain started fours days ago but gets worst today.

## 2018-07-23 NOTE — ED Provider Notes (Signed)
[facility] EMERGENCY DEPARTMENT Provider Note   CSN: [id number] Arrival date & time: 07/23/18  7711     History   Chief Complaint Chief Complaint  Patient presents with  . Dental Pain    HPI Terrance Hodges is a 43 y.o. male A. fib, diabetes, hypertension, SVT presents to emergency department today with chief complaint of dental pain x4 days.  Patient reports 1 week ago he was eating something and felt his tooth break.  It has not been painful in the last 4 days.  He describes the pain as stabbing and constant.  The pain does not radiate.  He rates the pain 8 out of 10 severity.  Patient takes Percocet for chronic back pain.  He has been taking that as well as over-the-counter Orajel.  He states the medications relieve his pain temporarily.    Denies fever, shortness of breath, difficulty swallowing, voice changes, facial swelling.  Past Medical History:  Diagnosis Date  . A-fib (HCC)   . Diabetes mellitus without complication (HCC)   . History of hematuria   . Hypertension   . Prostatitis   . Reflux   . SVT (supraventricular tachycardia) [facility])     Patient Active Problem List   Diagnosis Date Noted  . Stridor 03/09/2018  . Hypoxemia 03/09/2018  . Edema 03/09/2018  . SVT (supraventricular tachycardia) (HCC) 04/09/2016    Past Surgical History:  Procedure Laterality Date  . ADENOIDECTOMY    . KNEE SURGERY    . TONSILLECTOMY    . URETER SURGERY    . WISDOM TOOTH EXTRACTION          Home Medications    Prior to Admission medications   Medication Sig Start Date End Date Taking? Authorizing Provider  carvedilol (COREG) 6.25 MG tablet TAKE 1 TABLET BY MOUTH TWICE A DAY 02/08/18  Yes Camnitz, Will Daphine Deutscher, MD  dexlansoprazole (DEXILANT) 60 MG capsule Take 60 mg by mouth daily. 05/12/17  Yes [provider]  diltiazem (CARDIZEM CD) 360 MG 24 hr capsule TAKE 1 CAPSULE BY MOUTH DAILY. *MUST MAKE APPT FOR MORE REFILLS 11/06/17  Yes Camnitz, Andree Coss, MD    fenofibrate micronized (LOFIBRA) 134 MG capsule Take 134 mg by mouth daily before breakfast.   Yes [provider]  flecainide (TAMBOCOR) 150 MG tablet Take 1 tablet (150 mg total) by mouth 2 (two) times daily. 11/10/17  Yes Camnitz, Will Daphine Deutscher, MD  metFORMIN (GLUCOPHAGE) 500 MG tablet Take by mouth 2 (two) times daily with a meal.   Yes [provider]  oxyCODONE-acetaminophen (PERCOCET) 5-325 MG tablet Take 1 tablet by mouth every 6 (six) hours as needed for severe pain. 07/04/16  Yes Cheron Schaumann K, PA-C  rosuvastatin (CRESTOR) 10 MG tablet Take 1 tablet (10 mg total) by mouth daily. 03/21/18 07/23/18 Yes Camnitz, Will Daphine Deutscher, MD  tamsulosin (FLOMAX) 0.4 MG CAPS capsule Take 0.4 mg by mouth daily.   Yes [provider]    Family History Family History  Problem Relation Age of Onset  . Hypertension Mother   . Heart attack Father     Social History Social History   Tobacco Use  . Smoking status: Current Every Day Smoker    Packs/day: 0.50    Years: 15.00    Pack years: 7.50    Types: Cigarettes  . Smokeless tobacco: Never Used  Substance Use Topics  . Alcohol use: No  . Drug use: No     Allergies   Bactrim [sulfamethoxazole-trimethoprim];  Clindamycin/lincomycin; Metoprolol; Penicillins; Pineapple; Toradol [ketorolac tromethamine]; Tramadol; and Vicodin [hydrocodone-acetaminophen]   Review of Systems Review of Systems  HENT: Positive for dental problem.   All other systems reviewed and are negative.    Physical Exam Updated Vital Signs BP 131/88 (BP Location: Right Arm)   Pulse 86   Temp 98.7 F (37.1 C) (Oral)   Resp 16   Ht 5\' 10"  (1.778 m)   Wt 97.5 kg   SpO2 93%   BMI 30.85 kg/m   Physical Exam Constitutional:      Appearance: Normal appearance. He is well-developed.  HENT:     Head: Normocephalic and atraumatic.     Mouth/Throat:     Comments: Pt has multiple caries and fractured teeth. Pain to left lateral incisor with  fracture that exposes pulp.No noted area of swelling or fluctuance. No trismus. Mouth opening to at least 3 finger widths. Handles oral secretions without difficulty. External exam shows no asymmetry of the jaw line or face, no signs of obvious swelling or infection. No swelling or tenderness to the submental or submandibular regions. No swelling or tenderness into the soft tissues of the neck.Full active range of motion of the jaw. Neck is supple with full active range of motion, no tenderness to palpation of the soft tissues.   Eyes:     General:        Right eye: No discharge.        Left eye: No discharge.     Conjunctiva/sclera: Conjunctivae normal.  Neck:     Musculoskeletal: Normal range of motion. No muscular tenderness.  Pulmonary:     Effort: Pulmonary effort is normal.  Skin:    General: Skin is warm and dry.  Neurological:     Mental Status: He is alert.  Psychiatric:        Behavior: Behavior normal.      ED Treatments / Results  Labs (all labs ordered are listed, but only abnormal results are displayed) Labs Reviewed - No data to display  EKG None  Radiology No results found.  Procedures Procedures (including critical care time)  Medications Ordered in ED Medications - No data to display   Initial Impression / Assessment and Plan / ED Course  I have reviewed the triage vital signs and the nursing notes.  Pertinent labs & imaging results that were available during my care of the patient were reviewed by me and considered in my medical decision making (see chart for details).    Dental pain associated with dental cary but no signs or symptoms of dental abscess with patient afebrile, non toxic appearing and swallowing secretions well. I gave patient referral to dentist and stressed the importance of dental follow up for ultimate management of dental pain.  Patient states he has a dentist appointment scheduled for 1 PM today.  I offered antibiotics, but  he declined because it is likely the dentist will pull this tooth and provide antibiotics.  Patient has taken his home dose of Percocet prior to arrival and declines ibuprofen. Patient voices understanding and is agreeable to plan. Discussed strict ED return precautions. Pt verbalized understanding of and is in agreement with this plan. Pt stable for discharge home at this time.    Final Clinical Impressions(s) / ED Diagnoses   Final diagnoses:  Pain, dental    ED Discharge Orders    None       Sherene Sires, PA-C 07/23/18 1050    Vanetta Mulders, MD 07/25/18  1307  

## 2018-08-06 ENCOUNTER — Encounter (HOSPITAL_BASED_OUTPATIENT_CLINIC_OR_DEPARTMENT_OTHER): Payer: Self-pay | Admitting: Emergency Medicine

## 2018-08-06 ENCOUNTER — Emergency Department (HOSPITAL_BASED_OUTPATIENT_CLINIC_OR_DEPARTMENT_OTHER): Payer: BLUE CROSS/BLUE SHIELD

## 2018-08-06 ENCOUNTER — Emergency Department (HOSPITAL_BASED_OUTPATIENT_CLINIC_OR_DEPARTMENT_OTHER)
Admission: EM | Admit: 2018-08-06 | Discharge: 2018-08-06 | Disposition: A | Discharge status: Home / Self Care | Payer: BLUE CROSS/BLUE SHIELD | Attending: Emergency Medicine | Admitting: Emergency Medicine

## 2018-08-06 ENCOUNTER — Other Ambulatory Visit: Payer: Self-pay

## 2018-08-06 DIAGNOSIS — L03221 Cellulitis of neck: Secondary | ICD-10-CM | POA: Insufficient documentation

## 2018-08-06 DIAGNOSIS — R221 Localized swelling, mass and lump, neck: Secondary | ICD-10-CM | POA: Diagnosis present

## 2018-08-06 DIAGNOSIS — Z79899 Other long term (current) drug therapy: Secondary | ICD-10-CM | POA: Insufficient documentation

## 2018-08-06 DIAGNOSIS — Z880 Allergy status to penicillin: Secondary | ICD-10-CM | POA: Insufficient documentation

## 2018-08-06 DIAGNOSIS — L0211 Cutaneous abscess of neck: Secondary | ICD-10-CM | POA: Diagnosis not present

## 2018-08-06 DIAGNOSIS — F17210 Nicotine dependence, cigarettes, uncomplicated: Secondary | ICD-10-CM | POA: Diagnosis not present

## 2018-08-06 DIAGNOSIS — I10 Essential (primary) hypertension: Secondary | ICD-10-CM | POA: Insufficient documentation

## 2018-08-06 DIAGNOSIS — Z7984 Long term (current) use of oral hypoglycemic drugs: Secondary | ICD-10-CM | POA: Insufficient documentation

## 2018-08-06 DIAGNOSIS — E119 Type 2 diabetes mellitus without complications: Secondary | ICD-10-CM | POA: Diagnosis not present

## 2018-08-06 LAB — BASIC METABOLIC PANEL
Anion gap: 8 (ref 5–15)
BUN: 12 mg/dL (ref 6–20)
CO2: 23 mmol/L (ref 22–32)
Calcium: 9.4 mg/dL (ref 8.9–10.3)
Chloride: 106 mmol/L (ref 98–111)
Creatinine, Ser: 0.95 mg/dL (ref 0.61–1.24)
GFR calc Af Amer: >60 mL/min (ref >60)
GFR calc non Af Amer: >60 mL/min (ref >60)
Glucose, Bld: 107 mg/dL — ABNORMAL HIGH (ref 70–99)
Potassium: 3.9 mmol/L (ref 3.5–5.1)
Sodium: 137 mmol/L (ref 135–145)

## 2018-08-06 LAB — CBC WITH DIFFERENTIAL/PLATELET
Abs Immature Granulocytes: 0.04 10*3/uL (ref 0.00–0.07)
Basophils Absolute: 0 10*3/uL (ref 0.0–0.1)
Basophils Relative: 0 %
Eosinophils Absolute: 0.1 10*3/uL (ref 0.0–0.5)
Eosinophils Relative: 1 %
HCT: 42.5 % (ref 39.0–52.0)
Hemoglobin: 14.6 g/dL (ref 13.0–17.0)
Immature Granulocytes: 1 %
Lymphocytes Relative: 26 %
Lymphs Abs: 2.1 10*3/uL (ref 0.7–4.0)
MCH: 32.3 pg (ref 26.0–34.0)
MCHC: 34.4 g/dL (ref 30.0–36.0)
MCV: 94 fL (ref 80.0–100.0)
Monocytes Absolute: 0.5 10*3/uL (ref 0.1–1.0)
Monocytes Relative: 6 %
Neutro Abs: 5.5 10*3/uL (ref 1.7–7.7)
Neutrophils Relative %: 66 %
Platelets: 324 10*3/uL (ref 150–400)
RBC: 4.52 MIL/uL (ref 4.22–5.81)
RDW: 12 % (ref 11.5–15.5)
WBC: 8.3 10*3/uL (ref 4.0–10.5)
nRBC: 0 % (ref 0.0–0.2)

## 2018-08-06 MED ORDER — DOXYCYCLINE HYCLATE 100 MG PO CAPS: 100.0000 mg | ORAL_CAPSULE | Freq: Two times a day (BID) | ORAL | 0 refills | Status: DC

## 2018-08-06 MED ORDER — OXYCODONE-ACETAMINOPHEN 5-325 MG PO TABS
2.0000 | ORAL_TABLET | Freq: Once | ORAL | Status: AC
  Administered 2018-08-06: 2 via ORAL
  Filled 2018-08-06: qty 2

## 2018-08-06 MED ORDER — ONDANSETRON HCL 4 MG/2ML IJ SOLN
4.0000 mg | Freq: Once | INTRAMUSCULAR | Status: AC
  Administered 2018-08-06: 4 mg via INTRAVENOUS
  Filled 2018-08-06: qty 2

## 2018-08-06 MED ORDER — IOHEXOL 300 MG/ML  SOLN
100.0000 mL | Freq: Once | INTRAMUSCULAR | Status: AC | PRN
  Administered 2018-08-06: 100 mL via INTRAVENOUS

## 2018-08-06 MED ORDER — MORPHINE SULFATE (PF) 4 MG/ML IV SOLN
4.0000 mg | Freq: Once | INTRAVENOUS | Status: AC
Start: 2018-08-06 — End: 2018-08-06
  Administered 2018-08-06: 4 mg via INTRAVENOUS
  Filled 2018-08-06: qty 1

## 2018-08-06 NOTE — Discharge Instructions (Addendum)
Take doxycycline twice daily until completed.  Take Percocet as prescribed by your doctor, as needed for pain.  Use warm compresses 3-4 times daily alternating 20 minutes on, 20 minutes off.  Please return here or see your doctor in 2 to 3 days for recheck of the area.  Please return sooner if you develop any increasing pain, redness, swelling, red streaking from the area, inability to move your neck, or any other concerning symptoms.

## 2018-08-06 NOTE — ED Triage Notes (Addendum)
Reports cyst to back of head x 2 weeks.  States this has gotten larger over the past 4-5 days. C/o headaches.  Ambulatory to triage, pushing spouse who is in wheelchair.  Patient in NAD.

## 2018-08-06 NOTE — ED Provider Notes (Signed)
[facility] EMERGENCY DEPARTMENT Provider Note   CSN: [id number] Arrival date & time: 08/06/18  1004    History   Chief Complaint Chief Complaint  Patient presents with  . Cyst    HPI Terrance Hodges is a 43 y.o. male with history of atrial fibrillation, diabetes, hypertension who presents with a 2-week history of swelling to the back of his neck and pain has been increased for the past 4 to 5 days.  He reports he cannot lay on it.  He hurts in that area when he moves his neck.  He denies any fevers.  He reports there is an abscess close by that has been drained in the past.  Patient denies any fevers.  He has not tried any medication or intervention at home for symptoms.  He is currently being treated for a fungal infection on his left earlobe with nystatin as well as clobetasol.  He is also tried gentamicin.  Dermatology and ENT have evaluated him for this.     HPI  Past Medical History:  Diagnosis Date  . A-fib (HCC)   . Diabetes mellitus without complication (HCC)   . History of hematuria   . Hypertension   . Prostatitis   . Reflux   . SVT (supraventricular tachycardia) [facility])     Patient Active Problem List   Diagnosis Date Noted  . Stridor 03/09/2018  . Hypoxemia 03/09/2018  . Edema 03/09/2018  . SVT (supraventricular tachycardia) (HCC) 04/09/2016    Past Surgical History:  Procedure Laterality Date  . ADENOIDECTOMY    . KNEE SURGERY    . TONSILLECTOMY    . URETER SURGERY    . WISDOM TOOTH EXTRACTION          Home Medications    Prior to Admission medications   Medication Sig Start Date End Date Taking? Authorizing Provider  carvedilol (COREG) 6.25 MG tablet TAKE 1 TABLET BY MOUTH TWICE A DAY 02/08/18   Camnitz, Will Daphine Deutscher, MD  dexlansoprazole (DEXILANT) 60 MG capsule Take 60 mg by mouth daily. 05/12/17   [provider]  diltiazem (CARDIZEM CD) 360 MG 24 hr capsule TAKE 1 CAPSULE BY MOUTH DAILY. *MUST MAKE APPT FOR MORE REFILLS 11/06/17    Camnitz, Andree Coss, MD  doxycycline (VIBRAMYCIN) 100 MG capsule Take 1 capsule (100 mg total) by mouth 2 (two) times daily. 08/06/18   Jazlen Ogarro, Waylan Boga, PA-C  fenofibrate micronized (LOFIBRA) 134 MG capsule Take 134 mg by mouth daily before breakfast.    [provider]  flecainide (TAMBOCOR) 150 MG tablet Take 1 tablet (150 mg total) by mouth 2 (two) times daily. 11/10/17   Camnitz, Andree Coss, MD  metFORMIN (GLUCOPHAGE) 500 MG tablet Take by mouth 2 (two) times daily with a meal.    [provider]  oxyCODONE-acetaminophen (PERCOCET) 5-325 MG tablet Take 1 tablet by mouth every 6 (six) hours as needed for severe pain. 07/04/16   Elson Areas, PA-C  rosuvastatin (CRESTOR) 10 MG tablet Take 1 tablet (10 mg total) by mouth daily. 03/21/18 07/23/18  Camnitz, Andree Coss, MD  tamsulosin (FLOMAX) 0.4 MG CAPS capsule Take 0.4 mg by mouth daily.    [provider]    Family History Family History  Problem Relation Age of Onset  . Hypertension Mother   . Heart attack Father     Social History Social History   Tobacco Use  . Smoking status: Current Every Day Smoker    Packs/day: 0.50  Years: 15.00    Pack years: 7.50    Types: Cigarettes  . Smokeless tobacco: Never Used  Substance Use Topics  . Alcohol use: No  . Drug use: No     Allergies   Bactrim [sulfamethoxazole-trimethoprim]; Clindamycin/lincomycin; Metoprolol; Penicillins; Pineapple; Toradol [ketorolac tromethamine]; Tramadol; and Vicodin [hydrocodone-acetaminophen]   Review of Systems Review of Systems  Constitutional: Negative for chills and fever.  HENT: Negative for facial swelling and sore throat.   Respiratory: Negative for shortness of breath.   Cardiovascular: Negative for chest pain.  Gastrointestinal: Negative for abdominal pain, nausea and vomiting.  Genitourinary: Negative for dysuria.  Musculoskeletal: Positive for neck pain (posterior over area of swelling). Negative for back pain  and neck stiffness.  Skin: Positive for wound. Negative for rash.  Neurological: Negative for headaches.  Psychiatric/Behavioral: The patient is not nervous/anxious.      Physical Exam Updated Vital Signs BP 117/72   Pulse 73   Temp 97.8 F (36.6 C) (Oral)   Resp 18   Ht 5\' 10"  (1.778 m)   Wt 97.5 kg   SpO2 99%   BMI 30.85 kg/m   Physical Exam Vitals signs and nursing note reviewed.  Constitutional:      General: He is not in acute distress.    Appearance: He is well-developed. He is not diaphoretic.  HENT:     Head: Normocephalic and atraumatic.     Comments: Crusting to the left ear lobe at most proximal attachment (chronic)    Mouth/Throat:     Pharynx: No oropharyngeal exudate.  Eyes:     General: No scleral icterus.       Right eye: No discharge.        Left eye: No discharge.     Conjunctiva/sclera: Conjunctivae normal.     Pupils: Pupils are equal, round, and reactive to light.  Neck:     Musculoskeletal: Normal range of motion and neck supple.     Thyroid: No thyromegaly.   Cardiovascular:     Rate and Rhythm: Normal rate and regular rhythm.     Heart sounds: Normal heart sounds. No murmur. No friction rub. No gallop.   Pulmonary:     Effort: Pulmonary effort is normal. No respiratory distress.     Breath sounds: Normal breath sounds. No stridor. No wheezing or rales.  Abdominal:     General: Bowel sounds are normal. There is no distension.     Palpations: Abdomen is soft.     Tenderness: There is no abdominal tenderness. There is no guarding or rebound.  Lymphadenopathy:     Cervical: No cervical adenopathy.  Skin:    General: Skin is warm and dry.     Coloration: Skin is not pale.     Findings: No rash.  Neurological:     Mental Status: He is alert.     Coordination: Coordination normal.      ED Treatments / Results  Labs (all labs ordered are listed, but only abnormal results are displayed) Labs Reviewed  BASIC METABOLIC PANEL - Abnormal;  Notable for the following components:      Result Value   Glucose, Bld 107 (*)    All other components within normal limits  CBC WITH DIFFERENTIAL/PLATELET    EKG None  Radiology Ct Soft Tissue Neck W Contrast  Result Date: 08/06/2018 CLINICAL DATA:  Painful swelling left posterior neck. EXAM: CT NECK WITH CONTRAST TECHNIQUE: Multidetector CT imaging of the neck was performed using the standard protocol following  the bolus administration of intravenous contrast. CONTRAST:  OMNIPAQUE IOHEXOL 300 MG/ML  SOLN COMPARISON:  CT head FINDINGS: Pharynx and larynx: Normal. No mass or swelling. Salivary glands: No inflammation, mass, or stone. Thyroid: Negative Lymph nodes: No enlarged lymph nodes in the neck. Vascular: Normal vascular enhancement. Limited intracranial: Negative Visualized orbits: Negative Mastoids and visualized paranasal sinuses: Negative Skeleton: Mild disc degeneration and spurring C5-6 and C6-7. No acute skeletal abnormality. Dental caries in the molars bilaterally. Upper chest: Lung apices clear bilaterally. Other: Focal dermal thickening in the left posterior neck at the level of foramen magnum measuring approximately 1 cm. Small central lucency may represent an abscess. Mild surrounding edema. IMPRESSION: 1 cm area of soft tissue thickening in the left posterior neck with central lucency probably represents a dermal abscess. No pharyngeal mass or adenopathy in the neck. Electronically Signed   By: Marlan Palau M.D.   On: 08/06/2018 15:00    Procedures Ultrasound ED Soft Tissue Date/Time: 08/06/2018 8:06 PM Performed by: Emi Holes, PA-C Authorized by: Emi Holes, PA-C   Procedure details:    Indications: localization of abscess     Transverse view:  Visualized   Longitudinal view:  Visualized   Images: archived   Location:    Location: neck     Side:  Left Findings:     abscess present (<0.5cm)   (including critical care time)  Medications Ordered in  ED Medications  iohexol (OMNIPAQUE) 300 MG/ML solution 100 mL (100 mLs Intravenous Contrast Given 08/06/18 1435)  morphine 4 MG/ML injection 4 mg (4 mg Intravenous Given 08/06/18 1449)  ondansetron (ZOFRAN) injection 4 mg (4 mg Intravenous Given 08/06/18 1448)  oxyCODONE-acetaminophen (PERCOCET/ROXICET) 5-325 MG per tablet 2 tablet (2 tablets Oral Given 08/06/18 1545)     Initial Impression / Assessment and Plan / ED Course  I have reviewed the triage vital signs and the nursing notes.  Pertinent labs & imaging results that were available during my care of the patient were reviewed by me and considered in my medical decision making (see chart for details).        Patient presenting with probable developing abscess and cellulitis.  Considering patient's tenderness and pain, CT was conducted which showed 1 cm area of soft tissue thickening in the left posterior neck with central lucency probably represents a dermal abscess; no pharyngeal mass or adenopathy in the neck.  Labs are unremarkable.  I offered attempt at incision and drainage although no significant fluid seen on ultrasound and no fluctuance, however patient would like to try antibiotics first and warm compresses.  Follow-up here, PCP, or Derm if worsening or if becoming larger for I&D.  Will initiate doxycycline, as patient has history of MRSA.  Patient understands and agrees with plan.  Patient vital stable throughout ED course and discharged in satisfactory condition. I discussed patient case with Dr. Rush Landmark who guided the patient's management and agrees with plan.   Final Clinical Impressions(s) / ED Diagnoses   Final diagnoses:  Cellulitis and abscess of neck    ED Discharge Orders         Ordered    doxycycline (VIBRAMYCIN) 100 MG capsule  2 times daily     08/06/18 [address] [place] 08/06/18 2008    Tegeler, Canary Brim, MD 08/07/18 [id number]

## 2018-10-11 ENCOUNTER — Other Ambulatory Visit: Payer: Self-pay | Admitting: Cardiology

## 2018-10-11 MED ORDER — FLECAINIDE ACETATE 150 MG PO TABS: 150.0000 mg | ORAL_TABLET | Freq: Two times a day (BID) | ORAL | 0 refills | Status: DC

## 2018-10-23 ENCOUNTER — Other Ambulatory Visit: Payer: Self-pay | Admitting: Cardiology

## 2018-10-23 MED ORDER — ROSUVASTATIN CALCIUM 10 MG PO TABS: 10.0000 mg | ORAL_TABLET | Freq: Every day | ORAL | 0 refills | Status: DC

## 2018-10-25 ENCOUNTER — Telehealth: Payer: Self-pay | Admitting: *Deleted

## 2018-10-25 NOTE — Telephone Encounter (Signed)
Virtual Visit Pre-Appointment Phone Call  "(Name), I am calling you today to discuss your upcoming appointment. We are currently trying to limit exposure to the virus that causes COVID-19 by seeing patients at home rather than in the office."  1. "What is the BEST phone number to call the day of the visit?" - include this in appointment notes  2. "Do you have or have access to (through a family member/friend) a smartphone with video capability that we can use for your visit?" a. If yes - list this number in appt notes as "cell" (if different from BEST phone #) and list the appointment type as a VIDEO visit in appointment notes b. If no - list the appointment type as a PHONE visit in appointment notes  3. Confirm consent - "In the setting of the current Covid19 crisis, you are scheduled for a (phone or video) visit with your provider on (date) at (time).  Just as we do with many in-office visits, in order for you to participate in this visit, we must obtain consent.  If you'd like, I can send this to your mychart (if signed up) or email for you to review.  Otherwise, I can obtain your verbal consent now.  All virtual visits are billed to your insurance company just like a normal visit would be.  By agreeing to a virtual visit, we'd like you to understand that the technology does not allow for your provider to perform an examination, and thus may limit your provider's ability to fully assess your condition. If your provider identifies any concerns that need to be evaluated in person, we will make arrangements to do so.  Finally, though the technology is pretty good, we cannot assure that it will always work on either your or our end, and in the setting of a video visit, we may have to convert it to a phone-only visit.  In either situation, we cannot ensure that we have a secure connection.  Are you willing to proceed?" STAFF: Did the patient verbally acknowledge consent to telehealth visit? Document  YES/NO here: YES  4. Advise patient to be prepared - "Two hours prior to your appointment, go ahead and check your blood pressure, pulse, oxygen saturation, and your weight (if you have the equipment to check those) and write them all down. When your visit starts, your provider will ask you for this information. If you have an Apple Watch or Kardia device, please plan to have heart rate information ready on the day of your appointment. Please have a pen and paper handy nearby the day of the visit as well."  5. Give patient instructions for MyChart download to smartphone OR Doximity/Doxy.me as below if video visit (depending on what platform provider is using)  6. Inform patient they will receive a phone call 15 minutes prior to their appointment time (may be from unknown caller ID) so they should be prepared to answer    TELEPHONE CALL NOTE  Terrance Hodges has been deemed a candidate for a follow-up tele-health visit to limit community exposure during the Covid-19 pandemic. I spoke with the patient via phone to ensure availability of phone/video source, confirm preferred email & phone number, and discuss instructions and expectations.  I reminded Terrance Hodges to be prepared with any vital sign and/or heart rhythm information that could potentially be obtained via home monitoring, at the time of his visit. I reminded Terrance Hodges to expect a phone call prior to  his visit.  Baird LyonsPrice, [name], RN 10/25/2018 11:55 AM   INSTRUCTIONS FOR DOWNLOADING THE MYCHART APP TO SMARTPHONE  - The patient must first make sure to have activated MyChart and know their login information - If Apple, go to [employer] Store and type in MyChart in the search bar and download the app. If Android, ask patient to go to [employer] Play Store and type in ColpMyChart in the search bar and download the app. The app is free but as with any other app downloads, their phone may require them to verify saved payment information or Apple/Android  password.  - The patient will need to then log into the app with their MyChart username and password, and select [facility] as their healthcare provider to link the account. When it is time for your visit, go to the MyChart app, find appointments, and click Begin Video Visit. Be sure to Select Allow for your device to access the Microphone and Camera for your visit. You will then be connected, and your provider will be with you shortly.  **If they have any issues connecting, or need assistance please contact MyChart service desk [phone])**  **If using a computer, in order to ensure the best quality for their visit they will need to use either of the following Internet Browsers: [employer] Edge, or Google Chrome**  IF USING DOXIMITY or DOXY.ME - The patient will receive a link just prior to their visit by text.     FULL LENGTH CONSENT FOR TELE-HEALTH VISIT   I hereby voluntarily request, consent and authorize [facility] and its employed or contracted physicians, physician assistants, nurse practitioners or other licensed health care professionals (the Practitioner), to provide me with telemedicine health care services (the "Services") as deemed necessary by the treating Practitioner. I acknowledge and consent to receive the Services by the Practitioner via telemedicine. I understand that the telemedicine visit will involve communicating with the Practitioner through live audiovisual communication technology and the disclosure of certain medical information by electronic transmission. I acknowledge that I have been given the opportunity to request an in-person assessment or other available alternative prior to the telemedicine visit and am voluntarily participating in the telemedicine visit.  I understand that I have the right to withhold or withdraw my consent to the use of telemedicine in the course of my care at any time, without affecting my right to future care or treatment,  and that the Practitioner or I may terminate the telemedicine visit at any time. I understand that I have the right to inspect all information obtained and/or recorded in the course of the telemedicine visit and may receive copies of available information for a reasonable fee.  I understand that some of the potential risks of receiving the Services via telemedicine include:  [name]. Delay or interruption in medical evaluation due to technological equipment failure or disruption; . Information transmitted may not be sufficient (e.g. poor resolution of images) to allow for appropriate medical decision making by the Practitioner; and/or  . In rare instances, security protocols could fail, causing a breach of personal health information.  Furthermore, I acknowledge that it is my responsibility to provide information about my medical history, conditions and care that is complete and accurate to the best of my ability. I acknowledge that Practitioner's advice, recommendations, and/or decision may be based on factors not within their control, such as incomplete or inaccurate data provided by me or distortions of diagnostic images or specimens that may result from electronic transmissions.  I understand that the practice of medicine is not an exact science and that Practitioner makes no warranties or guarantees regarding treatment outcomes. I acknowledge that I will receive a copy of this consent concurrently upon execution via email to the email address I last provided but may also request a printed copy by calling the office of [facility].    I understand that my insurance will be billed for this visit.   I have read or had this consent read to me. . I understand the contents of this consent, which adequately explains the benefits and risks of the Services being provided via telemedicine.  . I have been provided ample opportunity to ask questions regarding this consent and the Services and have had my questions  answered to my satisfaction. . I give my informed consent for the services to be provided through the use of telemedicine in my medical care  By participating in this telemedicine visit I agree to the above.

## 2018-10-26 ENCOUNTER — Other Ambulatory Visit: Payer: Self-pay | Admitting: Cardiology

## 2018-10-26 MED ORDER — CARVEDILOL 6.25 MG PO TABS: 6.2500 mg | ORAL_TABLET | Freq: Two times a day (BID) | ORAL | 0 refills | Status: DC

## 2018-11-02 ENCOUNTER — Other Ambulatory Visit: Payer: Self-pay

## 2018-11-02 ENCOUNTER — Telehealth (INDEPENDENT_AMBULATORY_CARE_PROVIDER_SITE_OTHER): Payer: BLUE CROSS/BLUE SHIELD | Admitting: Cardiology

## 2018-11-02 DIAGNOSIS — I471 Supraventricular tachycardia: Secondary | ICD-10-CM | POA: Diagnosis not present

## 2018-11-02 MED ORDER — DILTIAZEM HCL ER COATED BEADS 360 MG PO CP24: 360.0000 mg | ORAL_CAPSULE | Freq: Every day | ORAL | 3 refills | Status: DC

## 2018-11-02 MED ORDER — CARVEDILOL 6.25 MG PO TABS: 6.2500 mg | ORAL_TABLET | Freq: Two times a day (BID) | ORAL | 3 refills | Status: DC

## 2018-11-02 MED ORDER — FLECAINIDE ACETATE 150 MG PO TABS: 150.0000 mg | ORAL_TABLET | Freq: Two times a day (BID) | ORAL | 3 refills | Status: DC

## 2018-11-02 NOTE — Progress Notes (Signed)
Electrophysiology TeleHealth Note   Due to national recommendations of social distancing due to COVID 19, an audio/video telehealth visit is felt to be most appropriate for this patient at this time.  See Epic message for the patient's consent to telehealth for [facility].   Date:  11/02/2018   ID:  Terrance Hodges, DOB [birth year], MRN [id number]  Location: patient's home  Provider location: [address] [place]  Evaluation Performed: Follow-up visit  PCP:  Karle Plumber, MD  Cardiologist:  Abrahm Mancia Jorja Loa, MD  Electrophysiologist:  Dr Elberta Fortis  Chief Complaint:  SVT  History of Present Illness:    Terrance Hodges is a 43 y.o. male who presents via audio/video conferencing for a telehealth visit today.  Since last being seen in our clinic, the patient reports doing very well.  Today, he denies symptoms of palpitations, chest pain, shortness of breath,  lower extremity edema, dizziness, presyncope, or syncope.  The patient is otherwise without complaint today.  The patient denies symptoms of fevers, chills, cough, or new SOB worrisome for COVID 19.  History of SVT which has responded to valsalva.   Today, denies symptoms of palpitations, chest pain, shortness of breath, orthopnea, PND, lower extremity edema, claudication, dizziness, presyncope, syncope, bleeding, or neurologic sequela. The patient is tolerating medications without difficulties.  He has noted no further episodes of SVT since last being seen.  Past Medical History:  Diagnosis Date  . A-fib (HCC)   . Diabetes mellitus without complication (HCC)   . History of hematuria   . Hypertension   . Prostatitis   . Reflux   . SVT (supraventricular tachycardia) (HCC)     Past Surgical History:  Procedure Laterality Date  . ADENOIDECTOMY    . KNEE SURGERY    . TONSILLECTOMY    . URETER SURGERY    . WISDOM TOOTH EXTRACTION      Current Outpatient Medications  Medication Sig Dispense Refill  .  carvedilol (COREG) 6.25 MG tablet Take 1 tablet (6.25 mg total) by mouth 2 (two) times daily. Please keep upcoming appt in May with Dr. Elberta Fortis for future refills. Thank you 180 tablet 0  . dexlansoprazole (DEXILANT) 60 MG capsule Take 60 mg by mouth daily.    [name] diltiazem (CARDIZEM CD) 360 MG 24 hr capsule TAKE 1 CAPSULE BY MOUTH DAILY. *MUST MAKE APPT FOR MORE REFILLS 90 capsule 3  . doxycycline (VIBRAMYCIN) 100 MG capsule Take 1 capsule (100 mg total) by mouth 2 (two) times daily. 20 capsule 0  . fenofibrate micronized (LOFIBRA) 134 MG capsule Take 134 mg by mouth daily before breakfast.    . flecainide (TAMBOCOR) 150 MG tablet Take 1 tablet (150 mg total) by mouth 2 (two) times daily. Please make overdue appt with Dr. Elberta Fortis before anymore refills. 1st attempt 180 tablet 0  . metFORMIN (GLUCOPHAGE) 500 MG tablet Take by mouth 2 (two) times daily with a meal.    . oxyCODONE-acetaminophen (PERCOCET) 5-325 MG tablet Take 1 tablet by mouth every 6 (six) hours as needed for severe pain. 16 tablet 0  . rosuvastatin (CRESTOR) 10 MG tablet Take 1 tablet (10 mg total) by mouth daily. Please keep upcoming appt with Dr. Elberta Fortis in May for future refills. Thank you 30 tablet 0  . tamsulosin (FLOMAX) 0.4 MG CAPS capsule Take 0.4 mg by mouth daily.     No current facility-administered medications for this visit.     Allergies:   Bactrim [sulfamethoxazole-trimethoprim];  Clindamycin/lincomycin; Metoprolol; Penicillins; Pineapple; Toradol [ketorolac tromethamine]; Tramadol; and Vicodin [hydrocodone-acetaminophen]   Social History:  The patient  reports that he has been smoking cigarettes. He has a 7.50 pack-year smoking history. He has never used smokeless tobacco. He reports that he does not drink alcohol or use drugs.   Family History:  The patient's  family history includes Heart attack in his father; Hypertension in his mother.   ROS:  Please see the history of present illness.   All other systems are  personally reviewed and negative.    Exam:    Vital Signs:  Pulse 81   SpO2 97%   Well appearing, alert and conversant, regular work of breathing,  good skin color Eyes- anicteric, neuro- grossly intact, skin- no apparent rash or lesions or cyanosis, mouth- oral mucosa is pink   Labs/Other Tests and Data Reviewed:    Recent Labs: 11/19/2017: B Natriuretic Peptide 6.6 08/06/2018: BUN 12; Creatinine, Ser 0.95; Hemoglobin 14.6; Platelets 324; Potassium 3.9; Sodium 137   Wt Readings from Last 3 Encounters:  08/06/18 215 lb (97.5 kg)  07/23/18 215 lb (97.5 kg)  03/09/18 226 lb (102.5 kg)     Other studies personally reviewed: Additional studies/ records that were reviewed today include: ECG 01/19/18 personally reviewed  Review of the above records today demonstrates:  Sinus rhythm  ASSESSMENT & PLAN:    1.  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 On flecainide and diltiazem which is also treating AF.  No further episodes.  No changes.  He [name] need refills of his cardiac medications.  2. Paroxysmal atrial fibrillaiton: per patient has had AF in the past. On flecainide and diltiazem.  No further obvious episodes.   This patients CHA2DS2-VASc Score and unadjusted Ischemic Stroke Rate (% per year) is equal to 0.2 % stroke rate/year from a score of 0  Above score calculated as 1 point each if present [CHF, HTN, DM, Vascular=MI/PAD/Aortic Plaque, Age if 65-74, or Male] Above score calculated as 2 points each if present [Age > 75, or Stroke/TIA/TE]   COVID 19 screen The patient denies symptoms of COVID 19 at this time.  The importance of social distancing was discussed today.  Follow-up: 1 year  Current medicines are reviewed at length with the patient today.   The patient does not have concerns regarding his medicines.  The following changes were made today:  none  Labs/ tests ordered today include:  No orders of the defined types were placed in this encounter.    Patient  Risk:  after full review of this patients clinical status, I feel that they are at moderate risk at this time.  Today, I have spent 11 minutes with the patient with telehealth technology discussing SVT.    Signed, Brynn Reznik Jorja LoaMartin Oprah Camarena, MD  11/02/2018 12:21 PM     [facility]: [facility] [address] [place] [address] [phone] (office) [phone] (fax)

## 2018-11-02 NOTE — Addendum Note (Signed)
Addended by: Baird Lyons on: 11/02/2018 01:29 PM   Modules accepted: Orders

## 2018-11-15 ENCOUNTER — Other Ambulatory Visit: Payer: Self-pay | Admitting: Cardiology

## 2019-02-07 ENCOUNTER — Other Ambulatory Visit: Payer: Self-pay | Admitting: Cardiology

## 2019-04-15 ENCOUNTER — Other Ambulatory Visit: Payer: Self-pay | Admitting: Cardiology

## 2019-04-15 NOTE — Progress Notes (Signed)
     Received outpatient call from patient's wife who states that he forgot to pick up his flecainide from the pharmacy this evening.  This means he will miss his evening and morning doses.  Also noted to be on carvedilol 6.25 mg as well as diltiazem 360 mg.  Instructed to double evening dose of carvedilol to 12.5 mg as well as a.m. dose.  Wife reports BP is stable.  No reports of palpitations or other symptoms at this time.  Wife reports flecainide has already been called in and is waiting at the pharmacy but unfortunately it is not a 24-hour pharmacy and they have no transportation if sent elsewhere.  Instructed to call answering service back if issues overnight.  Kathyrn Drown NP-C [place] Pager: [phone]

## 2019-05-09 ENCOUNTER — Emergency Department (HOSPITAL_BASED_OUTPATIENT_CLINIC_OR_DEPARTMENT_OTHER)
Admission: EM | Admit: 2019-05-09 | Discharge: 2019-05-09 | Disposition: A | Discharge status: Home / Self Care | Payer: BLUE CROSS/BLUE SHIELD | Attending: Emergency Medicine | Admitting: Emergency Medicine

## 2019-05-09 ENCOUNTER — Other Ambulatory Visit: Payer: Self-pay

## 2019-05-09 ENCOUNTER — Encounter (HOSPITAL_BASED_OUTPATIENT_CLINIC_OR_DEPARTMENT_OTHER): Payer: Self-pay | Admitting: Emergency Medicine

## 2019-05-09 DIAGNOSIS — I10 Essential (primary) hypertension: Secondary | ICD-10-CM | POA: Diagnosis not present

## 2019-05-09 DIAGNOSIS — E119 Type 2 diabetes mellitus without complications: Secondary | ICD-10-CM | POA: Diagnosis not present

## 2019-05-09 DIAGNOSIS — K0889 Other specified disorders of teeth and supporting structures: Secondary | ICD-10-CM | POA: Insufficient documentation

## 2019-05-09 DIAGNOSIS — Z888 Allergy status to other drugs, medicaments and biological substances status: Secondary | ICD-10-CM | POA: Insufficient documentation

## 2019-05-09 DIAGNOSIS — F17210 Nicotine dependence, cigarettes, uncomplicated: Secondary | ICD-10-CM | POA: Diagnosis not present

## 2019-05-09 DIAGNOSIS — Z881 Allergy status to other antibiotic agents status: Secondary | ICD-10-CM | POA: Insufficient documentation

## 2019-05-09 DIAGNOSIS — Z7984 Long term (current) use of oral hypoglycemic drugs: Secondary | ICD-10-CM | POA: Insufficient documentation

## 2019-05-09 DIAGNOSIS — Z885 Allergy status to narcotic agent status: Secondary | ICD-10-CM | POA: Diagnosis not present

## 2019-05-09 DIAGNOSIS — Z79899 Other long term (current) drug therapy: Secondary | ICD-10-CM | POA: Insufficient documentation

## 2019-05-09 DIAGNOSIS — Z880 Allergy status to penicillin: Secondary | ICD-10-CM | POA: Insufficient documentation

## 2019-05-09 DIAGNOSIS — Z882 Allergy status to sulfonamides status: Secondary | ICD-10-CM | POA: Diagnosis not present

## 2019-05-09 DIAGNOSIS — I4891 Unspecified atrial fibrillation: Secondary | ICD-10-CM | POA: Diagnosis not present

## 2019-05-09 NOTE — ED Triage Notes (Signed)
Pt reports lower tooth pain x 1 week. Here for dentist referral

## 2019-05-09 NOTE — ED Provider Notes (Addendum)
[facility] EMERGENCY DEPARTMENT Provider Note   CSN: [id number] Arrival date & time: 05/09/19  0820     History   Chief Complaint Chief Complaint  Patient presents with  . Dental Pain    HPI Terrance Hodges is a 43 y.o. male.     The history is provided by the patient.  Dental Pain Location:  Lower Quality:  Aching Severity:  Mild Onset quality:  Gradual Duration:  1 week Timing:  Intermittent Progression:  Waxing and waning Chronicity:  New Context: dental caries   Worsened by:  Nothing Ineffective treatments:  None tried Associated symptoms: no congestion, no difficulty swallowing, no drooling, no facial pain, no facial swelling, no fever, no gum swelling, no headaches, no neck swelling, no oral bleeding, no oral lesions and no trismus     Past Medical History:  Diagnosis Date  . A-fib (HCC)   . Diabetes mellitus without complication (HCC)   . History of hematuria   . Hypertension   . Prostatitis   . Reflux   . SVT (supraventricular tachycardia) [facility])     Patient Active Problem List   Diagnosis Date Noted  . Stridor 03/09/2018  . Hypoxemia 03/09/2018  . Edema 03/09/2018  . SVT (supraventricular tachycardia) (HCC) 04/09/2016    Past Surgical History:  Procedure Laterality Date  . ADENOIDECTOMY    . KNEE SURGERY    . TONSILLECTOMY    . URETER SURGERY    . WISDOM TOOTH EXTRACTION          Home Medications    Prior to Admission medications   Medication Sig Start Date End Date Taking? Authorizing Provider  carvedilol (COREG) 6.25 MG tablet Take 1 tablet (6.25 mg total) by mouth 2 (two) times daily. 11/02/18   Camnitz, Will Daphine Deutscher, MD  dexlansoprazole (DEXILANT) 60 MG capsule Take 60 mg by mouth daily. 05/12/17   [provider]  diltiazem (CARDIZEM CD) 360 MG 24 hr capsule Take 1 capsule (360 mg total) by mouth daily. 11/02/18   Camnitz, Andree Coss, MD  doxycycline (VIBRAMYCIN) 100 MG capsule Take 1 capsule (100 mg total) by mouth  2 (two) times daily. 08/06/18   Law, Waylan Boga, PA-C  fenofibrate micronized (LOFIBRA) 134 MG capsule Take 134 mg by mouth daily before breakfast.    [provider]  flecainide (TAMBOCOR) 150 MG tablet Take 1 tablet (150 mg total) by mouth 2 (two) times daily. 11/02/18   Camnitz, Andree Coss, MD  metFORMIN (GLUCOPHAGE) 500 MG tablet Take by mouth 2 (two) times daily with a meal.    [provider]  oxyCODONE-acetaminophen (PERCOCET) 5-325 MG tablet Take 1 tablet by mouth every 6 (six) hours as needed for severe pain. 07/04/16   Elson Areas, PA-C  rosuvastatin (CRESTOR) 10 MG tablet TAKE 1 TABLET BY MOUTH EVERY DAY 02/07/19   Camnitz, Andree Coss, MD  tamsulosin (FLOMAX) 0.4 MG CAPS capsule Take 0.4 mg by mouth daily.    [provider]    Family History Family History  Problem Relation Age of Onset  . Hypertension Mother   . Heart attack Father     Social History Social History   Tobacco Use  . Smoking status: Current Every Day Smoker    Packs/day: 0.50    Years: 15.00    Pack years: 7.50    Types: Cigarettes  . Smokeless tobacco: Never Used  Substance Use Topics  . Alcohol use: No  . Drug use: No  Allergies   Bactrim [sulfamethoxazole-trimethoprim], Clindamycin/lincomycin, Metoprolol, Penicillins, Pineapple, Toradol [ketorolac tromethamine], Tramadol, and Vicodin [hydrocodone-acetaminophen]   Review of Systems Review of Systems  Constitutional: Negative for fever.  HENT: Negative for congestion, drooling, facial swelling and mouth sores.   Neurological: Negative for headaches.     Physical Exam Updated Vital Signs  ED Triage Vitals  Enc Vitals Group     BP 05/09/19 0828 122/78     Pulse Rate 05/09/19 0828 76     Resp 05/09/19 0828 19     Temp 05/09/19 0828 98.5 F (36.9 C)     Temp Source 05/09/19 0828 Oral     SpO2 05/09/19 0828 100 %     Weight 05/09/19 0827 210 lb (95.3 kg)     Height 05/09/19 0827 5\' 10"  (1.778 m)     Head  Circumference --      Peak Flow --      Pain Score 05/09/19 0827 7     Pain [name] --      Pain Edu? --      Excl. in [place]? --     Physical Exam Constitutional:      General: He is not in acute distress.    Appearance: He is not ill-appearing.  HENT:     Head: Normocephalic.     Nose: Nose normal.     Mouth/Throat:     Mouth: Mucous membranes are moist.     Comments: Poor dentition throughout, no obvious infectious process, no obvious swelling of the mouth or tongue Neck:     Musculoskeletal: Normal range of motion.  Neurological:     Mental Status: He is alert.      ED Treatments / Results  Labs (all labs ordered are listed, but only abnormal results are displayed) Labs Reviewed - No data to display  EKG None  Radiology No results found.  Procedures Procedures (including critical care time)  Medications Ordered in ED Medications - No data to display   Initial Impression / Assessment and Plan / ED Course  I have reviewed the triage vital signs and the nursing notes.  Pertinent labs & imaging results that were available during my care of the patient were reviewed by me and considered in my medical decision making (see chart for details).        Terrance Hodges is a 43 year old male with history of A. fib who presents to the ED with lower tooth pain for the past week.  Normal vitals.  No fever.  Patient with poor dentition in the lower teeth.  No obvious abscess.  No fever.  States that he has follow-up with a dentist today but if he comes to the emergency department it will help with coverage with for possible procedures today.  Overall everything in his mouth looks fairly chronic.  Would likely benefit from tooth extraction for several teeth.  Discharged from ED in good condition.  We will hold off on any antibiotics given that he is to see dentistry today.  Given return precautions.  This chart was dictated using voice recognition software.  Despite best efforts to  proofread,  errors can occur which can change the documentation meaning.    Final Clinical Impressions(s) / ED Diagnoses   Final diagnoses:  Pain, dental    ED Discharge Orders    None       Lennice Sites, DO 05/09/19 [id number]    Lennice Sites, DO 05/09/19 [phone]

## 2019-05-09 NOTE — Discharge Instructions (Addendum)
Follow up with dentist as scheduled.

## 2019-05-17 ENCOUNTER — Other Ambulatory Visit: Payer: Self-pay | Admitting: Pulmonary Disease

## 2019-05-17 DIAGNOSIS — G4734 Idiopathic sleep related nonobstructive alveolar hypoventilation: Secondary | ICD-10-CM

## 2019-06-11 ENCOUNTER — Ambulatory Visit (INDEPENDENT_AMBULATORY_CARE_PROVIDER_SITE_OTHER)
Admission: EM | Admit: 2019-06-11 | Discharge: 2019-06-11 | Disposition: A | Discharge status: Home / Self Care | Payer: BC Managed Care – PPO | Source: Home / Self Care | Attending: Family Medicine | Admitting: Family Medicine

## 2019-06-11 ENCOUNTER — Other Ambulatory Visit: Payer: Self-pay

## 2019-06-11 ENCOUNTER — Emergency Department (HOSPITAL_BASED_OUTPATIENT_CLINIC_OR_DEPARTMENT_OTHER)
Admission: EM | Admit: 2019-06-11 | Discharge: 2019-06-11 | Disposition: A | Discharge status: Home / Self Care | Payer: BC Managed Care – PPO | Attending: Emergency Medicine | Admitting: Emergency Medicine

## 2019-06-11 ENCOUNTER — Encounter (HOSPITAL_BASED_OUTPATIENT_CLINIC_OR_DEPARTMENT_OTHER): Payer: Self-pay

## 2019-06-11 ENCOUNTER — Encounter (HOSPITAL_COMMUNITY): Payer: Self-pay

## 2019-06-11 DIAGNOSIS — K0889 Other specified disorders of teeth and supporting structures: Secondary | ICD-10-CM | POA: Diagnosis present

## 2019-06-11 DIAGNOSIS — Z5321 Procedure and treatment not carried out due to patient leaving prior to being seen by health care provider: Secondary | ICD-10-CM | POA: Insufficient documentation

## 2019-06-11 DIAGNOSIS — K0510 Chronic gingivitis, plaque induced: Secondary | ICD-10-CM

## 2019-06-11 DIAGNOSIS — K029 Dental caries, unspecified: Secondary | ICD-10-CM

## 2019-06-11 NOTE — ED Triage Notes (Signed)
Pt c/o dental pain to right right and left lower-NAD-steady gait

## 2019-06-11 NOTE — ED Triage Notes (Signed)
Pt. States he just needs paperwork showing he was seen for dental pain at a [place] facility so [facility] will pay for his dental services, his appointment is for tomorrow.

## 2019-06-11 NOTE — ED Notes (Signed)
Pt called wife-she states pt has speech impediment-she states pt needs a noted stating that he was seen today for insurance to pay for dentist appt tomorrow-advised of long wait to be seen-pt and wife state they will advise staff if pt LWBS

## 2019-06-11 NOTE — ED Provider Notes (Signed)
[facility]    CSN: [id number] Arrival date & time: 06/11/19  1555      History   Chief Complaint Chief Complaint  Patient presents with  . Dental Pain    HPI Terrance Hodges is a 44 y.o. male.   HPI  Patient has dental pain.  Periodontal disease.  Dental fractures.  He is supposed to see a Patent examiner.  He wonders what he can take for pain.  We discussed over-the-counter pain management with Tylenol and ibuprofen.  Hygiene.  Rinsing.  He wants to know whether there is an infection that would require antibiotics prior to his dental visit  Past Medical History:  Diagnosis Date  . A-fib (HCC)   . Diabetes mellitus without complication (HCC)   . History of hematuria   . Hypertension   . Prostatitis   . Reflux   . SVT (supraventricular tachycardia) [facility])     Patient Active Problem List   Diagnosis Date Noted  . Stridor 03/09/2018  . Hypoxemia 03/09/2018  . Edema 03/09/2018  . SVT (supraventricular tachycardia) (HCC) 04/09/2016    Past Surgical History:  Procedure Laterality Date  . ADENOIDECTOMY    . KNEE SURGERY    . TONSILLECTOMY    . URETER SURGERY    . WISDOM TOOTH EXTRACTION         Home Medications    Prior to Admission medications   Medication Sig Start Date End Date Taking? Authorizing Provider  Suvorexant 15 MG TABS Take by mouth.   Yes [provider]  carvedilol (COREG) 6.25 MG tablet Take 1 tablet (6.25 mg total) by mouth 2 (two) times daily. 11/02/18   Camnitz, Will Daphine Deutscher, MD  dexlansoprazole (DEXILANT) 60 MG capsule Take 60 mg by mouth daily. 05/12/17   [provider]  diltiazem (CARDIZEM CD) 360 MG 24 hr capsule Take 1 capsule (360 mg total) by mouth daily. 11/02/18   Camnitz, Andree Coss, MD  fenofibrate micronized (LOFIBRA) 134 MG capsule Take 134 mg by mouth daily before breakfast.    [provider]  flecainide (TAMBOCOR) 150 MG tablet Take 1 tablet (150 mg total) by mouth 2 (two) times daily. 11/02/18    Camnitz, Will Daphine Deutscher, MD  metFORMIN (GLUCOPHAGE) 500 MG tablet Take by mouth 2 (two) times daily with a meal.    [provider]  rosuvastatin (CRESTOR) 10 MG tablet TAKE 1 TABLET BY MOUTH EVERY DAY 02/07/19   Camnitz, Andree Coss, MD  tamsulosin (FLOMAX) 0.4 MG CAPS capsule Take 0.4 mg by mouth daily.    [provider]    Family History Family History  Problem Relation Age of Onset  . Hypertension Mother   . Heart attack Father     Social History Social History   Tobacco Use  . Smoking status: Current Every Day Smoker    Packs/day: 0.50    Years: 15.00    Pack years: 7.50    Types: Cigarettes  . Smokeless tobacco: Never Used  Substance Use Topics  . Alcohol use: No  . Drug use: No     Allergies   Bactrim [sulfamethoxazole-trimethoprim], Clindamycin/lincomycin, Metoprolol, Penicillins, Pineapple, Toradol [ketorolac tromethamine], Tramadol, and Vicodin [hydrocodone-acetaminophen]   Review of Systems Review of Systems  Constitutional: Negative for chills and fever.  HENT: Positive for dental problem. Negative for congestion and hearing loss.   Eyes: Negative for pain.  Respiratory: Negative for cough and shortness of breath.   Cardiovascular: Negative for chest pain and leg swelling.  Gastrointestinal: Negative for abdominal pain, constipation and diarrhea.  Genitourinary: Negative for dysuria and frequency.  Musculoskeletal: Negative for myalgias.  Neurological: Negative for dizziness, seizures and headaches.  Psychiatric/Behavioral: The patient is not nervous/anxious.      Physical Exam Triage Vital Signs ED Triage Vitals  Enc Vitals Group     BP 06/11/19 1725 114/68     Pulse Rate 06/11/19 1725 87     Resp 06/11/19 1725 18     Temp 06/11/19 1725 98.4 F (36.9 C)     Temp Source 06/11/19 1725 Oral     SpO2 06/11/19 1725 99 %     Weight 06/11/19 1723 210 lb (95.3 kg)     Height --      Head Circumference --      Peak Flow --      Pain  Score 06/11/19 1721 5     Pain [name] --      Pain Edu? --      Excl. in [place]? --    No data found.  Updated Vital Signs BP 114/68 (BP Location: Right Arm)   Pulse 87   Temp 98.4 F (36.9 C) (Oral)   Resp 18   Wt 95.3 kg   SpO2 99%   BMI 30.13 kg/m   Visual Acuity Right Eye Distance:   Left Eye Distance:   Bilateral Distance:    Right Eye Near:   Left Eye Near:    Bilateral Near:     Physical Exam Constitutional:      General: He is not in acute distress.    Appearance: He is well-developed and normal weight.  HENT:     Head: Normocephalic and atraumatic.     Mouth/Throat:     Comments: Poor dental condition.  Periodontal disease.  Gingivitis.  Plaque.  Dental fractures.  No gum erythema or swelling to indicate current abscess Eyes:     Conjunctiva/sclera: Conjunctivae normal.     Pupils: Pupils are equal, round, and reactive to light.  Cardiovascular:     Rate and Rhythm: Normal rate.  Pulmonary:     Effort: Pulmonary effort is normal. No respiratory distress.  Abdominal:     General: There is no distension.     Palpations: Abdomen is soft.  Musculoskeletal:        General: Normal range of motion.     Cervical back: Normal range of motion.  Skin:    General: Skin is warm and dry.  Neurological:     Mental Status: He is alert.  Psychiatric:     Comments: Stutters, speech impediment      UC Treatments / Results  Labs (all labs ordered are listed, but only abnormal results are displayed) Labs Reviewed - No data to display  EKG   Radiology No results found.  Procedures Procedures (including critical care time)  Medications Ordered in UC Medications - No data to display  Initial Impression / Assessment and Plan / UC Course  I have reviewed the triage vital signs and the nursing notes.  Pertinent labs & imaging results that were available during my care of the patient were reviewed by me and considered in my medical decision making (see chart for  details).     Antibiotic not indicated today. Final Clinical Impressions(s) / UC Diagnoses   Final diagnoses:  Pain, dental  Dental caries  Gingivitis     Discharge Instructions     Must follow up with dentist   ED Prescriptions  None     PDMP not reviewed this encounter.   Eustace Moore, MD 06/11/19 2139

## 2019-06-11 NOTE — Discharge Instructions (Signed)
Must follow up with dentist

## 2019-07-18 ENCOUNTER — Telehealth: Payer: Self-pay | Admitting: *Deleted

## 2019-07-18 NOTE — Telephone Encounter (Signed)
  Hey y'all. Merry Christmas!!! Hope y'all are doing well. I just wanted to give you Terrance Hodges latest lipid results that was done on 05/22/19. His DOB is 17-Apr-1976. Total Cholesterol-119 Triglycerides-59 HDL-49 LDL-58  Thank y'all so much with our care!!!

## 2019-09-25 ENCOUNTER — Emergency Department (HOSPITAL_BASED_OUTPATIENT_CLINIC_OR_DEPARTMENT_OTHER)
Admission: EM | Admit: 2019-09-25 | Discharge: 2019-09-25 | Disposition: A | Discharge status: Home / Self Care | Payer: BC Managed Care – PPO | Attending: Emergency Medicine | Admitting: Emergency Medicine

## 2019-09-25 ENCOUNTER — Other Ambulatory Visit: Payer: Self-pay

## 2019-09-25 ENCOUNTER — Encounter (HOSPITAL_BASED_OUTPATIENT_CLINIC_OR_DEPARTMENT_OTHER): Payer: Self-pay | Admitting: Emergency Medicine

## 2019-09-25 DIAGNOSIS — Z7984 Long term (current) use of oral hypoglycemic drugs: Secondary | ICD-10-CM | POA: Insufficient documentation

## 2019-09-25 DIAGNOSIS — Z79899 Other long term (current) drug therapy: Secondary | ICD-10-CM | POA: Diagnosis not present

## 2019-09-25 DIAGNOSIS — K0889 Other specified disorders of teeth and supporting structures: Secondary | ICD-10-CM | POA: Diagnosis not present

## 2019-09-25 DIAGNOSIS — F17210 Nicotine dependence, cigarettes, uncomplicated: Secondary | ICD-10-CM | POA: Diagnosis not present

## 2019-09-25 DIAGNOSIS — I10 Essential (primary) hypertension: Secondary | ICD-10-CM | POA: Insufficient documentation

## 2019-09-25 DIAGNOSIS — E119 Type 2 diabetes mellitus without complications: Secondary | ICD-10-CM | POA: Diagnosis not present

## 2019-09-25 MED ORDER — LIDOCAINE VISCOUS HCL 2 % MT SOLN
15.0000 mL | Freq: Once | OROMUCOSAL | Status: AC
  Administered 2019-09-25: 15 mL via OROMUCOSAL
  Filled 2019-09-25: qty 15

## 2019-09-25 NOTE — ED Provider Notes (Signed)
[facility] EMERGENCY DEPARTMENT Provider Note   CSN: [id number] Arrival date & time: 09/25/19  8177     History Chief Complaint  Patient presents with  . Dental referral    Terrance Hodges is a 44 y.o. male presenting for evaluation of dental pain.   Pt states about 1 wk ago his L upper tooth broke. Since then he has been having consistent L upper dental pain. He has been taking tylenol for this. He has an apt with a dentist today at 1:15 PM with Dr. Leanord Asal. Pt states in order for his visit to be covered, he needs to be seen in the ED and told to f/u with dentistry. He denies fevers, chills, or difficulty opening his mouth. He has a h/o a fib, svt, and diabetes. States his BGLs have been good over the past week.   HPI     Past Medical History:  Diagnosis Date  . A-fib (HCC)   . Diabetes mellitus without complication (HCC)   . History of hematuria   . Hypertension   . Prostatitis   . Reflux   . SVT (supraventricular tachycardia) [facility])     Patient Active Problem List   Diagnosis Date Noted  . Stridor 03/09/2018  . Hypoxemia 03/09/2018  . Edema 03/09/2018  . SVT (supraventricular tachycardia) (HCC) 04/09/2016    Past Surgical History:  Procedure Laterality Date  . ADENOIDECTOMY    . KNEE SURGERY    . TONSILLECTOMY    . URETER SURGERY    . WISDOM TOOTH EXTRACTION         Family History  Problem Relation Age of Onset  . Hypertension Mother   . Heart attack Father     Social History   Tobacco Use  . Smoking status: Current Every Day Smoker    Packs/day: 0.50    Years: 15.00    Pack years: 7.50    Types: Cigarettes  . Smokeless tobacco: Never Used  Substance Use Topics  . Alcohol use: No  . Drug use: No    Home Medications Prior to Admission medications   Medication Sig Start Date End Date Taking? Authorizing Provider  carvedilol (COREG) 6.25 MG tablet Take 1 tablet (6.25 mg total) by mouth 2 (two) times daily. 11/02/18   Camnitz, Will  Daphine Deutscher, MD  dexlansoprazole (DEXILANT) 60 MG capsule Take 60 mg by mouth daily. 05/12/17   [provider]  diltiazem (CARDIZEM CD) 360 MG 24 hr capsule Take 1 capsule (360 mg total) by mouth daily. 11/02/18   Camnitz, Andree Coss, MD  fenofibrate micronized (LOFIBRA) 134 MG capsule Take 134 mg by mouth daily before breakfast.    [provider]  flecainide (TAMBOCOR) 150 MG tablet Take 1 tablet (150 mg total) by mouth 2 (two) times daily. 11/02/18   Camnitz, Will Daphine Deutscher, MD  metFORMIN (GLUCOPHAGE) 500 MG tablet Take by mouth 2 (two) times daily with a meal.    [provider]  rosuvastatin (CRESTOR) 10 MG tablet TAKE 1 TABLET BY MOUTH EVERY DAY 02/07/19   Camnitz, Andree Coss, MD  Suvorexant 15 MG TABS Take by mouth.    [provider]  tamsulosin (FLOMAX) 0.4 MG CAPS capsule Take 0.4 mg by mouth daily.    [provider]    Allergies    Bactrim [sulfamethoxazole-trimethoprim], Clindamycin/lincomycin, Metoprolol, Penicillins, Pineapple, Toradol [ketorolac tromethamine], Tramadol, and Vicodin [hydrocodone-acetaminophen]  Review of Systems   Review of Systems  Constitutional: Negative for fever.  HENT: Positive for dental  problem.     Physical Exam Updated Vital Signs BP 106/69 (BP Location: Left Arm)   Pulse 65   Temp 97.9 F (36.6 C) (Oral)   Resp 16   Ht 5\' 10"  (1.778 m)   Wt 90.7 kg   SpO2 99%   BMI 28.70 kg/m   Physical Exam Vitals and nursing note reviewed.  Constitutional:      General: He is not in acute distress.    Appearance: He is well-developed.  HENT:     Head: Normocephalic and atraumatic.     Mouth/Throat:     Dentition: Abnormal dentition.      Comments: Tooth broken. TTP. No gum erythema or edema. No facial swelling. No trismus.  Multiple previous dental extractions. Multiple dental caries.  Pulmonary:     Effort: Pulmonary effort is normal.  Abdominal:     General: There is no distension.  Musculoskeletal:          General: Normal range of motion.     Cervical back: Normal range of motion.  Skin:    General: Skin is warm.     Findings: No rash.  Neurological:     Mental Status: He is alert and oriented to person, place, and time.     ED Results / Procedures / Treatments   Labs (all labs ordered are listed, but only abnormal results are displayed) Labs Reviewed - No data to display  EKG None  Radiology No results found.  Procedures Procedures (including critical care time)  Medications Ordered in ED Medications  lidocaine (XYLOCAINE) 2 % viscous mouth solution 15 mL (has no administration in time range)    ED Course  I have reviewed the triage vital signs and the nursing notes.  Pertinent labs & imaging results that were available during my care of the patient were reviewed by me and considered in my medical decision making (see chart for details).    MDM Rules/Calculators/A&P                      Pt presenting for evaluation of dental pain. Exam is not consistent with infection. Pt has f/u with dentistry today. Will give lidocaine in the ED and have him f/u with dentistry. At this time, pt appears safe for d/c. Return precautions given. Pt states he understands and agrees to plan.   Final Clinical Impression(s) / ED Diagnoses Final diagnoses:  Pain, dental    Rx / DC Orders ED Discharge Orders    None       Franchot Heidelberg, PA-C 09/25/19 1020    Veryl Speak, MD 09/25/19 1229

## 2019-09-25 NOTE — ED Triage Notes (Signed)
Pt has broken tooth and states that he needs a referral so his dentist will pay 100% of the bill.

## 2019-09-25 NOTE — Discharge Instructions (Signed)
Follow up at your scheduled appointment this afternoon with a dentist.  Return to the ER with any new, worsening, or concerning symptoms.

## 2019-11-30 ENCOUNTER — Other Ambulatory Visit: Payer: Self-pay | Admitting: Cardiology

## 2019-12-21 ENCOUNTER — Other Ambulatory Visit: Payer: Self-pay | Admitting: Cardiology

## 2019-12-25 ENCOUNTER — Ambulatory Visit: Payer: BC Managed Care – PPO | Admitting: Physician Assistant

## 2019-12-27 ENCOUNTER — Other Ambulatory Visit: Payer: Self-pay | Admitting: Cardiology

## 2020-01-15 NOTE — Progress Notes (Deleted)
Cardiology Office Note Date:  01/15/2020  Patient ID:  Terrance Hodges 11-11-1975, MRN [id number] PCP:  Karle Plumber, MD  EP:  Dr. Elberta Fortis  ***refresh   Chief Complaint: *** annual visit  History of Present Illness: Terrance Hodges is a 44 y.o. male with history of HTN, SVT (responds to valsalva), *** DM, AFib.  He comes in today to be seen for Dr. Elberta Fortis.  Last seen by him may 2020.  At that time doing well. Not on a/c with risk score of zero.  No changes were made.  12/09/2019 ER visit ([facility]) w/ right sided CP and hematuria for several days CP occurred after lifting something heavy and felt a pull. VSS K+ 4.3 BUN/Creat 18/1. WBC 9.8 H/H 12/37 Plts 251  Trop T, 5, 5  CXR ? L base density  Left AMA prior to w/u of hematuria completed    *** symptoms *** flecainide EKG, nodal blocker *** f/u PMD after ER? *** hematuria? recurrent *** no echo in epic or care everywhere, reportedly had at [facility] a couple years ago *** chads score, DM? HTN?    Past Medical History:  Diagnosis Date  . A-fib (HCC)   . Diabetes mellitus without complication (HCC)   . History of hematuria   . Hypertension   . Prostatitis   . Reflux   . SVT (supraventricular tachycardia) (HCC)     Past Surgical History:  Procedure Laterality Date  . ADENOIDECTOMY    . KNEE SURGERY    . TONSILLECTOMY    . URETER SURGERY    . WISDOM TOOTH EXTRACTION      Current Outpatient Medications  Medication Sig Dispense Refill  . carvedilol (COREG) 6.25 MG tablet TAKE 1 TABLET BY MOUTH TWICE A DAY 180 tablet 3  . dexlansoprazole (DEXILANT) 60 MG capsule Take 60 mg by mouth daily.    [name] diltiazem (CARDIZEM CD) 360 MG 24 hr capsule TAKE 1 CAPSULE BY MOUTH DAILY. PLEASE MAKE OVERDUE APPT WITH DR. Elberta Fortis BEFORE ANYMORE REFILLS. 30 capsule 11  . fenofibrate micronized (LOFIBRA) 134 MG capsule Take 134 mg by mouth daily before breakfast.    . flecainide (TAMBOCOR) 150 MG tablet TAKE 1 TABLET BY  MOUTH TWICE A DAY 180 tablet 3  . metFORMIN (GLUCOPHAGE) 500 MG tablet Take by mouth 2 (two) times daily with a meal.    . rosuvastatin (CRESTOR) 10 MG tablet TAKE 1 TABLET BY MOUTH EVERY DAY 90 tablet 2  . Suvorexant 15 MG TABS Take by mouth.    . tamsulosin (FLOMAX) 0.4 MG CAPS capsule Take 0.4 mg by mouth daily.     No current facility-administered medications for this visit.    Allergies:   Bactrim [sulfamethoxazole-trimethoprim], Clindamycin/lincomycin, Metoprolol, Penicillins, Pineapple, Toradol [ketorolac tromethamine], Tramadol, and Vicodin [hydrocodone-acetaminophen]   Social History:  The patient  reports that he has been smoking cigarettes. He has a 7.50 pack-year smoking history. He has never used smokeless tobacco. He reports that he does not drink alcohol and does not use drugs.   Family History:  The patient's family history includes Heart attack in his father; Hypertension in his mother.  ROS:  Please see the history of present illness. All other systems are reviewed and otherwise negative.   PHYSICAL EXAM: *** VS:  There were no vitals taken for this visit. BMI: There is no height or weight on file to calculate BMI. Well nourished, well developed, in no acute distress  HEENT: normocephalic, atraumatic  Neck:  no JVD, carotid bruits or masses Cardiac:  *** RRR; no significant murmurs, no rubs, or gallops Lungs:  *** CTA b/l, no wheezing, rhonchi or rales  Abd: soft, nontender MS: no deformity or *** atrophy Ext: *** no edema  Skin: warm and dry, no rash Neuro:  No gross deficits appreciated Psych: euthymic mood, full affect  EKG:  Done today and reviewed by myself shows ***  Recent Labs: No results found for requested labs within last 8760 hours.  No results found for requested labs within last 8760 hours.   CrCl cannot be calculated (Patient's most recent lab result is older than the maximum 21 days allowed.).   Wt Readings from Last 3 Encounters:  09/25/19 200  lb (90.7 kg)  06/11/19 210 lb (95.3 kg)  05/09/19 210 lb (95.3 kg)     Other studies reviewed: Additional studies/records reviewed today include: summarized above  ASSESSMENT AND PLAN:  1. SVT 2. Afib     CHA2DS2Vasc is 2     *** not on a/c     *** hx of hematuria, ? Recurrent  3. HTN    ***       Disposition: F/u with ***  Current medicines are reviewed at length with the patient today.  The patient did not have any concerns regarding medicines.***  Signed, Francis Dowse, PA-C 01/15/2020 6:09 AM     [facility] [address] [place] [address] [phone] (office)  [phone] (fax)

## 2020-01-16 ENCOUNTER — Ambulatory Visit: Payer: BLUE CROSS/BLUE SHIELD | Admitting: Physician Assistant

## 2020-02-12 NOTE — Progress Notes (Signed)
Cardiology Office Note Date:  02/14/2020  Patient ID:  Terrance Hodges, [place] [birth year], MRN [id number] PCP:  Karle Plumber, MD  EP:  Dr. Elberta Fortis     Chief Complaint:  annual visit  History of Present Illness: Terrance Hodges is a 44 y.o. male with history of HTN, SVT (responds to valsalva),  DM, AFib.  He comes in today to be seen for Dr. Elberta Fortis.  Last seen by him may 2020.  At that time doing well. Not on a/c with risk score of zero.  No changes were made.  12/09/2019 ER visit ([facility]) w/ right sided CP and hematuria for several days CP occurred after lifting something heavy and felt a pull. VSS K+ 4.3 BUN/Creat 18/1. WBC 9.8 H/H 12/37 Plts 251  Trop T, 5, 5  CXR ? L base density  Left AMA prior to w/u of hematuria completed  TODAY He is accompanied by his wife.  She often fills in his symptom story/HPI for him, mentions he needs help explaining himself, often has trouble with verbalizing his symptoms. He reports in the last month or 2, a new c/o CP and dizzy spells.  They can happen together or separately and are not necessarily linked. He locates his chest pain just inferior to his L breast, is is a pressure, can happen at any time, regardless of activity or position.  He has had it seated or even laying/resting, he has had hit while up and about with not particular change in behavior when ambulating. No associated symptoms, no N/V, he may feel a bit breathless with it. It can last a couple minutes to a 1/2 hour.  He doesn't do anything for it, it just goes away.  When he does have dizziness with it, he feels weak, his wife describes him as pale, and clammy.   The ER visit in July noted above he says was fr this CP, but denies a musculoskeletal component, no tenderness.  His dizziness is described as weakness, and near syncope.   He describes getting dizzy when standing up from a stopped or bent over position, but also has had feeling of "almost going out" that he has had  seated or when already up and about as well.  He starts to feel weak, looks around for his wife and asks her for help.  He seems to have warning, weakness, pallor, diaphoretic, then settles away This can happen with his above CP, and without. He has had a full syncopal event provked while in a sneezing spell, sneezing over and over and fainted.  This a couple weeks ago.  He does mention palpitations, though this is difficult, his wife often interrupting and correcting him.  He mentions that his heart beat can race, she says not fast but irregular. She also mentions that sometimes she finds his o2 sat 91-92% and thinks he may need oxygen, with a few months of occasional wheezing  He denies any changes in medicines, missed or skipped pills.     Past Medical History:  Diagnosis Date  . A-fib (HCC)   . Diabetes mellitus without complication (HCC)   . History of hematuria   . Hypertension   . Prostatitis   . Reflux   . SVT (supraventricular tachycardia) (HCC)     Past Surgical History:  Procedure Laterality Date  . ADENOIDECTOMY    . KNEE SURGERY    . TONSILLECTOMY    . URETER SURGERY    . WISDOM TOOTH EXTRACTION  Current Outpatient Medications  Medication Sig Dispense Refill  . carvedilol (COREG) 6.25 MG tablet TAKE 1 TABLET BY MOUTH TWICE A DAY 180 tablet 3  . dexlansoprazole (DEXILANT) 60 MG capsule Take 60 mg by mouth daily.    [name] diltiazem (CARDIZEM CD) 360 MG 24 hr capsule TAKE 1 CAPSULE BY MOUTH DAILY. PLEASE MAKE OVERDUE APPT WITH DR. Elberta Fortis BEFORE ANYMORE REFILLS. 30 capsule 11  . fenofibrate micronized (LOFIBRA) 134 MG capsule Take 134 mg by mouth daily before breakfast.    . flecainide (TAMBOCOR) 150 MG tablet TAKE 1 TABLET BY MOUTH TWICE A DAY 180 tablet 3  . oxyCODONE (OXY IR/ROXICODONE) 5 MG immediate release tablet Take 5 mg by mouth 4 (four) times daily.    . rosuvastatin (CRESTOR) 10 MG tablet TAKE 1 TABLET BY MOUTH EVERY DAY 90 tablet 2  . sitaGLIPtin-metformin  (JANUMET) 50-500 MG tablet Take 50-500 tablets by mouth 2 (two) times daily.    . Suvorexant 15 MG TABS Take by mouth.    . tamsulosin (FLOMAX) 0.4 MG CAPS capsule Take 0.4 mg by mouth daily.     No current facility-administered medications for this visit.    Allergies:   Bactrim [sulfamethoxazole-trimethoprim], Clindamycin/lincomycin, Metoprolol, Penicillins, Pineapple, Toradol [ketorolac tromethamine], Tramadol, Vicodin [hydrocodone-acetaminophen], Fentanyl, and Wellbutrin [bupropion]   Social History:  The patient  reports that he has been smoking cigarettes. He has a 7.50 pack-year smoking history. He has never used smokeless tobacco. He reports that he does not drink alcohol and does not use drugs.   Family History:  The patient's family history includes Heart attack in his father; Hypertension in his mother.  ROS:  Please see the history of present illness. All other systems are reviewed and otherwise negative.   PHYSICAL EXAM:  VS:  BP 110/60   Pulse 63   Ht 5\' 10"  (1.778 m)   Wt 220 lb (99.8 kg)   SpO2 96%   BMI 31.57 kg/m  BMI: Body mass index is 31.57 kg/m. Well nourished, well developed, in no acute distress  HEENT: normocephalic, atraumatic  Neck: no JVD, carotid bruits or masses Cardiac:  RRR; no significant murmurs, no rubs, or gallops Lungs:  CTA b/l, no wheezing, rhonchi or rales  Abd: soft, nontender MS: no deformity or atrophy Ext: no edema  Skin: warm and dry, no rash Neuro:  No gross deficits appreciated Psych: euthymic mood, full affect   EKG:  Done today and reviewed by myself shows  SR 63bpm, 1st degree AVlock PR , QRS (appears shorter by my measurement, is ), QT , QTc Intervals appear stable in comparison to last No ST/T changes   03/12/2016 TTE Conclusions  Summary  Atrial fibrillation noted.  Normal LV systolic function  Ejection fraction is visually estimated at 60-65%.  No LV regional wall motion abnormal.  LV  diastolic function is probably normal.  Normal left atrial size.  No significant valvular stenosis or regurgitation.   Recent Labs: No results found for requested labs within last 8760 hours.  No results found for requested labs within last 8760 hours.   CrCl cannot be calculated (Patient's most recent lab result is older than the maximum 21 days allowed.).   Wt Readings from Last 3 Encounters:  02/14/20 220 lb (99.8 kg)  09/25/19 200 lb (90.7 kg)  06/11/19 210 lb (95.3 kg)     Other studies reviewed: Additional studies/records reviewed today include: summarized above  ASSESSMENT AND PLAN:  1. SVT 2. Afib  CHA2DS2Vasc is 2     Historically has had a low risk score though has developed a diagnosis of HTN and DM, not on a/c      While the patient was here we discussed the need to start [facility] given his HTN and DM.  He denied any ongoing hematuria or bleeding and was agreeable.  Though left without me ordering it.  I have asked my MA to send in Eliquis 5mg  BID for him and to call and make him aware. Plan BMET and CBC when he comes for his echo July labs note Creat 1.8, his age/weight are appropriate for 5mg  dose  3. HTN     Looks good  4. CP      Fairly atypical in his description  5. Weak spells, near syncope and syncope   Wide constellation of symptoms, the story hard to follow, his CP sounds fairly atypical, his EKG unremarkable.  He states this CP is why he went to the ER in July, his Trop T 5, 5 this is reassuring.  He is having nearly daily symptoms  I have reviewed with DOD, recommend Coronary CT I will also pursue Zio AT and echo        Disposition: F/u with in 1 mo, sooner if needed    Current medicines are reviewed at length with the patient today.  The patient did not have any concerns regarding medicines.  August, PA-C 02/14/2020 1:14 PM     [facility] [address] [place] [phone] (office)    [phone] (fax)

## 2020-02-14 ENCOUNTER — Ambulatory Visit (INDEPENDENT_AMBULATORY_CARE_PROVIDER_SITE_OTHER): Payer: BLUE CROSS/BLUE SHIELD | Admitting: Physician Assistant

## 2020-02-14 ENCOUNTER — Telehealth: Payer: Self-pay | Admitting: Radiology

## 2020-02-14 ENCOUNTER — Telehealth: Payer: Self-pay | Admitting: *Deleted

## 2020-02-14 ENCOUNTER — Other Ambulatory Visit: Payer: Self-pay

## 2020-02-14 VITALS — BP 110/60 | HR 63 | Ht 70.0 in | Wt 220.0 lb

## 2020-02-14 DIAGNOSIS — I471 Supraventricular tachycardia: Secondary | ICD-10-CM | POA: Diagnosis not present

## 2020-02-14 DIAGNOSIS — R002 Palpitations: Secondary | ICD-10-CM

## 2020-02-14 DIAGNOSIS — R079 Chest pain, unspecified: Secondary | ICD-10-CM | POA: Diagnosis not present

## 2020-02-14 DIAGNOSIS — R55 Syncope and collapse: Secondary | ICD-10-CM

## 2020-02-14 MED ORDER — APIXABAN 5 MG PO TABS
5.0000 mg | ORAL_TABLET | Freq: Two times a day (BID) | ORAL | 6 refills | Status: DC
Start: 2020-02-14 — End: 2021-05-03

## 2020-02-14 MED ORDER — METOPROLOL TARTRATE 100 MG PO TABS
100.0000 mg | ORAL_TABLET | Freq: Once | ORAL | 0 refills | Status: DC
Start: 2020-02-14 — End: 2020-02-14

## 2020-02-14 NOTE — Patient Instructions (Addendum)
Medication Instructions:   MAKE SURE YOU TAKE CARVEDILOL 6.25 MG TWO HOURS PRIOR TO PROCEDURE ONCE SCHEDULED   ( corrected by pen on original office note from metoprolol)  Your physician recommends that you continue on your on all your other current medications as directed. Please refer to the Current Medication list given to you today.   *If you need a refill on your cardiac medications before your next appointment, please call your pharmacy*   Lab Work:  [place]   If you have labs (blood work) drawn today and your tests are completely normal, you will receive your results only by: [name] MyChart Message (if you have MyChart) OR . A paper copy in the mail If you have any lab test that is abnormal or we need to change your treatment, we will call you to review the results.   Testing/Procedures: Your physician has requested that you have an echocardiogram. Echocardiography is a painless test that uses sound waves to create images of your heart. It provides your doctor with information about the size and shape of your heart and how well your heart's chambers and valves are working. This procedure takes approximately one hour. There are no restrictions for this procedure.   Non-Cardiac CT Angiography (CTA), is a special type of CT scan that uses a computer to produce multi-dimensional views of major blood vessels throughout the body. In CT angiography, a contrast material is injected through an IV to help visualize the blood vessels SENT TO PRECERTIFICATION DEPARTMENT AND WILL CONTACT YOU BACK  FOR FURTHER STEPS.  Your physician has recommended that you wear an event monitor. Event monitors are medical devices that record the heart's electrical activity. Doctors most often [place] these monitors to diagnose arrhythmias. Arrhythmias are problems with the speed or rhythm of the heartbeat. The monitor is a small, portable device. You can wear one while you do your normal daily activities. This  is usually used to diagnose what is causing palpitations/syncope (passing out).   Follow-Up: At [facility], you and your health needs are our priority.  As part of our continuing mission to provide you with exceptional heart care, we have created designated Provider Care Teams.  These Care Teams include your primary Cardiologist (physician) and Advanced Practice Providers (APPs -  Physician Assistants and Nurse Practitioners) who all work together to provide you with the care you need, when you need it.  We recommend signing up for the patient portal called "MyChart".  Sign up information is provided on this After Visit Summary.  MyChart is used to connect with patients for Virtual Visits (Telemedicine).  Patients are able to view lab/test results, encounter notes, upcoming appointments, etc.  Non-urgent messages can be sent to your provider as well.   To learn more about what you can do with MyChart, go to [email or web address].    Your next appointment:   4 week(s)  The format for your next appointment:   In Person  Provider:    You may see Dr. Curt Bears   Other Instructions  ZIO AT Long term monitor-Live Telemetry  Your physician has requested you wear a ZIO patch monitor for _7_ days.  This is a single patch monitor. [employer] supplies one patch monitor per enrollment. Additional stickers are not available.  Please do not apply patch if you will be having a Nuclear Stress Test, Echocardiogram, Cardiac CT, MRI, or Chest Xray during the time frame you would be wearing the monitor. The patch cannot be worn  during these tests. You cannot remove and re-apply the ZIO AT patch monitor.   Your ZIO patch monitor will be sent Fed Ex from [employer] directly to your home address. The monitor may also be mailed to a PO BOX if home delivery is not available. It may take 3-5 days to receive your monitor after you have been enrolled.  Once you have received you monitor, please review  enclosed instructions. Your monitor has already been registered assigning a specific monitor serial # to you.   Applying the monitor  Shave hair from upper left chest.  Hold abrader disc by orange tab. Rub abrader in 40 strokes over left upper chest as indicated in your monitor instructions.  Clean area with 4 enclosed alcohol pads. Use all pads to ensure the area is cleaned thoroughly. Let dry.  Apply patch as indicated in monitor instructions. Patch will be placed under collarbone on left side of chest with arrow pointing upward.  Rub patch adhesive wings for 2 minutes. Remove the white label marked "1". Remove the white label marked "2". Rub patch adhesive wings for 2 additional minutes.  While looking in a mirror, press and release button in center of patch. A small green light will flash 3-4 times. This will be your only indicator the monitor has been turned on.  Do not shower for the first 24 hours. You may shower after the first 24 hours.  Press the button if you feel a symptom. You will hear a small click. Record Date, Time and Symptom in the Patient Log.   Starting the Gateway  In your kit there is a Hydrographic surveyor box the size of a cellphone. This is Airline pilot. It transmits all your recorded data to [facility]. This box must stay within 10 feet of you at all times. Open the box and push the * button. There will be a light that blinks orange and then green a few times. When the light stops blinking, the Gateway is connected to the ZIO patch.  Call [employer] at [phone] to confirm your monitor is transmitting.   Returning your monitor  Remove your patch and place it inside the [place]. In the lower half of the Gateway there is a white bag with prepaid postage on it. Place Gateway in bag and seal. Mail package back to [place] as soon as possible. Your physician should have your final report approximately 7 days after you have mailed back your monitor.   Call [place] at [phone] if you have questions regarding your ZIO AT patch monitor. Call them immediately if you see an orange light blinking on your monitor.  If your monitor falls off in less than 4 days contact our Monitor department at [phone]. If your monitor becomes loose or falls off after 4 days call [employer] at [phone] for suggestions on securing your monitor.     Your cardiac CT will be scheduled at one of the below locations:   [facility] [address] [place], [place] [address] [phone]  [place] [address] [place], [place] [address] [phone]  If scheduled at [facility], please arrive at the [facility] main entrance of [facility] 30 minutes prior to test start time. Proceed to the [facility] Radiology Department (first floor) to check-in and test prep.  If scheduled at [facility], please arrive 15 mins early for check-in and test prep.  Please follow these instructions carefully (unless otherwise directed):  Hold all erectile dysfunction medications at least 3 days (72 hrs) prior to test.  On the Night Before the Test: . Be sure to Drink plenty of water. . Do not consume any caffeinated/decaffeinated beverages or chocolate 12 hours prior to your test. . Do not take any antihistamines 12 hours prior to your test. . If the patient has contrast allergy: ? Patient will need a prescription for Prednisone and very clear instructions (as follows): 1. Prednisone 50 mg - take 13 hours prior to test 2. Take another Prednisone 50 mg 7 hours prior to test 3. Take another Prednisone 50 mg 1 hour prior to test 4. Take Benadryl 50 mg 1 hour prior to test . Patient must complete all four doses of above prophylactic medications. . Patient will need a ride after test due to Benadryl.  On the Day of the Test: . Drink plenty of water. Do not drink  any water within one hour of the test. . Do not eat any food 4 hours prior to the test. . You may take your regular medications prior to the test.  . Take metoprolol (Lopressor) two hours prior to test. . HOLD Furosemide/Hydrochlorothiazide morning of the test. . FEMALES- please wear underwire-free bra if available   *For Clinical Staff only. Please instruct patient the following:*        -Drink plenty of water       -Hold Furosemide/hydrochlorothiazide morning of the test       -Take metoprolol (Lopressor) 2 hours prior to test (if applicable).                  -If HR is less than 55 BPM- No Lopressor                -IF HR is greater than 55 BPM and patient is less than or equal to 40 yrs old Lopressor $RemoveBefor'100mg'rHOnFrQWWcyR$  x1.                -If HR is greater than 55 BPM and patient is greater than 44 yrs old Lopressor 50 mg x1.     Do not give Lopressor to patients with an allergy to lopressor or anyone with asthma or active COPD symptoms (currently taking steroids).       After the Test: . Drink plenty of water. . After receiving IV contrast, you may experience a mild flushed feeling. This is normal. . On occasion, you may experience a mild rash up to 24 hours after the test. This is not dangerous. If this occurs, you can take Benadryl 25 mg and increase your fluid intake. . If you experience trouble breathing, this can be serious. If it is severe call 911 IMMEDIATELY. If it is mild, please call our office. . If you take any of these medications: Glipizide/Metformin, Avandament, Glucavance, please do not take 48 hours after completing test unless otherwise instructed.   Your cardiac CT will be scheduled at one of the below locations:   [facility] [address] [place], [place] [address] [phone]  [place] [address] [place], [place] [address] [phone]  If scheduled at [facility], please arrive at  the [facility] main entrance of [facility] 30 minutes prior to test start time. Proceed to the [facility] Radiology Department (first floor) to check-in and test prep.  If scheduled at [facility], please arrive 15 mins early for check-in and test prep.  Please follow these instructions carefully (unless otherwise directed):  Hold all erectile dysfunction medications at least 3 days (72 hrs) prior to test.  On the Night Before the Test: . Be sure to Drink plenty of water. . Do not consume any caffeinated/decaffeinated beverages or chocolate 12 hours prior to your test. . Do not take any antihistamines 12 hours prior to your test.  On the Day of the Test: . Drink plenty of water. Do not drink any water within one hour of the test. . Do not eat any food 4 hours prior to the test. . You may take your regular medications prior to the test.  . Take metoprolol (Lopressor) two hours prior to test. . HOLD Furosemide/Hydrochlorothiazide morning of the test.  *For Clinical Staff only. Please instruct patient the following:*        -Drink plenty of water       -Hold Furosemide/hydrochlorothiazide morning of the test       -Take metoprolol (Lopressor) 2 hours prior to test (if applicable).                  -If HR is less than 55 BPM- No Lopressor                -IF HR is greater than 55 BPM and patient is less than or equal to 25 yrs old Lopressor 189m x1.                -If HR is greater than 55 BPM and patient is greater than 762yrs old Lopressor 50 mg x1.     Do not give Lopressor to patients with an allergy to lopressor or anyone with asthma or active COPD symptoms (currently taking steroids).       After the Test: . Drink plenty of water. . After receiving IV contrast, you may experience a mild flushed feeling. This is normal. . On occasion, you may experience a mild rash up to 24 hours after the test. This is not dangerous. If this occurs, you can  take Benadryl 25 mg and increase your fluid intake. . If you experience trouble breathing, this can be serious. If it is severe call 911 IMMEDIATELY. If it is mild, please call our office. . If you take any of these medications: Glipizide/Metformin, Avandament, Glucavance, please do not take 48 hours after completing test unless otherwise instructed.   Once we have confirmed authorization from your insurance company, we will call you to set up a date and time for your test. Based on how quickly your insurance processes prior authorizations requests, please allow up to 4 weeks to be contacted for scheduling your Cardiac CT appointment. Be advised that routine Cardiac CT appointments could be scheduled as many as 8 weeks after your provider has ordered it.  For non-scheduling related questions, please contact the cardiac imaging nurse navigator should you have any questions/concerns: SMarchia Bond Cardiac Imaging Nurse Navigator MBurley Saver Interim Cardiac Imaging Nurse [facility] Direct Office Dial: [phone]  For scheduling needs, including cancellations and rescheduling, please call TVivien Rotaat [phone] option 3.   For scheduling needs, including cancellations and rescheduling, please call TVivien Rotaat [phone] option 3.

## 2020-02-14 NOTE — Telephone Encounter (Signed)
Spoke with patient about new start Eliquis 5 mg as discussed with Francis Dowse at visit sent into Rx  pharmacyand labs CBC and BMET  to be drawn on 03-03-20  Same day as echo

## 2020-02-14 NOTE — Telephone Encounter (Signed)
Enrolled patient for a 14 day [employer] Event Monitor to be mailed to patients home.  

## 2020-02-14 NOTE — Addendum Note (Signed)
Addended by: Oleta Mouse on: 02/14/2020 02:28 PM   Modules accepted: Orders

## 2020-02-24 ENCOUNTER — Ambulatory Visit (INDEPENDENT_AMBULATORY_CARE_PROVIDER_SITE_OTHER): Payer: BLUE CROSS/BLUE SHIELD

## 2020-02-24 DIAGNOSIS — R55 Syncope and collapse: Secondary | ICD-10-CM

## 2020-03-03 ENCOUNTER — Other Ambulatory Visit: Payer: BLUE CROSS/BLUE SHIELD

## 2020-03-03 ENCOUNTER — Encounter (HOSPITAL_COMMUNITY): Payer: Self-pay | Admitting: Physician Assistant

## 2020-03-03 ENCOUNTER — Other Ambulatory Visit (HOSPITAL_COMMUNITY): Payer: BLUE CROSS/BLUE SHIELD

## 2020-03-04 ENCOUNTER — Other Ambulatory Visit: Payer: BLUE CROSS/BLUE SHIELD | Admitting: *Deleted

## 2020-03-04 ENCOUNTER — Other Ambulatory Visit: Payer: Self-pay

## 2020-03-04 DIAGNOSIS — I471 Supraventricular tachycardia: Secondary | ICD-10-CM

## 2020-03-04 LAB — BASIC METABOLIC PANEL
BUN/Creatinine Ratio: 8 — ABNORMAL LOW (ref 9–20)
BUN: 10 mg/dL (ref 6–24)
CO2: 25 mmol/L (ref 20–29)
Calcium: 9.2 mg/dL (ref 8.7–10.2)
Chloride: 96 mmol/L (ref 96–106)
Creatinine, Ser: 1.27 mg/dL (ref 0.76–1.27)
GFR calc Af Amer: 80 mL/min/{1.73_m2} (ref >59)
GFR calc non Af Amer: 69 mL/min/{1.73_m2} (ref >59)
Glucose: 277 mg/dL — ABNORMAL HIGH (ref 65–99)
Potassium: 3.8 mmol/L (ref 3.5–5.2)
Sodium: 137 mmol/L (ref 134–144)

## 2020-03-04 LAB — CBC
Hematocrit: 37.1 % — ABNORMAL LOW (ref 37.5–51.0)
Hemoglobin: 12.7 g/dL — ABNORMAL LOW (ref 13.0–17.7)
MCH: 32.1 pg (ref 26.6–33.0)
MCHC: 34.2 g/dL (ref 31.5–35.7)
MCV: 94 fL (ref 79–97)
Platelets: 248 10*3/uL (ref 150–450)
RBC: 3.96 x10E6/uL — ABNORMAL LOW (ref 4.14–5.80)
RDW: 12.2 % (ref 11.6–15.4)
WBC: 15.1 10*3/uL — ABNORMAL HIGH (ref 3.4–10.8)

## 2020-03-10 ENCOUNTER — Ambulatory Visit: Payer: BLUE CROSS/BLUE SHIELD | Admitting: Cardiology

## 2020-03-10 ENCOUNTER — Telehealth: Payer: Self-pay | Admitting: *Deleted

## 2020-03-10 NOTE — Telephone Encounter (Signed)
Attemped to call patient with lab results and recommendation per Terrance Hodges but his phone number is not working. I sent him a mychart message to try and communicate that way.

## 2020-03-12 ENCOUNTER — Other Ambulatory Visit: Payer: Self-pay | Admitting: Physician Assistant

## 2020-03-12 DIAGNOSIS — R55 Syncope and collapse: Secondary | ICD-10-CM

## 2020-03-12 NOTE — Telephone Encounter (Signed)
Spoke to the patients wife about lab work and recommendations.   She verbalized understanding

## 2020-03-13 ENCOUNTER — Telehealth (HOSPITAL_COMMUNITY): Payer: Self-pay | Admitting: Physician Assistant

## 2020-03-13 NOTE — Telephone Encounter (Signed)
Just an FYI. We have made several attempts to contact this patient including sending a letter to schedule or reschedule their echocardiogram. We will be removing the patient from the echo WQ.   03/03/20 PT NO SHOWED -MAILED LETTER LBW    Thank you

## 2020-03-18 ENCOUNTER — Telehealth (HOSPITAL_COMMUNITY): Payer: Self-pay | Admitting: Emergency Medicine

## 2020-03-18 NOTE — Telephone Encounter (Signed)
Reaching out to patient to offer assistance regarding upcoming cardiac imaging study; pt verbalizes understanding of appt date/time, parking situation and where to check in, pre-test NPO status and medications ordered, and verified current allergies; name and call back number provided for further questions should they arise Rockwell Alexandria RN Navigator Cardiac Imaging [facility] [phone] office [phone] cell  Spoke with wife of patient who had questions about medications prior to scan. I asked to postpone patient taking AM dose of carvedilol and to take it 2 hr prior to scan.  Wife verbalized understanding.  [place] Dec

## 2020-03-19 ENCOUNTER — Other Ambulatory Visit: Payer: Self-pay

## 2020-03-19 ENCOUNTER — Ambulatory Visit (HOSPITAL_COMMUNITY)
Admission: RE | Admit: 2020-03-19 | Discharge: 2020-03-19 | Disposition: A | Discharge status: Home / Self Care | Payer: BLUE CROSS/BLUE SHIELD | Source: Ambulatory Visit | Attending: Physician Assistant | Admitting: Physician Assistant

## 2020-03-19 DIAGNOSIS — R079 Chest pain, unspecified: Secondary | ICD-10-CM | POA: Insufficient documentation

## 2020-03-19 MED ORDER — IOHEXOL 350 MG/ML SOLN
80.0000 mL | Freq: Once | INTRAVENOUS | Status: AC | PRN
  Administered 2020-03-19: 80 mL via INTRAVENOUS

## 2020-03-19 MED ORDER — NITROGLYCERIN 0.4 MG SL SUBL
0.8000 mg | SUBLINGUAL_TABLET | Freq: Once | SUBLINGUAL | Status: AC
  Administered 2020-03-19: 0.8 mg via SUBLINGUAL

## 2020-03-19 MED ORDER — NITROGLYCERIN 0.4 MG SL SUBL
SUBLINGUAL_TABLET | SUBLINGUAL | Status: AC
  Filled 2020-03-19: qty 2

## 2020-03-20 DIAGNOSIS — R079 Chest pain, unspecified: Secondary | ICD-10-CM | POA: Diagnosis not present

## 2020-03-23 ENCOUNTER — Other Ambulatory Visit: Payer: Self-pay | Admitting: *Deleted

## 2020-03-23 DIAGNOSIS — Z79899 Other long term (current) drug therapy: Secondary | ICD-10-CM

## 2020-03-23 MED ORDER — ROSUVASTATIN CALCIUM 10 MG PO TABS
20.0000 mg | ORAL_TABLET | Freq: Every day | ORAL | 1 refills | Status: DC
Start: 2020-03-23 — End: 2021-05-03

## 2020-04-23 ENCOUNTER — Ambulatory Visit: Payer: BLUE CROSS/BLUE SHIELD | Admitting: Cardiology

## 2020-10-27 ENCOUNTER — Ambulatory Visit: Payer: Self-pay | Admitting: Cardiology

## 2020-12-04 ENCOUNTER — Other Ambulatory Visit: Payer: Self-pay | Admitting: Cardiology

## 2021-01-04 ENCOUNTER — Other Ambulatory Visit: Payer: Self-pay | Admitting: Cardiology

## 2021-01-05 ENCOUNTER — Other Ambulatory Visit: Payer: Self-pay | Admitting: Physician Assistant

## 2021-03-08 ENCOUNTER — Ambulatory Visit: Payer: 59 | Admitting: Cardiology

## 2021-04-08 ENCOUNTER — Other Ambulatory Visit: Payer: Self-pay | Admitting: Physician Assistant

## 2021-04-12 ENCOUNTER — Other Ambulatory Visit: Payer: Self-pay | Admitting: Cardiology

## 2021-05-03 ENCOUNTER — Other Ambulatory Visit: Payer: Self-pay

## 2021-05-03 ENCOUNTER — Encounter: Payer: Self-pay | Admitting: Cardiology

## 2021-05-03 ENCOUNTER — Ambulatory Visit (INDEPENDENT_AMBULATORY_CARE_PROVIDER_SITE_OTHER): Payer: 59 | Admitting: Cardiology

## 2021-05-03 VITALS — BP 104/68 | HR 64 | Ht 70.0 in | Wt 215.0 lb

## 2021-05-03 DIAGNOSIS — I471 Supraventricular tachycardia: Secondary | ICD-10-CM | POA: Diagnosis not present

## 2021-05-03 NOTE — Progress Notes (Signed)
Electrophysiology Office Note   Date:  05/03/2021   ID:  Terrance Hodges, DOB August 10, 1975, MRN [id number]  PCP:  Guadlupe Spanish, MD  Primary Electrophysiologist:  Constance Haw, MD    No chief complaint on file.    History of Present Illness: Terrance Hodges is a 45 y.o. male who is being seen today for the evaluation of SVT and atrial fibrillation at the request of Arvind, Aldean Baker, MD. Presenting today for electrophysiology evaluation.    He was seen in the emergency room 04/09/2016 for episodes of SVT and heart rates of around 200 bpm.  SVT responded to Valsalva and converted to sinus rhythm.  He also has a history of atrial fibrillation.  He presented to [facility] with palpitations.  He is currently on both flecainide and metoprolol.  Today, denies symptoms of palpitations, chest pain, shortness of breath, orthopnea, PND, lower extremity edema, claudication, dizziness, presyncope, syncope, bleeding, or neurologic sequela. The patient is tolerating medications without difficulties.  Since being seen he has done well.  He has had no chest pain or shortness of breath.  He is able to all of his daily activities without restriction.  He is noted no further episodes of tachypalpitations.  He is overall happy with his control.  He has not had any issues with his flecainide.   Past Medical History:  Diagnosis Date   A-fib ([place])    Diabetes mellitus without complication ([place])    History of hematuria    Hypertension    Prostatitis    Reflux    SVT (supraventricular tachycardia) ([facility])    Past Surgical History:  Procedure Laterality Date   ADENOIDECTOMY     KNEE SURGERY     TONSILLECTOMY     URETER SURGERY     WISDOM TOOTH EXTRACTION       Current Outpatient Medications  Medication Sig Dispense Refill   Buprenorphine HCl-Naloxone HCl 8-2 MG FILM Place 1 Film under the tongue in the morning and at bedtime.     carvedilol (COREG) 6.25 MG tablet TAKE 1 TABLET BY MOUTH  TWICE A DAY 180 tablet 3   dexlansoprazole (DEXILANT) 60 MG capsule Take 60 mg by mouth daily.     diltiazem (CARDIZEM CD) 360 MG 24 hr capsule TAKE 1 CAPSULE BY MOUTH DAILY 90 capsule 0   fenofibrate micronized (LOFIBRA) 134 MG capsule Take 134 mg by mouth daily before breakfast.     flecainide (TAMBOCOR) 150 MG tablet TAKE 1 TABLET BY MOUTH 2 TIMES A DAY 180 tablet 0   rosuvastatin (CRESTOR) 20 MG tablet Take 20 mg by mouth daily.     sitaGLIPtin-metformin (JANUMET) 50-500 MG tablet Take 50-500 tablets by mouth 2 (two) times daily.     tamsulosin (FLOMAX) 0.4 MG CAPS capsule Take 0.4 mg by mouth daily.     No current facility-administered medications for this visit.    Allergies:   Bactrim [sulfamethoxazole-trimethoprim], Clindamycin/lincomycin, Metoprolol, Penicillins, Pineapple, Toradol [ketorolac tromethamine], Tramadol, Vicodin [hydrocodone-acetaminophen], Fentanyl, and Wellbutrin [bupropion]   Social History:  The patient  reports that he has been smoking cigarettes. He has a 7.50 pack-year smoking history. He has never used smokeless tobacco. He reports that he does not drink alcohol and does not use drugs.   Family History:  The patient's family history includes Heart attack in his father; Hypertension in his mother.   ROS:  Please see the history of present illness.   Otherwise, review of systems is positive for  none.   All other systems are reviewed and negative.   PHYSICAL EXAM: VS:  BP 104/68   Pulse 64   Ht 5\' 10"  (1.778 m)   Wt 215 lb (97.5 kg)   SpO2 95%   BMI 30.85 kg/m  , BMI Body mass index is 30.85 kg/m. GEN: Well nourished, well developed, in no acute distress  HEENT: normal  Neck: no JVD, carotid bruits, or masses Cardiac: RRR; no murmurs, rubs, or gallops,no edema  Respiratory:  clear to auscultation bilaterally, normal work of breathing GI: soft, nontender, nondistended, + BS MS: no deformity or atrophy  Skin: warm and dry Neuro:  Strength and sensation  are intact Psych: euthymic mood, full affect  EKG:  EKG is ordered today. Personal review of the ekg ordered shows sinus rhythm, first-degree AV block, rate 64  Recent Labs: No results found for requested labs within last 8760 hours.    Lipid Panel  No results found for: CHOL, TRIG, HDL, CHOLHDL, VLDL, LDLCALC, LDLDIRECT   Wt Readings from Last 3 Encounters:  05/03/21 215 lb (97.5 kg)  02/14/20 220 lb (99.8 kg)  09/25/19 200 lb (90.7 kg)      Other studies Reviewed: Additional studies/ records that were reviewed today include: Primary physician outside records  ASSESSMENT AND PLAN:  1.  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  Is currently on flecainide and diltiazem with improved symptoms.  Has not had recurrence.  Continue medical management at this time.  2.  Atrial fibrillation.  Currently on flecainide and diltiazem.  High risk medication monitoring via ECG.  CHA2DS2-VASc of 0 and thus not anticoagulated.  No further recurrences.  Current medicines are reviewed at length with the patient today.   The patient does not have concerns regarding his medicines.  The following changes were made today: None  Labs/ tests ordered today include:  Orders Placed This Encounter  Procedures   EKG 12-Lead      Disposition:   FU with Carmeron Heady 12 months  Signed, Vipul Cafarelli 13/09/2016, MD  05/03/2021 3:50 PM     [facility] [address] [place] [phone] (office) [phone] (fax)

## 2021-07-06 ENCOUNTER — Other Ambulatory Visit: Payer: Self-pay | Admitting: Physician Assistant

## 2021-10-14 ENCOUNTER — Other Ambulatory Visit: Payer: Self-pay

## 2021-10-14 ENCOUNTER — Encounter (HOSPITAL_BASED_OUTPATIENT_CLINIC_OR_DEPARTMENT_OTHER): Payer: Self-pay | Admitting: Urology

## 2021-10-14 ENCOUNTER — Emergency Department (HOSPITAL_BASED_OUTPATIENT_CLINIC_OR_DEPARTMENT_OTHER): Payer: BC Managed Care – PPO

## 2021-10-14 ENCOUNTER — Emergency Department (HOSPITAL_BASED_OUTPATIENT_CLINIC_OR_DEPARTMENT_OTHER)
Admission: EM | Admit: 2021-10-14 | Discharge: 2021-10-14 | Disposition: A | Discharge status: Home / Self Care | Payer: BC Managed Care – PPO | Attending: Emergency Medicine | Admitting: Emergency Medicine

## 2021-10-14 DIAGNOSIS — Z79899 Other long term (current) drug therapy: Secondary | ICD-10-CM | POA: Diagnosis not present

## 2021-10-14 DIAGNOSIS — L739 Follicular disorder, unspecified: Secondary | ICD-10-CM | POA: Insufficient documentation

## 2021-10-14 DIAGNOSIS — R590 Localized enlarged lymph nodes: Secondary | ICD-10-CM | POA: Insufficient documentation

## 2021-10-14 DIAGNOSIS — Z7984 Long term (current) use of oral hypoglycemic drugs: Secondary | ICD-10-CM | POA: Diagnosis not present

## 2021-10-14 DIAGNOSIS — L03221 Cellulitis of neck: Secondary | ICD-10-CM

## 2021-10-14 DIAGNOSIS — L539 Erythematous condition, unspecified: Secondary | ICD-10-CM | POA: Diagnosis present

## 2021-10-14 DIAGNOSIS — R1110 Vomiting, unspecified: Secondary | ICD-10-CM | POA: Insufficient documentation

## 2021-10-14 LAB — BASIC METABOLIC PANEL
Anion gap: 8 (ref 5–15)
BUN: 18 mg/dL (ref 6–20)
CO2: 25 mmol/L (ref 22–32)
Calcium: 9.2 mg/dL (ref 8.9–10.3)
Chloride: 105 mmol/L (ref 98–111)
Creatinine, Ser: 1.62 mg/dL — ABNORMAL HIGH (ref 0.61–1.24)
GFR, Estimated: 54 mL/min — ABNORMAL LOW (ref >60)
Glucose, Bld: 102 mg/dL — ABNORMAL HIGH (ref 70–99)
Potassium: 4 mmol/L (ref 3.5–5.1)
Sodium: 138 mmol/L (ref 135–145)

## 2021-10-14 LAB — CBC
HCT: 44.1 % (ref 39.0–52.0)
Hemoglobin: 15.7 g/dL (ref 13.0–17.0)
MCH: 32.2 pg (ref 26.0–34.0)
MCHC: 35.6 g/dL (ref 30.0–36.0)
MCV: 90.6 fL (ref 80.0–100.0)
Platelets: 262 10*3/uL (ref 150–400)
RBC: 4.87 MIL/uL (ref 4.22–5.81)
RDW: 12.3 % (ref 11.5–15.5)
WBC: 6.5 10*3/uL (ref 4.0–10.5)
nRBC: 0 % (ref 0.0–0.2)

## 2021-10-14 MED ORDER — IOHEXOL 300 MG/ML  SOLN
80.0000 mL | Freq: Once | INTRAMUSCULAR | Status: AC | PRN
  Administered 2021-10-14: 80 mL via INTRAVENOUS

## 2021-10-14 MED ORDER — DOXYCYCLINE HYCLATE 100 MG PO CAPS: 100.0000 mg | ORAL_CAPSULE | Freq: Two times a day (BID) | ORAL | 0 refills | Status: DC

## 2021-10-14 NOTE — Discharge Instructions (Addendum)
It was a pleasure taking care of you today.  The swelling on the back of your neck appears to be a lymph node.  This could be response to folliculitis which was not responsive to the Bactrim that you are previously taking.  Will prescribe doxycycline twice daily for 7 days.  Recommend follow-up with your primary care if symptoms do not improve. ?

## 2021-10-14 NOTE — ED Provider Notes (Signed)
?[facility] EMERGENCY DEPARTMENT ?Provider Note ? ? ?CSN: [id number] ?Arrival date & time: 10/14/21  1257 ? ?  ? ?History ? ?Chief Complaint  ?Patient presents with  ? Abscess  ? ? ?Terrance Hodges is a 46 y.o. male. ? ?Patient presents with 10-day history of erythema and burning on the back of his neck which goes up into his occipital area and then down into his neck and shoulder.  Was seen by PCP right after it started and was diagnosed with an abscess and treated with 10 days of Bactrim.  Reports that he has completed the 10 days of Bactrim and has had no improvement in the symptoms.  Denies any fever or chills.  Reports that he has had an infected hair follicle in the past but never experienced anything like this.  Reports 1 episode of vomiting which she feels is most likely related to the Bactrim he was taking.  The nausea has resolved and not returned.  Patient denies any other symptoms. ? ? ?  ? ?Home Medications ?Prior to Admission medications   ?Medication Sig Start Date End Date Taking? Authorizing Provider  ?doxycycline (VIBRAMYCIN) 100 MG capsule Take 1 capsule (100 mg total) by mouth 2 (two) times daily. 10/14/21  Yes Derrel Nip, MD  ?Buprenorphine HCl-Naloxone HCl 8-2 MG FILM Place 1 Film under the tongue in the morning and at bedtime.    [provider]  ?carvedilol (COREG) 6.25 MG tablet TAKE 1 TABLET BY MOUTH TWICE A DAY 12/24/19   Sheilah Pigeon, PA-C  ?dexlansoprazole (DEXILANT) 60 MG capsule Take 60 mg by mouth daily. 05/12/17   [provider]  ?diltiazem (CARDIZEM CD) 360 MG 24 hr capsule TAKE 1 CAPSULE BY MOUTH DAILY 04/12/21   Camnitz, Andree Coss, MD  ?fenofibrate micronized (LOFIBRA) 134 MG capsule Take 134 mg by mouth daily before breakfast.    [provider]  ?flecainide (TAMBOCOR) 150 MG tablet TAKE 1 TABLET BY MOUTH 2 TIMES A DAY 07/06/21   Camnitz, Andree Coss, MD  ?rosuvastatin (CRESTOR) 20 MG tablet Take 20 mg by mouth daily. 04/01/21   [provider]  ?sitaGLIPtin-metformin (JANUMET) 50-500 MG tablet Take 50-500 tablets by mouth 2 (two) times daily. 01/31/20   [provider]  ?tamsulosin (FLOMAX) 0.4 MG CAPS capsule Take 0.4 mg by mouth daily.    [provider]  ?   ? ?Allergies    ?Bactrim [sulfamethoxazole-trimethoprim], Clindamycin/lincomycin, Metoprolol, Penicillins, Pineapple, Toradol [ketorolac tromethamine], Tramadol, Vicodin [hydrocodone-acetaminophen], Fentanyl, and Wellbutrin [bupropion]   ? ?Review of Systems   ?Review of Systems  ?Constitutional:  Negative for chills and fever.  ?HENT:  Negative for sore throat.   ?Eyes:  Negative for visual disturbance.  ?Respiratory:  Negative for cough and shortness of breath.   ?Cardiovascular:  Negative for chest pain and palpitations.  ?Gastrointestinal:  Positive for vomiting. Negative for abdominal pain.  ?Genitourinary:  Negative for dysuria.  ?Musculoskeletal:  Positive for neck pain. Negative for arthralgias and back pain.  ?Skin:  Positive for rash. Negative for color change.  ?Neurological:  Negative for seizures and syncope.  ?All other systems reviewed and are negative. ? ?Physical Exam ?Updated Vital Signs ?BP 125/84   Pulse 67   Temp 98.1 ?F (36.7 ?C) (Oral)   Resp 18   Ht 5\' 10"  (1.778 m)   Wt 97.5 kg   SpO2 97%   BMI 30.85 kg/m?  ?Physical Exam ?Vitals and nursing note reviewed.  ?Constitutional:   ?  General: He is not in acute distress. ?   Appearance: He is well-developed.  ?HENT:  ?   Head: Normocephalic and atraumatic.  ?   Nose: No congestion or rhinorrhea.  ?Eyes:  ?   Conjunctiva/sclera: Conjunctivae normal.  ?Cardiovascular:  ?   Rate and Rhythm: Normal rate and regular rhythm.  ?   Heart sounds: No murmur heard. ?Pulmonary:  ?   Effort: Pulmonary effort is normal. No respiratory distress.  ?   Breath sounds: Normal breath sounds.  ?Abdominal:  ?   Palpations: Abdomen is soft.  ?   Tenderness: There is no abdominal tenderness.  ?Musculoskeletal:      ?   General: No swelling.  ?   Cervical back: Neck supple. Tenderness present. No rigidity.  ?Lymphadenopathy:  ?   Cervical: Cervical adenopathy (Entire posterior cervical chain is enlarged and erythematous.) present.  ?Skin: ?   General: Skin is warm and dry.  ?   Capillary Refill: Capillary refill takes less than 2 seconds.  ?Neurological:  ?   Mental Status: He is alert.  ?Psychiatric:     ?   Mood and Affect: Mood normal.  ? ?Bedside ultrasound of posterior cervical chain performed with attending Dr. Leo Rod showing no fluid pocket more consistent with swollen lymph node. ?ED Results / Procedures / Treatments   ?Labs ?(all labs ordered are listed, but only abnormal results are displayed) ?Labs Reviewed  ?BASIC METABOLIC PANEL - Abnormal; Notable for the following components:  ?    Result Value  ? Glucose, Bld 102 (*)   ? Creatinine, Ser 1.62 (*)   ? GFR, Estimated 54 (*)   ? All other components within normal limits  ?CBC  ? ? ?EKG ?None ? ?Radiology ?No results found. ? ?Procedures ?Procedures  ? ? ?Medications Ordered in ED ?Medications - No data to display ? ?ED Course/ Medical Decision Making/ A&P ?  ?                        ?Medical Decision Making ?Patient presenting with concern for abscess on the back of his neck.  Vital signs within normal limits on arrival.  Patient has completed 10-day course of Bactrim without any improvement.  Physical exam significant for swelling of posterior cervical lymph nodes on the left side.  There is also erythema present.  Ultrasound showing no fluid pockets.  CT soft tissue neck ordered.  This could be folliculitis not responsive to Bactrim.  Pending the results from the CT soft tissue neck patient will most likely be able to be discharged on doxycycline 100 mg twice daily for 7 days. ? ?Handoff performed to oncoming provider who will follow-up and likely discharge patient. ? ?Amount and/or Complexity of Data Reviewed ?Labs: ordered. ? ? ?Final Clinical Impression(s) /  ED Diagnoses ?Final diagnoses:  ?Folliculitis  ? ? ?Rx / DC Orders ?ED Discharge Orders   ? ?      Ordered  ?  doxycycline (VIBRAMYCIN) 100 MG capsule  2 times daily       ? 10/14/21 1453  ? ?  ?  ? ?  ? ? ?  ?Derrel Nip, MD ?10/14/21 1453 ? ?  ?Jacalyn Lefevre, MD ?10/15/21 [phone] ? ?

## 2021-10-14 NOTE — ED Triage Notes (Signed)
Abscess to posterior neck x 2.5 weeks ?Finished a course of Bactrim Monday with no improvement ?States pain worsening and now more swollen  ? ?

## 2021-10-18 ENCOUNTER — Ambulatory Visit: Payer: Self-pay | Admitting: *Deleted

## 2021-10-18 NOTE — Telephone Encounter (Signed)
?  Chief Complaint: Cellulitis ?Symptoms: Left side of neck with cellulitis, dx 10/14/21 in ED. "No abscess"  States "A little more painful" Denies worsening,spreading "Just not any better." Hard to turn head. On day 3 out of 7 of Doxy. ?Frequency: 10/14/21 ?Pertinent Negatives: Patient denies  ?Disposition: [] ED /[] Urgent Care (no appt availability in office) / [] Appointment(In office/virtual)/ []  [facility]/ [] Home Care/ [] Refused Recommended Disposition /[] [facility]/ []  Follow-up with PCP ?Additional Notes: Advised to alert PCP. States will do so. Advised ED for worsening symptoms.  ?Reason for Disposition ? [1] Taking antibiotic > 72 hours (3 days) AND [2] infected wound not improved (i.e., pain, pus, redness) ? ?Answer Assessment - Initial Assessment Questions ?1. LOCATION: "Where is the wound located?"  ?    Left side 1/2 way down to base of neck ?2. WOUND APPEARANCE: "What does the wound look like?"  ?    'Cellulitis' dx in ED 10/14/21 ?3. SIZE: If redness is present, ask: "What is the size of the red area?" (Inches, centimeters, or compare to size of a coin)  ?     ?4. SPREAD: "What's changed in the last day?"  "Do you see any red streaks coming from the wound?" ?    HAs not spread, little more painful ?5. ONSET: "When did it start to look infected?"  ?    10/14/21 ?6. MECHANISM: "How did the wound start, what was the cause?" ?     ?7. PAIN: "Is there any pain?" If Yes, ask: "How bad is the pain?"   (Scale 1-10; 5-6/10 ?or mild, moderate, severe) ?     ?8. FEVER: "Do you have a fever?" If Yes, ask: "What is your temperature, how was it measured, and when did it start?" ?    No ?9. OTHER SYMPTOMS: "Do you have any other symptoms?" (e.g., shaking chills, weakness, rash elsewhere on body) ?    no ? ?Protocols used: Wound Infection-A-AH ? ?

## 2021-11-04 ENCOUNTER — Other Ambulatory Visit: Payer: Self-pay | Admitting: Cardiology

## 2022-01-12 NOTE — Progress Notes (Deleted)
PCP:  Karle Plumber, MD Primary Cardiologist: Will Jorja Loa, MD Electrophysiologist: Regan Lemming, MD   Terrance Hodges is a 46 y.o. male seen today for Will Jorja Loa, MD for routine electrophysiology followup.  Since last being seen in our clinic the patient reports doing ***.  he denies chest pain, palpitations, dyspnea, PND, orthopnea, nausea, vomiting, dizziness, syncope, edema, weight gain, or early satiety.  Past Medical History:  Diagnosis Date   A-fib (HCC)    Diabetes mellitus without complication (HCC)    History of hematuria    Hypertension    Prostatitis    Reflux    SVT (supraventricular tachycardia) (HCC)    Past Surgical History:  Procedure Laterality Date   ADENOIDECTOMY     KNEE SURGERY     TONSILLECTOMY     URETER SURGERY     WISDOM TOOTH EXTRACTION      Current Outpatient Medications  Medication Sig Dispense Refill   Buprenorphine HCl-Naloxone HCl 8-2 MG FILM Place 1 Film under the tongue in the morning and at bedtime.     carvedilol (COREG) 6.25 MG tablet TAKE 1 TABLET BY MOUTH TWICE A DAY 180 tablet 3   dexlansoprazole (DEXILANT) 60 MG capsule Take 60 mg by mouth daily.     diltiazem (CARDIZEM CD) 360 MG 24 hr capsule TAKE 1 CAPSULE BY MOUTH DAILY 90 capsule 1   doxycycline (VIBRAMYCIN) 100 MG capsule Take 1 capsule (100 mg total) by mouth 2 (two) times daily. 14 capsule 0   fenofibrate micronized (LOFIBRA) 134 MG capsule Take 134 mg by mouth daily before breakfast.     flecainide (TAMBOCOR) 150 MG tablet TAKE 1 TABLET BY MOUTH 2 TIMES A DAY 180 tablet 3   rosuvastatin (CRESTOR) 20 MG tablet Take 20 mg by mouth daily.     sitaGLIPtin-metformin (JANUMET) 50-500 MG tablet Take 50-500 tablets by mouth 2 (two) times daily.     tamsulosin (FLOMAX) 0.4 MG CAPS capsule Take 0.4 mg by mouth daily.     No current facility-administered medications for this visit.    Allergies  Allergen Reactions   Bactrim [Sulfamethoxazole-Trimethoprim]     Clindamycin/Lincomycin Hives   Metoprolol    Penicillins    Pineapple    Toradol [Ketorolac Tromethamine] Hives   Tramadol    Vicodin [Hydrocodone-Acetaminophen] Hives   Fentanyl Rash    Patient states he can tolerate   Wellbutrin [Bupropion] Rash    Social History   Socioeconomic History   Marital status: Married    Spouse name: Not on file   Number of children: Not on file   Years of education: Not on file   Highest education level: Not on file  Occupational History   Not on file  Tobacco Use   Smoking status: Every Day    Packs/day: 0.50    Years: 15.00    Total pack years: 7.50    Types: Cigarettes   Smokeless tobacco: Never  Vaping Use   Vaping Use: Never used  Substance and Sexual Activity   Alcohol use: No   Drug use: No   Sexual activity: Yes  Other Topics Concern   Not on file  Social History Narrative   Not on file   Social Determinants of Health   Financial Resource Strain: Not on file  Food Insecurity: Not on file  Transportation Needs: Not on file  Physical Activity: Not on file  Stress: Not on file  Social Connections: Not on file  Intimate Partner  Violence: Not on file     Review of Systems: All other systems reviewed and are otherwise negative except as noted above.  Physical Exam: There were no vitals filed for this visit.  GEN- The patient is well appearing, alert and oriented x 3 today.   HEENT: normocephalic, atraumatic; sclera clear, conjunctiva pink; hearing intact; oropharynx clear; neck supple, no JVP Lymph- no cervical lymphadenopathy Lungs- Clear to ausculation bilaterally, normal work of breathing.  No wheezes, rales, rhonchi Heart- Regular rate and rhythm, no murmurs, rubs or gallops, PMI not laterally displaced GI- soft, non-tender, non-distended, bowel sounds present, no hepatosplenomegaly Extremities- no clubbing, cyanosis, or edema; DP/PT/radial pulses 2+ bilaterally MS- no significant deformity or atrophy Skin- warm  and dry, no rash or lesion Psych- euthymic mood, full affect Neuro- strength and sensation are intact  EKG is ordered. Personal review of EKG from today shows ***  Additional studies reviewed include: Previous EP office notes. ***  Assessment and Plan:  1. Palpitations 2. SVT EKG today shows *** Recently having more palpitations *** Monitor   3. Paroxysmal AF On flecainide and dilt for his SVT.  Not on OAC with CHA2DS2VASc  of 0.   Follow up with {Blank single:19197::"Dr. Allred","Dr. Amada Jupiter. Klein","Dr. Camnitz","Dr. Lambert","EP APP"} in {Blank single:19197::"2 weeks","4 weeks","3 months","6 months","12 months","as usual post gen change"}   Graciella Freer, [place]  01/12/22 8:20 AM

## 2022-01-19 ENCOUNTER — Ambulatory Visit: Payer: BC Managed Care – PPO | Admitting: Student

## 2022-01-19 DIAGNOSIS — I480 Paroxysmal atrial fibrillation: Secondary | ICD-10-CM

## 2022-01-19 DIAGNOSIS — I471 Supraventricular tachycardia: Secondary | ICD-10-CM

## 2022-01-19 DIAGNOSIS — R002 Palpitations: Secondary | ICD-10-CM

## 2022-04-21 ENCOUNTER — Emergency Department (HOSPITAL_BASED_OUTPATIENT_CLINIC_OR_DEPARTMENT_OTHER)
Admission: EM | Admit: 2022-04-21 | Discharge: 2022-04-21 | Disposition: A | Discharge status: Home / Self Care | Payer: BC Managed Care – PPO | Attending: Emergency Medicine | Admitting: Emergency Medicine

## 2022-04-21 ENCOUNTER — Other Ambulatory Visit: Payer: Self-pay

## 2022-04-21 DIAGNOSIS — K0889 Other specified disorders of teeth and supporting structures: Secondary | ICD-10-CM | POA: Insufficient documentation

## 2022-04-21 DIAGNOSIS — E119 Type 2 diabetes mellitus without complications: Secondary | ICD-10-CM | POA: Insufficient documentation

## 2022-04-21 DIAGNOSIS — I10 Essential (primary) hypertension: Secondary | ICD-10-CM | POA: Insufficient documentation

## 2022-04-21 DIAGNOSIS — Z79899 Other long term (current) drug therapy: Secondary | ICD-10-CM | POA: Insufficient documentation

## 2022-04-21 NOTE — ED Provider Notes (Signed)
[facility] EMERGENCY DEPARTMENT Provider Note   CSN: [id number] Arrival date & time: 04/21/22  0735     History  Chief Complaint  Patient presents with   Dental Pain    Terrance Hodges is a 46 y.o. male.  The history is provided by the patient, the spouse and medical records. No language interpreter was used.  Dental Pain Location:  Lower Lower teeth location: left porterior. Quality:  Aching and shooting Severity:  Severe Onset quality:  Gradual Duration:  1 week Timing:  Constant Progression:  Waxing and waning Chronicity:  New Relieved by:  Nothing Worsened by:  Nothing Ineffective treatments: levaquin. Associated symptoms: no congestion, no difficulty swallowing, no drooling, no fever, no headaches, no neck pain, no neck swelling, no oral bleeding, no oral lesions and no trismus        Home Medications Prior to Admission medications   Medication Sig Start Date End Date Taking? Authorizing Provider  Buprenorphine HCl-Naloxone HCl 8-2 MG FILM Place 1 Film under the tongue in the morning and at bedtime.    [provider]  carvedilol (COREG) 6.25 MG tablet TAKE 1 TABLET BY MOUTH TWICE A DAY 12/24/19   Sheilah Pigeon, PA-C  dexlansoprazole (DEXILANT) 60 MG capsule Take 60 mg by mouth daily. 05/12/17   [provider]  diltiazem (CARDIZEM CD) 360 MG 24 hr capsule TAKE 1 CAPSULE BY MOUTH DAILY 11/05/21   Camnitz, Andree Coss, MD  doxycycline (VIBRAMYCIN) 100 MG capsule Take 1 capsule (100 mg total) by mouth 2 (two) times daily. 10/14/21   Cresenzo, Cyndi Lennert, MD  fenofibrate micronized (LOFIBRA) 134 MG capsule Take 134 mg by mouth daily before breakfast.    [provider]  flecainide (TAMBOCOR) 150 MG tablet TAKE 1 TABLET BY MOUTH 2 TIMES A DAY 07/06/21   Camnitz, Will Daphine Deutscher, MD  rosuvastatin (CRESTOR) 20 MG tablet Take 20 mg by mouth daily. 04/01/21   [provider]  sitaGLIPtin-metformin (JANUMET) 50-500 MG tablet Take 50-500  tablets by mouth 2 (two) times daily. 01/31/20   [provider]  tamsulosin (FLOMAX) 0.4 MG CAPS capsule Take 0.4 mg by mouth daily.    [provider]      Allergies    Bactrim [sulfamethoxazole-trimethoprim], Clindamycin/lincomycin, Metoprolol, Penicillins, Pineapple, Toradol [ketorolac tromethamine], Tramadol, Vicodin [hydrocodone-acetaminophen], Fentanyl, and Wellbutrin [bupropion]    Review of Systems   Review of Systems  Constitutional:  Negative for chills, fatigue and fever.  HENT:  Positive for dental problem. Negative for congestion, drooling, mouth sores, rhinorrhea, sore throat and trouble swallowing.   Eyes:  Negative for visual disturbance.  Respiratory:  Negative for cough and shortness of breath.   Cardiovascular:  Negative for chest pain.  Gastrointestinal:  Negative for abdominal pain.  Genitourinary:  Negative for dysuria.  Musculoskeletal:  Negative for back pain and neck pain.  Neurological:  Negative for light-headedness and headaches.  Psychiatric/Behavioral:  Negative for agitation.   All other systems reviewed and are negative.   Physical Exam Updated Vital Signs BP 114/78 (BP Location: Right Arm)   Pulse 79   Temp 98.5 F (36.9 C) (Oral)   Resp 17   Ht 5\' 10"  (1.778 m)   Wt 97.5 kg   SpO2 98%   BMI 30.85 kg/m  Physical Exam Vitals and nursing note reviewed.  Constitutional:      General: He is not in acute distress.    Appearance: He is well-developed. He is not ill-appearing, toxic-appearing or diaphoretic.  HENT:     Head: Normocephalic and atraumatic.     Nose: Nose normal. No congestion or rhinorrhea.     Mouth/Throat:     Mouth: Mucous membranes are moist. No lacerations, oral lesions or angioedema.     Dentition: Abnormal dentition. Dental caries present.     Tongue: No lesions. Tongue does not deviate from midline.     Pharynx: Oropharynx is clear. Uvula midline. No pharyngeal swelling, oropharyngeal exudate, posterior  oropharyngeal erythema or uvula swelling.     Tonsils: No tonsillar exudate or tonsillar abscesses.      Comments: Left posterior dental tenderness with cracked tooth.  No large swelling or erythematous pocket seen. Eyes:     Conjunctiva/sclera: Conjunctivae normal.     Pupils: Pupils are equal, round, and reactive to light.  Neck:     Vascular: No carotid bruit.  Cardiovascular:     Rate and Rhythm: Normal rate and regular rhythm.     Heart sounds: No murmur heard. Pulmonary:     Effort: Pulmonary effort is normal. No respiratory distress.     Breath sounds: Normal breath sounds. No wheezing, rhonchi or rales.  Chest:     Chest wall: No tenderness.  Abdominal:     Palpations: Abdomen is soft.     Tenderness: There is no abdominal tenderness.  Musculoskeletal:        General: No swelling.     Cervical back: Neck supple. No tenderness.  Skin:    General: Skin is warm and dry.     Capillary Refill: Capillary refill takes less than 2 seconds.  Neurological:     Mental Status: He is alert.  Psychiatric:        Mood and Affect: Mood normal.     ED Results / Procedures / Treatments   Labs (all labs ordered are listed, but only abnormal results are displayed) Labs Reviewed - No data to display  EKG None  Radiology No results found.  Procedures Procedures    Medications Ordered in ED Medications - No data to display  ED Course/ Medical Decision Making/ A&P                           Medical Decision Making  Terrance Hodges is a 46 y.o. male with a past medical history significant for hypertension, diabetes, previous atrial fibrillation, and previous SVT who presents with dental pain.  According to patient, for the last week he has had pain in his left lower jaw and was started on Levaquin by his PCP for suspected dental infection.  He reports he is finishing the antibiotic and symptoms are persistent.  He wanted to see his dentist but said he had to come to the emergency  department first before he could get an appointment.  He reports no fevers, chills, chest pain, shortness breath, nausea, vomiting, constipation, diarrhea, or urinary symptoms.  Denies difficulty swallowing or breathing and has no neck pain or soreness.  He reports no drainage but is having waxing and waning left lower posterior dental pain where he has a cracked tooth and poor dentition.  He reports the pain gets severe at times but is now mild to moderate.  It waxes and wanes.  On my exam, lungs clear and chest nontender.  Abdomen nontender.  No carotid bruit appreciated.  No stridor.  Oropharyngeal exam had no evidence of PTA, RPA, and uvula is midline.  His dictation did have dental caries  and he is missing some teeth in his left lower jaw.  He a large cracked tooth and his gumline was tender to palpation.  I did not see a large bulge or erythematous swelling that would be amenable to bedside drainage however I do suspect he has continued dental infection.    We discussed giving him new antibiotics he rather now go see his dentist for further management given his history of allergies and intolerances to medication.  We agreed to hold on labs and imaging at this time and will instead have him follow-up with his dentist he requested.  He will call them now to get seen as soon as possible and understands return precautions and follow-up instructions.  He knows to return if any symptoms were to change or worsen acutely for further labs and possible imaging.  He no other questions or concerns and was discharged in good condition.        Final Clinical Impression(s) / ED Diagnoses Final diagnoses:  Pain, dental    Rx / DC Orders ED Discharge Orders          Ordered    Ambulatory referral to Dentistry       Comments: PT would like to see Dr. Davina Poke   04/21/22 0850           Clinical Impression: 1. Pain, dental     Disposition: Discharge  Condition: Good  I have discussed  the results, Dx and Tx plan with the pt(& family if present). He/she/they expressed understanding and agree(s) with the plan. Discharge instructions discussed at great length. Strict return precautions discussed and pt &/or family have verbalized understanding of the instructions. No further questions at time of discharge.    New Prescriptions   No medications on file    Follow Up: Everardo All, DDS [address] [place] [address] [phone]     [facility] [address] [id number] YP [address] [place] [address] [phone]       Jaliana Medellin, Canary Brim, MD 04/21/22 [phone]

## 2022-04-21 NOTE — Discharge Instructions (Signed)
Your history, exam, and evaluation today are consistent with continued dental pain despite the antibiotics you have been on the last week for suspected dental infection.  You have tenderness in the left lower jaw but did not have appreciable swelling or erythema.  I do not see acute drainage or large abscess amenable to drainage at the bedside.  Due to the continued and worsening pain, please follow-up with your dentist as soon as possible for further management.  We had a shared decision-making conversation and today agreed to hold on labs or imaging at this time but do recommend you see your dentist as quickly as possible.

## 2022-04-21 NOTE — ED Triage Notes (Signed)
Left jaw pain, slight swelling.  Family believes he has a broken tooth.  Pt needs dentist referral to Dr. Davina Poke

## 2022-05-18 ENCOUNTER — Other Ambulatory Visit: Payer: Self-pay | Admitting: Cardiology

## 2022-06-20 ENCOUNTER — Other Ambulatory Visit: Payer: Self-pay | Admitting: Cardiology

## 2022-07-04 ENCOUNTER — Telehealth: Payer: Self-pay | Admitting: Cardiology

## 2022-07-04 ENCOUNTER — Other Ambulatory Visit: Payer: Self-pay | Admitting: *Deleted

## 2022-07-04 MED ORDER — CARVEDILOL 6.25 MG PO TABS: 6.2500 mg | ORAL_TABLET | Freq: Two times a day (BID) | ORAL | 0 refills | Status: DC

## 2022-07-04 MED ORDER — FLECAINIDE ACETATE 150 MG PO TABS
150.0000 mg | ORAL_TABLET | Freq: Two times a day (BID) | ORAL | 2 refills | Status: DC
Start: 2022-07-04 — End: 2022-09-07

## 2022-07-04 MED ORDER — CARVEDILOL 6.25 MG PO TABS: 6.2500 mg | ORAL_TABLET | Freq: Two times a day (BID) | ORAL | 2 refills | Status: DC

## 2022-07-04 MED ORDER — DILTIAZEM HCL ER COATED BEADS 360 MG PO CP24: 360.0000 mg | ORAL_CAPSULE | Freq: Every day | ORAL | 0 refills | Status: DC

## 2022-07-04 MED ORDER — FLECAINIDE ACETATE 150 MG PO TABS: 150.0000 mg | ORAL_TABLET | Freq: Two times a day (BID) | ORAL | 0 refills | Status: DC

## 2022-07-04 MED ORDER — DILTIAZEM HCL ER COATED BEADS 360 MG PO CP24: 360.0000 mg | ORAL_CAPSULE | Freq: Every day | ORAL | 2 refills | Status: DC

## 2022-07-04 NOTE — Telephone Encounter (Signed)
Pt has appt 09/07/22 with Dr. Curt Bears.  Sent in 90 day refills for medications requested below.

## 2022-07-04 NOTE — Telephone Encounter (Signed)
*  STAT* If patient is at the pharmacy, call can be transferred to refill team.   1. Which medications need to be refilled? (please list name of each medication and dose if known)  diltiazem (CARDIZEM CD) 360 MG 24 hr capsule   flecainide (TAMBOCOR) 150 MG tablet   carvedilol (COREG) 6.25 MG tablet   2. Which pharmacy/location (including street and city if local pharmacy) is medication to be sent to? [facility], [place] - [address]   3. Do they need a 30 day or 90 day supply? 90 day  Patient has an appointment 09/07/2022

## 2022-09-07 ENCOUNTER — Encounter: Payer: Self-pay | Admitting: Cardiology

## 2022-09-07 ENCOUNTER — Ambulatory Visit: Payer: BC Managed Care – PPO | Attending: Cardiology | Admitting: Cardiology

## 2022-09-07 VITALS — BP 110/74 | HR 59 | Ht 70.0 in | Wt 215.4 lb

## 2022-09-07 DIAGNOSIS — I471 Supraventricular tachycardia, unspecified: Secondary | ICD-10-CM | POA: Diagnosis not present

## 2022-09-07 DIAGNOSIS — I480 Paroxysmal atrial fibrillation: Secondary | ICD-10-CM

## 2022-09-07 MED ORDER — FLECAINIDE ACETATE 150 MG PO TABS: 150.0000 mg | ORAL_TABLET | Freq: Two times a day (BID) | ORAL | 3 refills | Status: DC

## 2022-09-07 MED ORDER — DILTIAZEM HCL ER COATED BEADS 360 MG PO CP24: 360.0000 mg | ORAL_CAPSULE | Freq: Every day | ORAL | 3 refills | Status: DC

## 2022-09-07 MED ORDER — CARVEDILOL 6.25 MG PO TABS: 6.2500 mg | ORAL_TABLET | Freq: Two times a day (BID) | ORAL | 3 refills | Status: DC

## 2022-09-07 NOTE — Patient Instructions (Signed)
Medication Instructions:  Your physician recommends that you continue on your current medications as directed. Please refer to the Current Medication list given to you today.  *If you need a refill on your cardiac medications before your next appointment, please call your pharmacy*  Lab Work: None ordered  Testing/Procedures: None ordered  Follow-Up: At [facility], you and your health needs are our priority.  As part of our continuing mission to provide you with exceptional heart care, we have created designated Provider Care Teams.  These Care Teams include your primary Cardiologist (physician) and Advanced Practice Providers (APPs -  Physician Assistants and Nurse Practitioners) who all work together to provide you with the care you need, when you need it.  Your next appointment:   1 year(s)  The format for your next appointment:   In Person  Provider:   Tommye Standard, PA-C

## 2022-09-07 NOTE — Progress Notes (Signed)
Electrophysiology Office Note   Date:  09/07/2022   ID:  JAEDYN DEMELLO, DOB [birth year], MRN [id number]  PCP:  Guadlupe Spanish, MD  Primary Electrophysiologist:  Constance Haw, MD    No chief complaint on file.     History of Present Illness: Terrance Hodges is a 47 y.o. male who is being seen today for the evaluation of SVT and atrial fibrillation at the request of Arvind, Aldean Baker, MD. Presenting today for electrophysiology evaluation.    He was seen in the emergency room 04/09/2016 with episodes of SVT and heart rates above 200 bpm.  SVT responded to Valsalva maneuvers and he converted to sinus rhythm.  He also has a history of atrial fibrillation.  He is on flecainide and metoprolol.  Today, denies symptoms of palpitations, chest pain, shortness of breath, orthopnea, PND, lower extremity edema, claudication, dizziness, presyncope, syncope, bleeding, or neurologic sequela. The patient is tolerating medications without difficulties.  Since being seen he has done well.  He has had no chest pain or shortness of breath.  He has only had minimal palpitations.   Past Medical History:  Diagnosis Date   A-fib    Diabetes mellitus without complication    History of hematuria    Hypertension    Prostatitis    Reflux    SVT (supraventricular tachycardia)    Past Surgical History:  Procedure Laterality Date   ADENOIDECTOMY     KNEE SURGERY     TONSILLECTOMY     URETER SURGERY     WISDOM TOOTH EXTRACTION       Current Outpatient Medications  Medication Sig Dispense Refill   Buprenorphine HCl-Naloxone HCl 8-2 MG FILM Place 1 Film under the tongue in the morning and at bedtime.     dexlansoprazole (DEXILANT) 60 MG capsule Take 60 mg by mouth daily.     FARXIGA 10 MG TABS tablet Take 10 mg by mouth daily.     fenofibrate micronized (LOFIBRA) 134 MG capsule Take 134 mg by mouth daily before breakfast.     rOPINIRole (REQUIP) 1 MG tablet Take 1 mg by mouth at bedtime.      rosuvastatin (CRESTOR) 20 MG tablet Take 20 mg by mouth daily.     tamsulosin (FLOMAX) 0.4 MG CAPS capsule Take 0.4 mg by mouth daily.     TRULICITY 4.5 0000000 SOPN Inject 0.5 mLs into the skin once a week.     carvedilol (COREG) 6.25 MG tablet Take 1 tablet (6.25 mg total) by mouth 2 (two) times daily. 180 tablet 3   diltiazem (CARDIZEM CD) 360 MG 24 hr capsule Take 1 capsule (360 mg total) by mouth daily. 90 capsule 3   flecainide (TAMBOCOR) 150 MG tablet Take 1 tablet (150 mg total) by mouth 2 (two) times daily. 180 tablet 3   No current facility-administered medications for this visit.    Allergies:   Bactrim [sulfamethoxazole-trimethoprim], Clindamycin/lincomycin, Metoprolol, Penicillins, Pineapple, Toradol [ketorolac tromethamine], Tramadol, Vicodin [hydrocodone-acetaminophen], Fentanyl, and Wellbutrin [bupropion]   Social History:  The patient  reports that he has been smoking cigarettes. He has a 7.50 pack-year smoking history. He has never used smokeless tobacco. He reports that he does not drink alcohol and does not use drugs.   Family History:  The patient's family history includes Heart attack in his father; Hypertension in his mother.   ROS:  Please see the history of present illness.   Otherwise, review of systems is positive for none.   All  other systems are reviewed and negative.   PHYSICAL EXAM: VS:  BP 110/74   Pulse (!) 59   Ht 5\' 10"  (1.778 m)   Wt 215 lb 6.4 oz (97.7 kg)   SpO2 94%   BMI 30.91 kg/m  , BMI Body mass index is 30.91 kg/m. GEN: Well nourished, well developed, in no acute distress  HEENT: normal  Neck: no JVD, carotid bruits, or masses Cardiac: RRR; no murmurs, rubs, or gallops,no edema  Respiratory:  clear to auscultation bilaterally, normal work of breathing GI: soft, nontender, nondistended, + BS MS: no deformity or atrophy  Skin: warm and dry Neuro:  Strength and sensation are intact Psych: euthymic mood, full affect  EKG:  EKG is ordered  today. Personal review of the ekg ordered shows sinus rhythm   Recent Labs: 10/14/2021: BUN 18; Creatinine, Ser 1.62; Hemoglobin 15.7; Platelets 262; Potassium 4.0; Sodium 138    Lipid Panel  No results found for: "CHOL", "TRIG", "HDL", "CHOLHDL", "VLDL", "LDLCALC", "LDLDIRECT"   Wt Readings from Last 3 Encounters:  09/07/22 215 lb 6.4 oz (97.7 kg)  04/21/22 215 lb (97.5 kg)  10/14/21 215 lb (97.5 kg)      Other studies Reviewed: Additional studies/ records that were reviewed today include: Primary physician outside records  ASSESSMENT AND PLAN:  1.  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  Currently on flecainide and diltiazem with improvement in symptoms.  Not had recurrence.  Continue with medical management.  2.  Atrial fibrillation: Currently on flecainide and diltiazem.  CHA2DS2-VASc of 0.  No further recurrences.  Continue with current management.  3.  High risk medication monitoring: Currently on flecainide.  QRS remains stable.  Continue with current management.   Current medicines are reviewed at length with the patient today.   The patient does not have concerns regarding his medicines.  The following changes were made today: none  Labs/ tests ordered today include:  Orders Placed This Encounter  Procedures   EKG 12-Lead      Disposition:   FU 12 months  Signed, Yuma Pacella Meredith Leeds, MD  09/07/2022 8:29 AM     [facility] [address] [place] [address] [phone] (office) [phone] (fax)

## 2023-01-04 ENCOUNTER — Emergency Department (HOSPITAL_BASED_OUTPATIENT_CLINIC_OR_DEPARTMENT_OTHER)
Admission: EM | Admit: 2023-01-04 | Discharge: 2023-01-04 | Disposition: A | Discharge status: Home / Self Care | Payer: BC Managed Care – PPO | Attending: Emergency Medicine | Admitting: Emergency Medicine

## 2023-01-04 ENCOUNTER — Encounter (HOSPITAL_BASED_OUTPATIENT_CLINIC_OR_DEPARTMENT_OTHER): Payer: Self-pay | Admitting: Emergency Medicine

## 2023-01-04 ENCOUNTER — Other Ambulatory Visit: Payer: Self-pay

## 2023-01-04 DIAGNOSIS — E119 Type 2 diabetes mellitus without complications: Secondary | ICD-10-CM | POA: Insufficient documentation

## 2023-01-04 DIAGNOSIS — I10 Essential (primary) hypertension: Secondary | ICD-10-CM | POA: Diagnosis not present

## 2023-01-04 DIAGNOSIS — K029 Dental caries, unspecified: Secondary | ICD-10-CM | POA: Insufficient documentation

## 2023-01-04 DIAGNOSIS — Z7984 Long term (current) use of oral hypoglycemic drugs: Secondary | ICD-10-CM | POA: Insufficient documentation

## 2023-01-04 DIAGNOSIS — K0889 Other specified disorders of teeth and supporting structures: Secondary | ICD-10-CM | POA: Diagnosis present

## 2023-01-04 DIAGNOSIS — Z79899 Other long term (current) drug therapy: Secondary | ICD-10-CM | POA: Diagnosis not present

## 2023-01-04 DIAGNOSIS — Z794 Long term (current) use of insulin: Secondary | ICD-10-CM | POA: Insufficient documentation

## 2023-01-04 NOTE — Discharge Instructions (Signed)
Please read and follow all provided instructions.  Your diagnoses today include:  1. Pain, dental    The exam and treatment you received today has been provided on an emergency basis only. This is not a substitute for complete medical or dental care.  Tests performed today include: Vital signs. See below for your results today.   Medications prescribed:  None  Take any prescribed medications only as directed.  Home care instructions:  Follow any educational materials contained in this packet.  Follow-up instructions: Please follow-up with your dentist for further evaluation of your symptoms.   Return instructions:  Please return to the Emergency Department if you experience worsening symptoms. Please return if you develop a fever, you develop more swelling in your face or neck, you have trouble breathing or swallowing food. Please return if you have any other emergent concerns.  Additional Information:  Your vital signs today were: BP 117/75 (BP Location: Right Arm)   Pulse 69   Temp 97.8 F (36.6 C)   Resp 18   Ht 5\' 10"  (1.778 m)   Wt 98.9 kg   SpO2 96%   BMI 31.28 kg/m  If your blood pressure (BP) was elevated above 135/85 this visit, please have this repeated by your doctor within one month. --------------

## 2023-01-04 NOTE — ED Triage Notes (Signed)
Pt having dental pain to right lower tooth.  Pt needs paperwork showing he was seen today for dental visit to be covered completely.

## 2023-01-04 NOTE — ED Provider Notes (Signed)
EMERGENCY DEPARTMENT AT [facility] Provider Note   CSN: [id number] Arrival date & time: 01/04/23  1650     History  Chief Complaint  Patient presents with   Dental Pain    Terrance Hodges is a 47 y.o. male.  Patient with history of diabetes, atrial fibrillation, hypertension presents for evaluation of dental pain.  Patient has broken teeth in the right lower jaw.  He reports ongoing pain from these.  He reports that he is seeing a Patent examiner.  He has not had swelling or fevers.  He is not on anticoagulation.  He is requesting paperwork stating that he was evaluated for dental pain.       Home Medications Prior to Admission medications   Medication Sig Start Date End Date Taking? Authorizing Provider  Buprenorphine HCl-Naloxone HCl 8-2 MG FILM Place 1 Film under the tongue in the morning and at bedtime.    [provider]  carvedilol (COREG) 6.25 MG tablet Take 1 tablet (6.25 mg total) by mouth 2 (two) times daily. 09/07/22   Camnitz, Will Daphine Deutscher, MD  dexlansoprazole (DEXILANT) 60 MG capsule Take 60 mg by mouth daily. 05/12/17   [provider]  diltiazem (CARDIZEM CD) 360 MG 24 hr capsule Take 1 capsule (360 mg total) by mouth daily. 09/07/22   Camnitz, Will Daphine Deutscher, MD  FARXIGA 10 MG TABS tablet Take 10 mg by mouth daily. 05/04/21   [provider]  fenofibrate micronized (LOFIBRA) 134 MG capsule Take 134 mg by mouth daily before breakfast.    [provider]  flecainide (TAMBOCOR) 150 MG tablet Take 1 tablet (150 mg total) by mouth 2 (two) times daily. 09/07/22   Camnitz, Will Daphine Deutscher, MD  rOPINIRole (REQUIP) 1 MG tablet Take 1 mg by mouth at bedtime. 08/31/22   [provider]  rosuvastatin (CRESTOR) 20 MG tablet Take 20 mg by mouth daily. 04/01/21   [provider]  tamsulosin (FLOMAX) 0.4 MG CAPS capsule Take 0.4 mg by mouth daily.    [provider]  TRULICITY 4.5 MG/0.5ML SOPN Inject 0.5 mLs into  the skin once a week. 08/29/22   [provider]      Allergies    Clindamycin/lincomycin, Metoprolol, Penicillins, Pineapple, Toradol [ketorolac tromethamine], Tramadol, Vicodin [hydrocodone-acetaminophen], Fentanyl, and Wellbutrin [bupropion]    Review of Systems   Review of Systems  Physical Exam Updated Vital Signs BP 117/75 (BP Location: Right Arm)   Pulse 69   Temp 97.8 F (36.6 C)   Resp 18   Ht 5\' 10"  (1.778 m)   Wt 98.9 kg   SpO2 96%   BMI 31.28 kg/m  Physical Exam Vitals and nursing note reviewed.  Constitutional:      Appearance: He is well-developed.  HENT:     Head: Normocephalic and atraumatic.     Jaw: No trismus.     Right Ear: Tympanic membrane, ear canal and external ear normal.     Left Ear: Tympanic membrane, ear canal and external ear normal.     Nose: Nose normal.     Mouth/Throat:     Mouth: Mucous membranes are moist.     Dentition: Abnormal dentition. Dental caries present. No dental abscesses.     Pharynx: Uvula midline. No uvula swelling.     Tonsils: No tonsillar abscesses.     Comments: Patient with very poor dentition, gingival hypertrophy, several broken teeth with large cavities in the right lower jaw.  No gross abscess or  cellulitis noted.  No bleeding. Eyes:     Pupils: Pupils are equal, round, and reactive to light.  Neck:     Comments: No neck swelling or Lugwig's angina Musculoskeletal:     Cervical back: Normal range of motion and neck supple.  Skin:    General: Skin is warm and dry.  Neurological:     Mental Status: He is alert.  Psychiatric:        Mood and Affect: Mood normal.     ED Results / Procedures / Treatments   Labs (all labs ordered are listed, but only abnormal results are displayed) Labs Reviewed - No data to display  EKG None  Radiology No results found.  Procedures Procedures    Medications Ordered in ED Medications - No data to display  ED Course/ Medical Decision Making/ A&P     Patient seen and examined. History obtained directly from patient.   Labs/EKG: None ordered.  Imaging: None ordered.  Medications/Fluids: None ordered  Most recent vital signs reviewed and are as follows: BP 117/75 (BP Location: Right Arm)   Pulse 69   Temp 97.8 F (36.6 C)   Resp 18   Ht 5\' 10"  (1.778 m)   Wt 98.9 kg   SpO2 96%   BMI 31.28 kg/m   Initial impression: dental pain  Plan: Discharge to home.   Prescriptions written for: None  Other home care instructions discussed: Avoidance of chewing or other activities that makes the symptoms worse. Eat soft foods if needed and maintain good hydration.   ED return instructions discussed: Encouraged patient to return with worsening facial or neck swelling, difficulty breathing or swallowing, fever.   Follow-up instructions discussed: Patient encouraged to follow-up with his dentist as planned.                                 Medical Decision Making  Patient presents for dental pain. They do not have a fever and do not appear septic. Exam unconcerning for Ludwig's angina or other deep tissue infection in neck and I do not feel that advanced imaging is indicated at this time. Low suspicion for PTA, RPA, epiglottis based on exam.         Final Clinical Impression(s) / ED Diagnoses Final diagnoses:  Pain, dental    Rx / DC Orders ED Discharge Orders     None         Renne Crigler, PA-C 01/04/23 1835    Lonell Grandchild, MD 01/06/23 [phone]

## 2023-03-28 ENCOUNTER — Encounter: Payer: Self-pay | Admitting: Cardiology

## 2023-03-28 NOTE — Telephone Encounter (Signed)
Error

## 2023-04-03 ENCOUNTER — Other Ambulatory Visit: Payer: Self-pay | Admitting: Cardiology

## 2023-04-18 NOTE — Progress Notes (Deleted)
Cardiology Office Note Date:  04/18/2023  Patient ID:  Terrance Hodges, Terrance Hodges 14-Jan-1976, MRN [id number] PCP:  Karle Plumber, MD  EP:  Dr. Elberta Fortis     Chief Complaint:  *** ??  History of Present Illness: BRIDGE MARZ is a 47 y.o. male with history of HTN, SVT (responds to valsalva),  DM, AFib.  Following with [place] over the years, mostly annually, last saw 09/07/22, Dr. Elberta Fortis, doing well, no changes made No symptoms of either SVT/AFib  *** flecainide, EKG, nodal blocker *** ? Diabetes *** ? 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 2018 AFib 2021 Flecainide started 2017   Past Medical History:  Diagnosis Date   A-fib (HCC)    Diabetes mellitus without complication (HCC)    History of hematuria    Hypertension    Prostatitis    Reflux    SVT (supraventricular tachycardia)     Past Surgical History:  Procedure Laterality Date   ADENOIDECTOMY     KNEE SURGERY     TONSILLECTOMY     URETER SURGERY     WISDOM TOOTH EXTRACTION      Current Outpatient Medications  Medication Sig Dispense Refill   Buprenorphine HCl-Naloxone HCl 8-2 MG FILM Place 1 Film under the tongue in the morning and at bedtime.     carvedilol (COREG) 6.25 MG tablet Take 1 tablet (6.25 mg total) by mouth 2 (two) times daily. 180 tablet 3   dexlansoprazole (DEXILANT) 60 MG capsule Take 60 mg by mouth daily.     diltiazem (CARDIZEM CD) 360 MG 24 hr capsule TAKE 1 CAPSULE BY MOUTH DAILY 90 capsule 3   FARXIGA 10 MG TABS tablet Take 10 mg by mouth daily.     fenofibrate micronized (LOFIBRA) 134 MG capsule Take 134 mg by mouth daily before breakfast.     flecainide (TAMBOCOR) 150 MG tablet Take 1 tablet (150 mg total) by mouth 2 (two) times daily. 180 tablet 3   rOPINIRole (REQUIP) 1 MG tablet Take 1 mg by mouth at bedtime.     rosuvastatin (CRESTOR) 20 MG tablet Take 20 mg by mouth daily.     tamsulosin (FLOMAX) 0.4 MG CAPS capsule Take 0.4 mg by mouth daily.      TRULICITY 4.5 MG/0.5ML SOPN Inject 0.5 mLs into the skin once a week.     No current facility-administered medications for this visit.    Allergies:   Clindamycin/lincomycin, Metoprolol, Penicillins, Pineapple, Toradol [ketorolac tromethamine], Tramadol, Vicodin [hydrocodone-acetaminophen], Fentanyl, and Wellbutrin [bupropion]   Social History:  The patient  reports that he has been smoking cigarettes. He has a 7.5 pack-year smoking history. He has never used smokeless tobacco. He reports that he does not drink alcohol and does not use drugs.   Family History:  The patient's family history includes Heart attack in his father; Hypertension in his mother.  ROS:  Please see the history of present illness. All other systems are reviewed and otherwise negative.   PHYSICAL EXAM:  VS:  There were no vitals taken for this visit. BMI: There is no height or weight on file to calculate BMI. Well nourished, well developed, in no acute distress  HEENT: normocephalic, atraumatic  Neck: no JVD, carotid bruits or masses Cardiac: *** RRR; no significant murmurs, no rubs, or gallops Lungs: ***  CTA b/l, no wheezing, rhonchi or rales  Abd: soft, nontender MS: no deformity or atrophy Ext: *** no edema  Skin: warm  and dry, no rash Neuro:  No gross deficits appreciated Psych: euthymic mood, full affect   EKG:  Done today and reviewed by myself shows  ***    03/19/20: FFR 1. Left Main: 0.99. 2. LAD: Proximal: 0.93.  Distal: 0.84. 3. Ramus intermedius: 0.85. 4. LCX: 0.93. 5. RCA: Proximal: 0.96.  Distal: 0.92. IMPRESSION: 1. CT FFR analysis didn't show any significant stenosis. Aggressive medical management is recommended.  03/19/20: Coronary CT IMPRESSION: 1. Coronary calcium score of 6.5. This was 74 percentile for age and sex matched control. 2. Normal coronary origin with right dominance. 3. CAD-RADS 3. Moderate stenosis in the mid portion of a non-dominant LCX artery and mild stenosis  in the proximal LAD with high risk features. Consider symptom-guided anti-ischemic pharmacotherapy as well as risk factor modification per guideline directed care. Additional analysis with CT FFR will be submitted.    03/12/2016 TTE Conclusions  Summary  Atrial fibrillation noted.  Normal LV systolic function  Ejection fraction is visually estimated at 60-65%.  No LV regional wall motion abnormal.  LV diastolic function is probably normal.  Normal left atrial size.  No significant valvular stenosis or regurgitation.   Recent Labs: No results found for requested labs within last 365 days.  No results found for requested labs within last 365 days.   CrCl cannot be calculated (Patient's most recent lab result is older than the maximum 21 days allowed.).   Wt Readings from Last 3 Encounters:  01/04/23 218 lb (98.9 kg)  09/07/22 215 lb 6.4 oz (97.7 kg)  04/21/22 215 lb (97.5 kg)     Other studies reviewed: Additional studies/records reviewed today include: summarized above  ASSESSMENT AND PLAN:  1. SVT 2. Afib     CHA2DS2Vasc is *** 2     ***  3. HTN     *** Looks good    Disposition: ***    Current medicines are reviewed at length with the patient today.  The patient did not have any concerns regarding medicines.  Norma Fredrickson, PA-C 04/18/2023 7:14 AM     [facility] [address] [place] [address] [phone] (office)  [phone] (fax)

## 2023-04-20 ENCOUNTER — Ambulatory Visit: Payer: BC Managed Care – PPO | Admitting: Physician Assistant

## 2023-05-02 NOTE — Progress Notes (Deleted)
  Electrophysiology Office Note:   Date:  05/02/2023  ID:  BENTLIE MANSKER, DOB 12-Apr-1976, MRN [id number]  Primary Cardiologist: Will Jorja Loa, MD Electrophysiologist: Regan Lemming, MD  {Click to update primary MD,subspecialty MD or APP then REFRESH:1}    History of Present Illness:   Terrance Hodges is a 47 y.o. male with h/o SVT, PAF, DM2, and HTN seen today for routine electrophysiology followup.   Since last being seen in our clinic the patient reports doing ***.  he denies chest pain, palpitations, dyspnea, PND, orthopnea, nausea, vomiting, dizziness, syncope, edema, weight gain, or early satiety.   Review of systems complete and found to be negative unless listed in HPI.   EP Information / Studies Reviewed:    EKG is ordered today. Personal review as below.       Coronary CT 03/2020 1. Left Main: 0.99.   2. LAD: Proximal: 0.93.  Distal: 0.84. 3. Ramus intermedius: 0.85. 4. LCX: 0.93. 5. RCA: Proximal: 0.96.  Distal: 0.92.  Physical Exam:   VS:  There were no vitals taken for this visit.   Wt Readings from Last 3 Encounters:  01/04/23 218 lb (98.9 kg)  09/07/22 215 lb 6.4 oz (97.7 kg)  04/21/22 215 lb (97.5 kg)     GEN: Well nourished, well developed in no acute distress NECK: No JVD; No carotid bruits CARDIAC: {[id number]}, no murmurs, rubs, gallops RESPIRATORY:  Clear to auscultation without rales, wheezing or rhonchi  ABDOMEN: Soft, non-tender, non-distended EXTREMITIES:  No edema; No deformity   ASSESSMENT AND PLAN:    SVT Paroxysmal AF *** symptoms EKG today shows *** on flecainide Continue diltiazem 360 mg daily Continue flecainide 150 mg BID Overall *** with current control  High risk medication monitoring EKG stable on flecainide  {Click here to Review PMH, Prob List, Meds, Allergies, SHx, FHx  :1}   Follow up with Dr. Elberta Fortis in 6 months  Signed, Graciella Freer, PA-C

## 2023-05-03 ENCOUNTER — Ambulatory Visit: Payer: BC Managed Care – PPO | Attending: Student | Admitting: Student

## 2023-05-03 DIAGNOSIS — I471 Supraventricular tachycardia, unspecified: Secondary | ICD-10-CM

## 2023-05-03 DIAGNOSIS — I480 Paroxysmal atrial fibrillation: Secondary | ICD-10-CM

## 2023-05-08 ENCOUNTER — Other Ambulatory Visit: Payer: Self-pay | Admitting: Cardiology

## 2023-09-12 NOTE — Progress Notes (Unsigned)
 This encounter was created in error - please disregard.

## 2023-09-13 ENCOUNTER — Ambulatory Visit: Payer: BC Managed Care – PPO | Admitting: Cardiology

## 2023-10-24 ENCOUNTER — Ambulatory Visit: Admitting: Physician Assistant

## 2023-11-23 NOTE — Progress Notes (Signed)
 Cardiology Office Note Date:  11/23/2023  Patient ID:  Terrance Hodges, Terrance Hodges [birth year], MRN [id number] PCP:  Dorena Fernando HERO, MD  EP:  Dr. Inocencio     Chief Complaint:  annual visit  History of Present Illness: Terrance Hodges is a 48 y.o. male with history of  HTN, DM SVT (responds to valsalva),  AFib.  I saw him 2021, we discussed OAC given recent finding of DM > making his risk score of 2, he was agreeable (with no ongoing hematuria), planned to pursue He reported a number of symptoms, planned for coronary CT, monitoring  --------------------  He saw Dr. 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) (seen on EKG 04/09/2016) AFib (this is unclear) (Both go back go ~ 2017 or earlier) flecainide  started 2017   Past Medical History:  Diagnosis Date   A-fib (HCC)    Diabetes mellitus without complication (HCC)    History of hematuria    Hypertension    Prostatitis    Reflux    SVT (supraventricular tachycardia)     Past Surgical History:  Procedure Laterality Date   ADENOIDECTOMY     KNEE SURGERY     TONSILLECTOMY     URETER SURGERY     WISDOM TOOTH EXTRACTION      Current Outpatient Medications   Medication Sig Dispense Refill   Buprenorphine HCl-Naloxone HCl 8-2 MG FILM Place 1 Film under the tongue in the morning and at bedtime.     carvedilol  (COREG ) 6.25 MG tablet TAKE 1 TABLET BY MOUTH 2 TIMES A DAY 180 tablet 1   dexlansoprazole (DEXILANT) 60 MG capsule Take 60 mg by mouth daily.     diltiazem  (CARDIZEM  CD) 360 MG 24 hr capsule TAKE 1 CAPSULE BY MOUTH DAILY 90 capsule 3   FARXIGA 10 MG TABS tablet Take 10 mg by mouth daily.     fenofibrate micronized (LOFIBRA) 134 MG capsule Take 134 mg by mouth daily before breakfast.     flecainide  (TAMBOCOR ) 150 MG tablet Take 1 tablet (150 mg total) by mouth 2 (two) times daily. 180 tablet 3   rOPINIRole (REQUIP) 1 MG tablet Take 1 mg by mouth at bedtime.     rosuvastatin  (CRESTOR ) 20 MG tablet Take 20 mg by mouth daily.     tamsulosin  (FLOMAX ) 0.4 MG CAPS capsule Take 0.4 mg by mouth daily.     TRULICITY 4.5 MG/0.5ML SOPN Inject 0.5 mLs into the skin once a week.     No current facility-administered medications for this visit.    Allergies:   Clindamycin/lincomycin, Metoprolol , Penicillins, Pineapple, Toradol  [ketorolac  tromethamine ], Tramadol , Vicodin [hydrocodone -acetaminophen ], Fentanyl, and Wellbutrin [bupropion]   Social History:  The patient  reports that he has been smoking cigarettes. He  has a 7.5 pack-year smoking history. He has never used smokeless tobacco. He reports that he does not drink alcohol and does not use drugs.   Family History:  The patient's family history includes Heart attack in his father; Hypertension in his mother.  ROS:  Please see the history of present illness. All other systems are reviewed and otherwise negative.   PHYSICAL EXAM:  VS:  There were no vitals taken for this visit. BMI: There is no height or weight on file to calculate BMI. Well nourished, well developed, in no acute distress  HEENT: normocephalic, atraumatic  Neck: no JVD, carotid bruits or masses Cardiac:  RRR; no significant murmurs,  no rubs, or gallops Lungs: CTA b/l, no wheezing, rhonchi or rales  Abd: soft, nontender MS: no deformity or atrophy Ext: no edema  Skin: warm and dry, no rash Neuro:  No gross deficits appreciated Psych: euthymic mood, full affect   EKG:  Done today and reviewed by myself shows  SR 72bpm, 1st degree AVBlock, IVCD PR  , QRS , QTc  2024: PR , QRS , QTc  Oct 2021, monitor Predominant rhythm was sinus rhythm with sinus tachycardia No atrial fibrillation or SVT noted <1% PVCs Symptoms of tired, fatigue, chest pain associated with sinus rhythm  Coronary CT/FFR 03/20/20 1. Left Main: 0.99. 2. LAD: Proximal: 0.93.  Distal: 0.84. 3. Ramus intermedius: 0.85. 4. LCX: 0.93. 5. RCA: Proximal: 0.96.  Distal: 0.92. IMPRESSION: 1. CT FFR analysis didn't show any significant stenosis. Aggressive medical management is recommended.  IMPRESSION: 1. Coronary calcium  score of 6.5. This was 52 percentile for age and sex matched control.   2. Normal coronary origin with right dominance.   3. CAD-RADS 3. Moderate stenosis in the mid portion of a non-dominant LCX artery and mild stenosis in the proximal LAD with high risk features. Consider symptom-guided anti-ischemic pharmacotherapy as well as risk factor modification per guideline directed care. Additional analysis with CT FFR will be submitted.   IMPRESSION: 1. CT FFR analysis didn't show any significant stenosis. Aggressive medical management is recommended.   03/12/2016 TTE Conclusions  Summary  Atrial fibrillation noted.  Normal LV systolic function  Ejection fraction is visually estimated at 60-65%.  No LV regional wall motion abnormal.  LV diastolic function is probably normal.  Normal left atrial size.  No significant valvular stenosis or regurgitation.   Recent Labs: No results found for requested labs within last 365 days.  No results found for requested labs within last 365 days.    CrCl cannot be calculated (Patient's most recent lab result is older than the maximum 21 days allowed.).   Wt Readings from Last 3 Encounters:  01/04/23 218 lb (98.9 kg)  09/07/22 215 lb 6.4 oz (97.7 kg)  04/21/22 215 lb (97.5 kg)     Other studies reviewed: Additional studies/records reviewed today include: summarized above  ASSESSMENT AND PLAN:  1. SVT No symptoms   2. Afib     CHA2DS2Vasc is 2  Chronic flecainide , intervals look pretty stable      As above, suspect his remote dx of AFlutter (or fib) was a consequence of his SVT Not an independent rhythm  Long discussion today with the patient Given no  evidence of AFib since 2017 > in shared decision, will not start OAC  He is having some short lived palpitations that are new Will plan 2 week monitor to evaluate further  3. HTN     Looks good  Disposition: F/u with us  in 74mo mo, sooner if needed    Current medicines are reviewed at length with the patient today.  The patient did not have any concerns regarding medicines.  Bonney Charlies Arthur, PA-C 11/23/2023 7:15 AM     [facility] [address] [place] [address] [phone] (office)  [phone] (fax)

## 2023-11-24 ENCOUNTER — Encounter: Payer: Self-pay | Admitting: Physician Assistant

## 2023-11-24 ENCOUNTER — Ambulatory Visit: Payer: PRIVATE HEALTH INSURANCE

## 2023-11-24 ENCOUNTER — Ambulatory Visit: Payer: PRIVATE HEALTH INSURANCE | Attending: Physician Assistant | Admitting: Physician Assistant

## 2023-11-24 VITALS — BP 116/64 | HR 72 | Ht 70.0 in | Wt 208.0 lb

## 2023-11-24 DIAGNOSIS — R002 Palpitations: Secondary | ICD-10-CM

## 2023-11-24 DIAGNOSIS — I471 Supraventricular tachycardia, unspecified: Secondary | ICD-10-CM

## 2023-11-24 DIAGNOSIS — Z5181 Encounter for therapeutic drug level monitoring: Secondary | ICD-10-CM

## 2023-11-24 DIAGNOSIS — Z79899 Other long term (current) drug therapy: Secondary | ICD-10-CM

## 2023-11-24 DIAGNOSIS — I10 Essential (primary) hypertension: Secondary | ICD-10-CM

## 2023-11-24 DIAGNOSIS — I480 Paroxysmal atrial fibrillation: Secondary | ICD-10-CM | POA: Diagnosis not present

## 2023-11-24 MED ORDER — FLECAINIDE ACETATE 150 MG PO TABS
150.0000 mg | ORAL_TABLET | Freq: Two times a day (BID) | ORAL | 3 refills | Status: AC
Start: 2023-11-24 — End: ?

## 2023-11-24 MED ORDER — DILTIAZEM HCL ER COATED BEADS 360 MG PO CP24: 360.0000 mg | ORAL_CAPSULE | Freq: Every day | ORAL | 3 refills | Status: DC

## 2023-11-24 MED ORDER — CARVEDILOL 6.25 MG PO TABS: 6.2500 mg | ORAL_TABLET | Freq: Two times a day (BID) | ORAL | 3 refills | Status: AC

## 2023-11-24 NOTE — Progress Notes (Unsigned)
Enrolled patient for a 14 day Zio XT monitor to be mailed to patients home  ° °[place] to read °

## 2023-11-24 NOTE — Patient Instructions (Addendum)
 Medication Instructions:    Your physician recommends that you continue on your current medications as directed. Please refer to the Current Medication list given to you today.   *If you need a refill on your cardiac medications before your next appointment, please call your pharmacy*   Lab Work: NONE ORDERED  TODAY    If you have labs (blood work) drawn today and your tests are completely normal, you will receive your results only by: MyChart Message (if you have MyChart) OR A paper copy in the mail If you have any lab test that is abnormal or we need to change your treatment, we will call you to review the results.   Testing/Procedures: Your physician has recommended that you wear an event monitor. Event monitors are medical devices that record the heart's electrical activity. Doctors most often us  these monitors to diagnose arrhythmias. Arrhythmias are problems with the speed or rhythm of the heartbeat. The monitor is a small, portable device. You can wear one while you do your normal daily activities. This is usually used to diagnose what is causing palpitations/syncope (passing out).     Follow-Up: At [facility], you and your health needs are our priority.  As part of our continuing mission to provide you with exceptional heart care, our providers are all part of one team.  This team includes your primary Cardiologist (physician) and Advanced Practice Providers or APPs (Physician Assistants and Nurse Practitioners) who all work together to provide you with the care you need, when you need it.  Your next appointment:    4 month(s) ( CONTACT  CASSIE Sizelove/ ANGELINE HAMMER FOR EP SCHEDULING ISSUES )   Provider:    You may see Mertha Abrahams, PA-C   We recommend signing up for the patient portal called MyChart.  Sign up information is provided on this After Visit Summary.  MyChart is used to connect with patients for Virtual Visits (Telemedicine).  Patients are able to  view lab/test results, encounter notes, upcoming appointments, etc.  Non-urgent messages can be sent to your provider as well.   To learn more about what you can do with MyChart, go to [email or web address].   Other Instructions  ZIO XT- Long Term Monitor Instructions  Your physician has requested you wear a ZIO patch monitor for 14 days.  This is a single patch monitor. [employer] supplies one patch monitor per enrollment. Additional stickers are not available. Please do not apply patch if you will be having a Nuclear Stress Test,  Echocardiogram, Cardiac CT, MRI, or Chest Xray during the period you would be wearing the  monitor. The patch cannot be worn during these tests. You cannot remove and re-apply the  ZIO XT patch monitor.  Your ZIO patch monitor will be mailed 3 day USPS to your address on file. It may take 3-5 days  to receive your monitor after you have been enrolled.  Once you have received your monitor, please review the enclosed instructions. Your monitor  has already been registered assigning a specific monitor serial # to you.  Billing and Patient Assistance Program Information  We have supplied [employer] with any of your insurance information on file for billing purposes. [employer] offers a sliding scale Patient Assistance Program for patients that do not have  insurance, or whose insurance does not completely cover the cost of the ZIO monitor.  You must apply for the Patient Assistance Program to qualify for this discounted rate.  To apply, please call [employer] at [phone],  select option 4, select option 2, ask to apply for  Patient Assistance Program. Sanna Crystal will ask your household income, and how many people  are in your household. They will quote your out-of-pocket cost based on that information.  [employer] will also be able to set up a 59-month, interest-free payment plan if needed.  Applying the monitor   Shave hair from upper left chest.  Hold abrader disc by  orange tab. Rub abrader in 40 strokes over the upper left chest as  indicated in your monitor instructions.  Clean area with 4 enclosed alcohol pads. Let dry.  Apply patch as indicated in monitor instructions. Patch will be placed under collarbone on left  side of chest with arrow pointing upward.  Rub patch adhesive wings for 2 minutes. Remove white label marked 1. Remove the white  label marked 2. Rub patch adhesive wings for 2 additional minutes.  While looking in a mirror, press and release button in center of patch. A small green light will  flash 3-4 times. This will be your only indicator that the monitor has been turned on.  Do not shower for the first 24 hours. You may shower after the first 24 hours.  Press the button if you feel a symptom. You will hear a small click. Record Date, Time and  Symptom in the Patient Logbook.  When you are ready to remove the patch, follow instructions on the last 2 pages of Patient  Logbook. Stick patch monitor onto the last page of Patient Logbook.  Place Patient Logbook in the blue and white box. Use locking tab on box and tape box closed  securely. The blue and white box has prepaid postage on it. Please place it in the mailbox as  soon as possible. Your physician should have your test results approximately 7 days after the  monitor has been mailed back to [facility].  Call [facility] Customer Care at [phone] if you have questions regarding  your ZIO XT patch monitor. Call them immediately if you see an orange light blinking on your  monitor.  If your monitor falls off in less than 4 days, contact our Monitor department at [phone].  If your monitor becomes loose or falls off after 4 days call [employer] at [phone] for  suggestions on securing your monitor

## 2023-12-31 DIAGNOSIS — R002 Palpitations: Secondary | ICD-10-CM

## 2024-01-02 ENCOUNTER — Ambulatory Visit: Payer: Self-pay | Admitting: Physician Assistant

## 2024-01-04 ENCOUNTER — Other Ambulatory Visit: Payer: Self-pay | Admitting: *Deleted

## 2024-01-04 MED ORDER — DILTIAZEM HCL ER COATED BEADS 120 MG PO CP24: 120.0000 mg | ORAL_CAPSULE | Freq: Every day | ORAL | 3 refills | Status: AC

## 2024-03-21 ENCOUNTER — Emergency Department (HOSPITAL_BASED_OUTPATIENT_CLINIC_OR_DEPARTMENT_OTHER): Payer: PRIVATE HEALTH INSURANCE

## 2024-03-21 ENCOUNTER — Encounter (HOSPITAL_BASED_OUTPATIENT_CLINIC_OR_DEPARTMENT_OTHER): Payer: Self-pay

## 2024-03-21 ENCOUNTER — Other Ambulatory Visit (HOSPITAL_BASED_OUTPATIENT_CLINIC_OR_DEPARTMENT_OTHER): Payer: Self-pay

## 2024-03-21 ENCOUNTER — Emergency Department (HOSPITAL_BASED_OUTPATIENT_CLINIC_OR_DEPARTMENT_OTHER)
Admission: EM | Admit: 2024-03-21 | Discharge: 2024-03-21 | Disposition: A | Discharge status: Home / Self Care | Payer: PRIVATE HEALTH INSURANCE | Attending: Emergency Medicine | Admitting: Emergency Medicine

## 2024-03-21 ENCOUNTER — Other Ambulatory Visit: Payer: Self-pay

## 2024-03-21 DIAGNOSIS — I10 Essential (primary) hypertension: Secondary | ICD-10-CM | POA: Insufficient documentation

## 2024-03-21 DIAGNOSIS — R079 Chest pain, unspecified: Secondary | ICD-10-CM | POA: Diagnosis present

## 2024-03-21 DIAGNOSIS — Z7984 Long term (current) use of oral hypoglycemic drugs: Secondary | ICD-10-CM | POA: Insufficient documentation

## 2024-03-21 DIAGNOSIS — E1165 Type 2 diabetes mellitus with hyperglycemia: Secondary | ICD-10-CM | POA: Diagnosis not present

## 2024-03-21 DIAGNOSIS — Z794 Long term (current) use of insulin: Secondary | ICD-10-CM | POA: Diagnosis not present

## 2024-03-21 DIAGNOSIS — R0789 Other chest pain: Secondary | ICD-10-CM

## 2024-03-21 DIAGNOSIS — Z79899 Other long term (current) drug therapy: Secondary | ICD-10-CM | POA: Insufficient documentation

## 2024-03-21 DIAGNOSIS — J9811 Atelectasis: Secondary | ICD-10-CM | POA: Diagnosis not present

## 2024-03-21 LAB — CBC WITH DIFFERENTIAL/PLATELET
Abs Immature Granulocytes: 0.02 K/uL (ref 0.00–0.07)
Basophils Absolute: 0 K/uL (ref 0.0–0.1)
Basophils Relative: 0 %
Eosinophils Absolute: 0.1 K/uL (ref 0.0–0.5)
Eosinophils Relative: 1 %
HCT: 42.7 % (ref 39.0–52.0)
Hemoglobin: 15 g/dL (ref 13.0–17.0)
Immature Granulocytes: 0 %
Lymphocytes Relative: 25 %
Lymphs Abs: 1.5 K/uL (ref 0.7–4.0)
MCH: 32.3 pg (ref 26.0–34.0)
MCHC: 35.1 g/dL (ref 30.0–36.0)
MCV: 91.8 fL (ref 80.0–100.0)
Monocytes Absolute: 0.4 K/uL (ref 0.1–1.0)
Monocytes Relative: 7 %
Neutro Abs: 4.1 K/uL (ref 1.7–7.7)
Neutrophils Relative %: 67 %
Platelets: 220 K/uL (ref 150–400)
RBC: 4.65 MIL/uL (ref 4.22–5.81)
RDW: 12.7 % (ref 11.5–15.5)
WBC: 6 K/uL (ref 4.0–10.5)
nRBC: 0 % (ref 0.0–0.2)

## 2024-03-21 LAB — TROPONIN T, HIGH SENSITIVITY: Troponin T High Sensitivity: <15 ng/L (ref 0–19)

## 2024-03-21 LAB — BASIC METABOLIC PANEL WITH GFR
Anion gap: 13 (ref 5–15)
BUN: 11 mg/dL (ref 6–20)
CO2: 22 mmol/L (ref 22–32)
Calcium: 8.7 mg/dL — ABNORMAL LOW (ref 8.9–10.3)
Chloride: 103 mmol/L (ref 98–111)
Creatinine, Ser: 1.31 mg/dL — ABNORMAL HIGH (ref 0.61–1.24)
GFR, Estimated: >60 mL/min (ref >60)
Glucose, Bld: 176 mg/dL — ABNORMAL HIGH (ref 70–99)
Potassium: 3.6 mmol/L (ref 3.5–5.1)
Sodium: 138 mmol/L (ref 135–145)

## 2024-03-21 MED ORDER — OXYCODONE-ACETAMINOPHEN 5-325 MG PO TABS
1.0000 | ORAL_TABLET | Freq: Four times a day (QID) | ORAL | 0 refills | Status: AC | PRN
  Filled 2024-03-21: qty 6, 2d supply, fill #0

## 2024-03-21 MED ORDER — METHOCARBAMOL 500 MG PO TABS
500.0000 mg | ORAL_TABLET | Freq: Once | ORAL | Status: AC
  Administered 2024-03-21: 500 mg via ORAL
  Filled 2024-03-21: qty 1

## 2024-03-21 MED ORDER — OXYCODONE-ACETAMINOPHEN 5-325 MG PO TABS
1.0000 | ORAL_TABLET | Freq: Once | ORAL | Status: AC
  Administered 2024-03-21: 1 via ORAL
  Filled 2024-03-21: qty 1

## 2024-03-21 MED ORDER — METHOCARBAMOL 500 MG PO TABS: 500.0000 mg | ORAL_TABLET | Freq: Two times a day (BID) | ORAL | 0 refills | Status: DC

## 2024-03-21 MED ORDER — OXYCODONE-ACETAMINOPHEN 5-325 MG PO TABS: 1.0000 | ORAL_TABLET | Freq: Four times a day (QID) | ORAL | 0 refills | Status: DC | PRN

## 2024-03-21 MED ORDER — IOHEXOL 300 MG/ML  SOLN
75.0000 mL | Freq: Once | INTRAMUSCULAR | Status: AC | PRN
  Administered 2024-03-21: 75 mL via INTRAVENOUS

## 2024-03-21 MED ORDER — MORPHINE SULFATE (PF) 4 MG/ML IV SOLN
4.0000 mg | Freq: Once | INTRAVENOUS | Status: AC
  Administered 2024-03-21: 4 mg via INTRAVENOUS
  Filled 2024-03-21: qty 1

## 2024-03-21 MED ORDER — METHOCARBAMOL 500 MG PO TABS
500.0000 mg | ORAL_TABLET | Freq: Two times a day (BID) | ORAL | 0 refills | Status: AC
  Filled 2024-03-21: qty 20, 10d supply, fill #0
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
## 2024-03-21 NOTE — Discharge Instructions (Signed)
 It was a pleasure taking care of you today.  As discussed, your CT imaging did not show any injuries.  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  Please follow-up with PCP for further evaluation.  I am sending you home with pain medication and a muscle relaxer.  Take as needed for pain.  Medications can cause drowsiness so do not drive or operate machinery while on the medication.  Return to the ER for any worsening symptoms.
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
